# Patient Record
Sex: Male | Born: 1960
Health system: Southern US, Community
[De-identification: ages and names within clinical notes are randomized; demographics above are authoritative.]

## PROBLEM LIST (undated history)

## (undated) DIAGNOSIS — Z8719 Personal history of other diseases of the digestive system: Secondary | ICD-10-CM

## (undated) DIAGNOSIS — E782 Mixed hyperlipidemia: Secondary | ICD-10-CM

## (undated) DIAGNOSIS — E669 Obesity, unspecified: Secondary | ICD-10-CM

## (undated) DIAGNOSIS — Z9289 Personal history of other medical treatment: Secondary | ICD-10-CM

## (undated) DIAGNOSIS — E119 Type 2 diabetes mellitus without complications: Secondary | ICD-10-CM

## (undated) DIAGNOSIS — E8881 Metabolic syndrome: Secondary | ICD-10-CM

## (undated) DIAGNOSIS — G4733 Obstructive sleep apnea (adult) (pediatric): Secondary | ICD-10-CM

## (undated) DIAGNOSIS — I2510 Atherosclerotic heart disease of native coronary artery without angina pectoris: Secondary | ICD-10-CM

## (undated) DIAGNOSIS — M109 Gout, unspecified: Secondary | ICD-10-CM

## (undated) DIAGNOSIS — M545 Low back pain, unspecified: Secondary | ICD-10-CM

## (undated) DIAGNOSIS — F329 Major depressive disorder, single episode, unspecified: Secondary | ICD-10-CM

## (undated) DIAGNOSIS — G8929 Other chronic pain: Secondary | ICD-10-CM

## (undated) DIAGNOSIS — I10 Essential (primary) hypertension: Secondary | ICD-10-CM

## (undated) DIAGNOSIS — M1990 Unspecified osteoarthritis, unspecified site: Secondary | ICD-10-CM

## (undated) DIAGNOSIS — F32A Depression, unspecified: Secondary | ICD-10-CM

## (undated) HISTORY — PX: FUNCTIONAL ENDOSCOPIC SINUS SURGERY: SUR616

## (undated) HISTORY — DX: Personal history of other diseases of the digestive system: Z87.19

## (undated) HISTORY — DX: Obesity, unspecified: E66.9

## (undated) HISTORY — PX: SHOULDER ARTHROSCOPY: SHX128

## (undated) HISTORY — PX: CARPAL TUNNEL RELEASE: SHX101

## (undated) HISTORY — DX: Obstructive sleep apnea (adult) (pediatric): G47.33

## (undated) SURGERY — Surgical Case
Anesthesia: *Unknown

---

## 1998-02-28 ENCOUNTER — Ambulatory Visit (HOSPITAL_BASED_OUTPATIENT_CLINIC_OR_DEPARTMENT_OTHER): Admission: RE | Admit: 1998-02-28 | Discharge: 1998-02-28 | Payer: Self-pay | Admitting: Orthopaedic Surgery

## 1998-05-14 ENCOUNTER — Emergency Department (HOSPITAL_COMMUNITY): Admission: EM | Admit: 1998-05-14 | Discharge: 1998-05-14 | Payer: Self-pay | Admitting: Emergency Medicine

## 1999-05-18 HISTORY — PX: KNEE ARTHROSCOPY: SHX127

## 2001-09-22 ENCOUNTER — Emergency Department (HOSPITAL_COMMUNITY): Admission: EM | Admit: 2001-09-22 | Discharge: 2001-09-22 | Payer: Self-pay | Admitting: *Deleted

## 2001-09-30 ENCOUNTER — Ambulatory Visit (HOSPITAL_COMMUNITY): Admission: RE | Admit: 2001-09-30 | Discharge: 2001-09-30 | Payer: Self-pay | Admitting: Internal Medicine

## 2001-10-29 ENCOUNTER — Encounter: Admission: RE | Admit: 2001-10-29 | Discharge: 2001-10-29 | Payer: Self-pay | Admitting: Otolaryngology

## 2001-10-29 ENCOUNTER — Encounter: Payer: Self-pay | Admitting: Otolaryngology

## 2001-11-13 ENCOUNTER — Encounter (INDEPENDENT_AMBULATORY_CARE_PROVIDER_SITE_OTHER): Payer: Self-pay | Admitting: Specialist

## 2001-11-13 ENCOUNTER — Ambulatory Visit (HOSPITAL_BASED_OUTPATIENT_CLINIC_OR_DEPARTMENT_OTHER): Admission: RE | Admit: 2001-11-13 | Discharge: 2001-11-13 | Payer: Self-pay | Admitting: Otolaryngology

## 2002-05-17 ENCOUNTER — Encounter: Payer: Self-pay | Admitting: Emergency Medicine

## 2002-05-17 ENCOUNTER — Emergency Department (HOSPITAL_COMMUNITY): Admission: EM | Admit: 2002-05-17 | Discharge: 2002-05-18 | Payer: Self-pay | Admitting: Emergency Medicine

## 2003-08-29 ENCOUNTER — Ambulatory Visit (HOSPITAL_BASED_OUTPATIENT_CLINIC_OR_DEPARTMENT_OTHER): Admission: RE | Admit: 2003-08-29 | Discharge: 2003-08-29 | Payer: Self-pay | Admitting: Orthopedic Surgery

## 2003-08-29 ENCOUNTER — Ambulatory Visit (HOSPITAL_COMMUNITY): Admission: RE | Admit: 2003-08-29 | Discharge: 2003-08-29 | Payer: Self-pay | Admitting: Orthopedic Surgery

## 2004-05-02 ENCOUNTER — Inpatient Hospital Stay (HOSPITAL_COMMUNITY): Admission: EM | Admit: 2004-05-02 | Discharge: 2004-05-11 | Payer: Self-pay | Admitting: Emergency Medicine

## 2004-05-04 ENCOUNTER — Encounter (INDEPENDENT_AMBULATORY_CARE_PROVIDER_SITE_OTHER): Payer: Self-pay | Admitting: Specialist

## 2004-05-08 ENCOUNTER — Ambulatory Visit: Payer: Self-pay | Admitting: Infectious Diseases

## 2004-05-14 ENCOUNTER — Ambulatory Visit (HOSPITAL_COMMUNITY): Admission: RE | Admit: 2004-05-14 | Discharge: 2004-05-14 | Payer: Self-pay | Admitting: General Surgery

## 2004-12-18 ENCOUNTER — Ambulatory Visit (HOSPITAL_BASED_OUTPATIENT_CLINIC_OR_DEPARTMENT_OTHER): Admission: RE | Admit: 2004-12-18 | Discharge: 2004-12-18 | Payer: Self-pay | Admitting: Orthopaedic Surgery

## 2005-08-23 ENCOUNTER — Ambulatory Visit (HOSPITAL_BASED_OUTPATIENT_CLINIC_OR_DEPARTMENT_OTHER): Admission: RE | Admit: 2005-08-23 | Discharge: 2005-08-23 | Payer: Self-pay | Admitting: Otolaryngology

## 2005-08-23 ENCOUNTER — Ambulatory Visit (HOSPITAL_COMMUNITY): Admission: RE | Admit: 2005-08-23 | Discharge: 2005-08-23 | Payer: Self-pay | Admitting: Otolaryngology

## 2005-08-23 ENCOUNTER — Encounter (INDEPENDENT_AMBULATORY_CARE_PROVIDER_SITE_OTHER): Payer: Self-pay | Admitting: Specialist

## 2006-05-17 HISTORY — PX: LUMBAR LAMINECTOMY: SHX95

## 2006-05-20 ENCOUNTER — Ambulatory Visit (HOSPITAL_COMMUNITY): Admission: RE | Admit: 2006-05-20 | Discharge: 2006-05-21 | Payer: Self-pay | Admitting: Orthopedic Surgery

## 2006-05-20 ENCOUNTER — Encounter (INDEPENDENT_AMBULATORY_CARE_PROVIDER_SITE_OTHER): Payer: Self-pay | Admitting: Specialist

## 2006-06-26 ENCOUNTER — Inpatient Hospital Stay (HOSPITAL_COMMUNITY): Admission: AD | Admit: 2006-06-26 | Discharge: 2006-07-03 | Payer: Self-pay | Admitting: Internal Medicine

## 2006-10-28 ENCOUNTER — Ambulatory Visit (HOSPITAL_BASED_OUTPATIENT_CLINIC_OR_DEPARTMENT_OTHER): Admission: RE | Admit: 2006-10-28 | Discharge: 2006-10-28 | Payer: Self-pay | Admitting: Orthopaedic Surgery

## 2007-04-01 ENCOUNTER — Encounter: Admission: RE | Admit: 2007-04-01 | Discharge: 2007-04-02 | Payer: Self-pay | Admitting: Endocrinology

## 2007-08-19 ENCOUNTER — Encounter: Admission: RE | Admit: 2007-08-19 | Discharge: 2007-08-19 | Payer: Self-pay | Admitting: Orthopedic Surgery

## 2007-08-23 ENCOUNTER — Encounter: Admission: RE | Admit: 2007-08-23 | Discharge: 2007-08-23 | Payer: Self-pay | Admitting: Orthopedic Surgery

## 2007-09-04 ENCOUNTER — Encounter: Admission: RE | Admit: 2007-09-04 | Discharge: 2007-09-04 | Payer: Self-pay | Admitting: Orthopedic Surgery

## 2007-09-07 ENCOUNTER — Inpatient Hospital Stay (HOSPITAL_COMMUNITY): Admission: EM | Admit: 2007-09-07 | Discharge: 2007-09-11 | Payer: Self-pay | Admitting: Emergency Medicine

## 2007-10-22 ENCOUNTER — Inpatient Hospital Stay (HOSPITAL_COMMUNITY): Admission: AD | Admit: 2007-10-22 | Discharge: 2007-10-28 | Payer: Self-pay | Admitting: Endocrinology

## 2009-06-14 ENCOUNTER — Encounter: Admission: RE | Admit: 2009-06-14 | Discharge: 2009-06-14 | Payer: Self-pay | Admitting: *Deleted

## 2009-07-17 HISTORY — PX: POSTERIOR LUMBAR FUSION: SHX6036

## 2009-07-26 ENCOUNTER — Inpatient Hospital Stay (HOSPITAL_COMMUNITY): Admission: RE | Admit: 2009-07-26 | Discharge: 2009-07-29 | Payer: Self-pay | Admitting: *Deleted

## 2009-07-26 ENCOUNTER — Ambulatory Visit: Payer: Self-pay | Admitting: Cardiovascular Disease

## 2010-08-27 ENCOUNTER — Encounter
Admission: RE | Admit: 2010-08-27 | Discharge: 2010-08-27 | Payer: Self-pay | Source: Home / Self Care | Attending: Orthopedic Surgery | Admitting: Orthopedic Surgery

## 2010-12-19 LAB — CBC
HCT: 34.6 % — ABNORMAL LOW (ref 39.0–52.0)
HCT: 34.7 % — ABNORMAL LOW (ref 39.0–52.0)
HCT: 42 % (ref 39.0–52.0)
Hemoglobin: 12.3 g/dL — ABNORMAL LOW (ref 13.0–17.0)
Hemoglobin: 12.3 g/dL — ABNORMAL LOW (ref 13.0–17.0)
Hemoglobin: 15.3 g/dL (ref 13.0–17.0)
MCHC: 35.4 g/dL (ref 30.0–36.0)
MCHC: 35.5 g/dL (ref 30.0–36.0)
MCHC: 36.5 g/dL — ABNORMAL HIGH (ref 30.0–36.0)
MCV: 89.5 fL (ref 78.0–100.0)
MCV: 90 fL (ref 78.0–100.0)
MCV: 90.8 fL (ref 78.0–100.0)
Platelets: 156 10*3/uL (ref 150–400)
Platelets: 163 10*3/uL (ref 150–400)
Platelets: 203 10*3/uL (ref 150–400)
RBC: 3.83 MIL/uL — ABNORMAL LOW (ref 4.22–5.81)
RBC: 3.84 MIL/uL — ABNORMAL LOW (ref 4.22–5.81)
RBC: 4.69 MIL/uL (ref 4.22–5.81)
RDW: 13.5 % (ref 11.5–15.5)
RDW: 13.5 % (ref 11.5–15.5)
RDW: 13.7 % (ref 11.5–15.5)
WBC: 11.7 10*3/uL — ABNORMAL HIGH (ref 4.0–10.5)
WBC: 8.8 10*3/uL (ref 4.0–10.5)
WBC: 9.8 10*3/uL (ref 4.0–10.5)

## 2010-12-19 LAB — URINE MICROSCOPIC-ADD ON

## 2010-12-19 LAB — CARDIAC PANEL(CRET KIN+CKTOT+MB+TROPI)
CK, MB: 39.9 ng/mL — ABNORMAL HIGH (ref 0.3–4.0)
CK, MB: 52.7 ng/mL — ABNORMAL HIGH (ref 0.3–4.0)
CK, MB: 67.1 ng/mL — ABNORMAL HIGH (ref 0.3–4.0)
Relative Index: 0.5 (ref 0.0–2.5)
Relative Index: 0.5 (ref 0.0–2.5)
Relative Index: 0.7 (ref 0.0–2.5)
Total CK: 10477 U/L — ABNORMAL HIGH (ref 7–232)
Total CK: 8235 U/L — ABNORMAL HIGH (ref 7–232)
Total CK: 9882 U/L — ABNORMAL HIGH (ref 7–232)
Troponin I: 0.02 ng/mL (ref 0.00–0.06)
Troponin I: 0.03 ng/mL (ref 0.00–0.06)
Troponin I: 0.03 ng/mL (ref 0.00–0.06)

## 2010-12-19 LAB — POCT I-STAT 4, (NA,K, GLUC, HGB,HCT)
Glucose, Bld: 219 mg/dL — ABNORMAL HIGH (ref 70–99)
Glucose, Bld: 231 mg/dL — ABNORMAL HIGH (ref 70–99)
HCT: 32 % — ABNORMAL LOW (ref 39.0–52.0)
HCT: 33 % — ABNORMAL LOW (ref 39.0–52.0)
Hemoglobin: 10.9 g/dL — ABNORMAL LOW (ref 13.0–17.0)
Hemoglobin: 11.2 g/dL — ABNORMAL LOW (ref 13.0–17.0)
Potassium: 4.7 mEq/L (ref 3.5–5.1)
Potassium: 4.9 mEq/L (ref 3.5–5.1)
Sodium: 133 mEq/L — ABNORMAL LOW (ref 135–145)
Sodium: 134 mEq/L — ABNORMAL LOW (ref 135–145)

## 2010-12-19 LAB — COMPREHENSIVE METABOLIC PANEL
ALT: 43 U/L (ref 0–53)
AST: 35 U/L (ref 0–37)
Albumin: 3.5 g/dL (ref 3.5–5.2)
Alkaline Phosphatase: 45 U/L (ref 39–117)
BUN: 16 mg/dL (ref 6–23)
CO2: 24 mEq/L (ref 19–32)
Calcium: 9.4 mg/dL (ref 8.4–10.5)
Chloride: 105 mEq/L (ref 96–112)
Creatinine, Ser: 0.89 mg/dL (ref 0.4–1.5)
GFR calc Af Amer: >60 mL/min (ref >60)
GFR calc non Af Amer: >60 mL/min (ref >60)
Glucose, Bld: 172 mg/dL — ABNORMAL HIGH (ref 70–99)
Potassium: 4.2 mEq/L (ref 3.5–5.1)
Sodium: 139 mEq/L (ref 135–145)
Total Bilirubin: 0.7 mg/dL (ref 0.3–1.2)
Total Protein: 6.7 g/dL (ref 6.0–8.3)

## 2010-12-19 LAB — GLUCOSE, CAPILLARY
Glucose-Capillary: 104 mg/dL — ABNORMAL HIGH (ref 70–99)
Glucose-Capillary: 135 mg/dL — ABNORMAL HIGH (ref 70–99)
Glucose-Capillary: 136 mg/dL — ABNORMAL HIGH (ref 70–99)
Glucose-Capillary: 151 mg/dL — ABNORMAL HIGH (ref 70–99)
Glucose-Capillary: 155 mg/dL — ABNORMAL HIGH (ref 70–99)
Glucose-Capillary: 164 mg/dL — ABNORMAL HIGH (ref 70–99)
Glucose-Capillary: 172 mg/dL — ABNORMAL HIGH (ref 70–99)
Glucose-Capillary: 175 mg/dL — ABNORMAL HIGH (ref 70–99)
Glucose-Capillary: 179 mg/dL — ABNORMAL HIGH (ref 70–99)
Glucose-Capillary: 188 mg/dL — ABNORMAL HIGH (ref 70–99)
Glucose-Capillary: 194 mg/dL — ABNORMAL HIGH (ref 70–99)
Glucose-Capillary: 195 mg/dL — ABNORMAL HIGH (ref 70–99)
Glucose-Capillary: 198 mg/dL — ABNORMAL HIGH (ref 70–99)
Glucose-Capillary: 207 mg/dL — ABNORMAL HIGH (ref 70–99)
Glucose-Capillary: 210 mg/dL — ABNORMAL HIGH (ref 70–99)
Glucose-Capillary: 216 mg/dL — ABNORMAL HIGH (ref 70–99)
Glucose-Capillary: 226 mg/dL — ABNORMAL HIGH (ref 70–99)
Glucose-Capillary: 260 mg/dL — ABNORMAL HIGH (ref 70–99)

## 2010-12-19 LAB — URINALYSIS, ROUTINE W REFLEX MICROSCOPIC
Bilirubin Urine: NEGATIVE
Glucose, UA: NEGATIVE mg/dL
Hgb urine dipstick: NEGATIVE
Ketones, ur: NEGATIVE mg/dL
Leukocytes, UA: NEGATIVE
Nitrite: NEGATIVE
Protein, ur: 100 mg/dL — AB
Specific Gravity, Urine: 1.019 (ref 1.005–1.030)
Urobilinogen, UA: 0.2 mg/dL (ref 0.0–1.0)
pH: 5 (ref 5.0–8.0)

## 2010-12-19 LAB — DIFFERENTIAL
Basophils Absolute: 0.1 10*3/uL (ref 0.0–0.1)
Basophils Relative: 1 % (ref 0–1)
Eosinophils Absolute: 0.1 10*3/uL (ref 0.0–0.7)
Eosinophils Relative: 1 % (ref 0–5)
Lymphocytes Relative: 31 % (ref 12–46)
Lymphs Abs: 2.7 10*3/uL (ref 0.7–4.0)
Monocytes Absolute: 0.7 10*3/uL (ref 0.1–1.0)
Monocytes Relative: 7 % (ref 3–12)
Neutro Abs: 5.2 10*3/uL (ref 1.7–7.7)
Neutrophils Relative %: 60 % (ref 43–77)

## 2010-12-19 LAB — PROTIME-INR
INR: 0.99 (ref 0.00–1.49)
Prothrombin Time: 13 seconds (ref 11.6–15.2)

## 2010-12-19 LAB — BASIC METABOLIC PANEL
BUN: 14 mg/dL (ref 6–23)
CO2: 27 mEq/L (ref 19–32)
Calcium: 7.4 mg/dL — ABNORMAL LOW (ref 8.4–10.5)
Chloride: 104 mEq/L (ref 96–112)
Creatinine, Ser: 0.82 mg/dL (ref 0.4–1.5)
GFR calc Af Amer: >60 mL/min (ref >60)
GFR calc non Af Amer: >60 mL/min (ref >60)
Glucose, Bld: 164 mg/dL — ABNORMAL HIGH (ref 70–99)
Potassium: 3.9 mEq/L (ref 3.5–5.1)
Sodium: 139 mEq/L (ref 135–145)

## 2010-12-19 LAB — ABO/RH: ABO/RH(D): A NEG

## 2010-12-19 LAB — APTT: aPTT: 28 seconds (ref 24–37)

## 2010-12-19 LAB — TYPE AND SCREEN
ABO/RH(D): A NEG
Antibody Screen: NEGATIVE

## 2011-01-29 NOTE — Consult Note (Signed)
Alec [name], OKANE NO.:  [id number]   MEDICAL RECORD NO.:  [id number]          PATIENT TYPE:  INP   LOCATION:  6703                         FACILITY:  [facility]   PHYSICIAN:  Graylin Shiver, M.D.   DATE OF BIRTH:  May 18, 1961   DATE OF CONSULTATION:  09/08/2007  DATE OF DISCHARGE:                                 CONSULTATION   REASON FOR CONSULTATION:  The patient is a 50 year old male admitted to  the hospital because of abdominal pain, some nausea and vomiting.  He  went to the emergency room where he was evaluated and a CT scan showed  that he had acute pancreatitis with probable secondary inflammatory  changes in the duodenum. The patient denied drinking alcohol on a  regular basis.  His last consumption of alcohol was 2 months ago. He  gives no history of gallstones.  He has had similar presentations in the  past with pancreatitis and of note also is that his triglyceride level  on this presentation to the emergency room was 5000.   He had a similar episode in 2007 and also in 2005.   The patient has history of type 2 diabetes mellitus, hyperlipidemia,  gout, hypertension, obesity. He has been on Tricor for his  hyperlipidemia.   ALLERGIES:  None known.   MEDICATIONS PRIOR TO ADMISSION:  Altace, baby aspirin, allopurinol, fish  oil, Lexapro, Lantus, NovoLog, Tricor, Mobic, Skelaxin.   REVIEW OF SYSTEMS:  No chest pain or shortness of breath.   PHYSICAL EXAMINATION:  He does not appear in any acute distress.  VITAL SIGNS:  Stable.  Nonicteric.  HEART:  Regular rhythm.  No murmurs.  LUNGS:  Clear.  ABDOMEN:  Bowel sounds are normal.  It is soft.  There is tenderness in  the epigastrium and also right upper quadrant.  No rebound or guarding.   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   PLAN:  Supportive care, IV fluids, n.p.o. for now. Will obtain an  abdominal ultrasound just to double check and look for any  evidence of  gallstones.          ______________________________  Graylin Shiver, M.D.    SFG/MEDQ  D:  09/08/2007  T:  09/09/2007  Job:  [id number]   cc:   Geoffry Paradise, M.D.

## 2011-01-29 NOTE — H&P (Signed)
Alec, [name]                ACCOUNT NO.:  [id number]   MEDICAL RECORD NO.:  [id number]          PATIENT TYPE:  INP   LOCATION:  6703                         FACILITY:  [facility]   PHYSICIAN:  Geoffry Paradise, M.D.  DATE OF BIRTH:  06/28/1961   DATE OF ADMISSION:  09/07/2007  DATE OF DISCHARGE:                              HISTORY & PHYSICAL   CHIEF COMPLAINT:  Nausea, vomiting and abdominal pain.   HISTORY OF PRESENT ILLNESS:  [name] is a 50 year old gentleman with  longstanding diabetes mellitus type 2, obesity, hypertension,  hyperlipidemia, gout, presenting at this time with intractable nausea,  vomiting, abdominal pain.  He was last hospitalized in October, 2007  with a similar presentation felt compatible with pancreatitis secondary  to hyperlipidemia.  He evidently has had an extensive GI evaluation in  the past to include endoscopy which was negative.  He, as well, has had  a CT scan in the past which revealed some sort of retroperitoneal  process dating back to 2005.  His last episode quieted down with  analgesics, IV fluids and bowel rest.  He was seen in consultation by  [facility].  Most recently in the office he was seen August 12, 2007 at  which time his hemoglobin A1C was 7.  He has remained with significant  obesity, weight about 248 pounds.  His last lipid status revealed  triglycerides that were still in the 3,000 range and a cholesterol that  was 528;  this, despite aggressive dietary counseling and medications to  include Tricor.  In the emergency room he has had a CT scan and  laboratory testing all compatible with pancreatitis.  He is admitted for  further management.   PAST MEDICAL HISTORY:   ALLERGIES:  No known drug allergies.   MEDICATIONS:  1. Altace 10 mg daily.  2. Baby aspirin 81 mg daily.  3. Allopurinol 300 mg daily.  4. Fish oil daily.  5. Lexapro 10 mg daily.  6. Lantus 25 units subcutaneously q.h.s.  7. NovoLog 8 units subcutaneously  q.a.c.  8. Tricor 145 mg daily.  9. Mobic 15 mg daily.  10.Skelaxin 800 q.i.d.   MEDICAL ILLNESSES:  1. T12 traumatic compression fracture.  2. Diabetes mellitus type 2.  3. Hypertension.  4. Hyperlipidemia.  5. Gout.  6. Diabetic proteinuria.   PAST SURGICAL HISTORY:  1. Prior sinus surgery.  2. Multiple CT scans.  3. Upper GI endoscopy.  4. Multiple back surgeries as well.   FAMILY HISTORY:  Parents with diabetes.  Father with prostate cancer.   SOCIAL HISTORY:  Patient evidently does not smoke or drink.  He is  married.   PHYSICAL EXAMINATION:  VITAL SIGNS:  Temperature is 99.  Blood pressure  122/72. Pulse 90 and regular.  Respiratory rate is 18.  GENERAL APPEARANCE:  The patient is an obese-appearing male in mild  distress.  SKIN:  Warm, non-diaphoretic.  HEENT:  Anicteric.  Extraocular movements intact.  NECK:  No jugular venous distention or bruits.  LUNGS:  Clear to auscultation and percussion.  CARDIOVASCULAR:  Mild tachycardia.  Regular rate  and rhythm.  No  murmurs, rubs or gallops or heaves.  ABDOMEN:  Soft with diffuse upper abdominal tenderness, good bowel  sounds, no rebound.  EXTREMITIES:  No cyanosis, clubbing or edema.  Intact distal pulses.  NEUROLOGICAL:  He is intact.   ASSESSMENT:  1. Abdominal pain with nausea and vomiting, compatible with      triglycerides-related pancreatitis.  Pattern well established.  2. Diabetes mellitus type 2, stable.  3. Hypertension, stable.  4. Hyperlipidemia.  Multiple interventions in the past and failing      dietary interventions.   PLAN:  We will once again admit for bowel rest, IV fluids and  analgesics.  Further pending his course.  As stated above, he has  already had significant GI work-ups in the past with dietary  interventions with little success.  We will continue him on his medical  management and further pending his course.           ______________________________  Geoffry Paradise, M.D.      RA/MEDQ  D:  09/07/2007  T:  09/08/2007  Job:  [id number]

## 2011-01-29 NOTE — Consult Note (Signed)
Alec [name], Alec [name]                ACCOUNT NO.:  [id number]   MEDICAL RECORD NO.:  [id number]          PATIENT TYPE:  INP   LOCATION:  5155                         FACILITY:  [facility]   PHYSICIAN:  John C. Madilyn Fireman, M.D.    DATE OF BIRTH:  Sep 27, 1960   DATE OF CONSULTATION:  10/24/2007  DATE OF DISCHARGE:                                 CONSULTATION   REFERRING PHYSICIAN:  Tera Mater. Evlyn Kanner, M.D.   HISTORY OF ILLNESS:  The patient is a 50 year old white male admitted  for the second time  in 2 months and fourth time in 3 years with stereotypical abdominal  pain, nausea and vomiting with retroperitoneal and peripancreatic  inflammation of various degrees on CT scan. He has had normal amylases  throughout and occasionally mildly elevated lipase no greater than 82  during these presentations but has repeatedly had elevated triglycerides  greater than 3000 or more with presumed diagnosis being  hypertriglyceride related pancreatitis.  He was currently admitted on  February 5 and on this occasion CT scan was described as showing  peripancreatic inflammation around the neck and head with some secondary  inflammatory change around the first, second and third portions of the  duodenum.  Small reactive lymph nodes were seen around the gastrohepatic  ligament.  The liver was diffusely enlarged which has been seen on  previous CT.  The patient has not had any vomiting or fever and his  white blood cell count has been normal.  Liver function tests have  likewise been normal.  He has had gallbladder ultrasounds in the past  which have been negative.  He has been a sporadic alcohol drinker but  denies drinking any at all since his last admission in December.  On  another admission in 2007 he had some secondary inflammation around the  duodenum and an EGD was done to rule out penetrating ulcer that showed  only showed mild gastritis.   PAST MEDICAL HISTORY:  1. Type 2 diabetes.  2. Obesity.  3.  Hypertension.  4. Gout.  5. Hyperlipidemia.   MEDICATIONS:  1. Altace.  2. Baby aspirin.  3. Allopurinol,  4. Lexapro.  5. Lantus insulin.  6. Tricor.  7. Mobic.  8. Skelaxin.   PAST SURGICAL HISTORY:  Previous sinus surgery.   SOCIAL HISTORY:  The patient denies alcohol or tobacco use.   FAMILY HISTORY:  Father has prostate cancer.  Both parents have  diabetes.   PHYSICAL EXAM:  GENERAL:  Moderately obese white male in no acute  distress.  HEART:  Regular rate and rhythm without murmurs.  LUNGS:  Clear.  ABDOMEN:  Soft, slightly distended with normoactive bowel sounds.  No  hepatosplenomegaly, mass or guarding.   IMPRESSION:  Abdominal pain with peripancreatic inflammatory  abnormalities with overall presentation not classic for pancreatitis but  with the presumed etiology for elevated triglycerides.   PLAN:  Continue supportive care for now.  We will screen for autoimmune  hepatitis with serum IgG subtype.  Due to the recurrent nature of these  presentations at this point, I would favor an elective  referral to the  GI department of tertiary care center to see if there is any need for  evaluating pancreatic ductal abnormality invasively with ERCP.  Will  follow with you.           ______________________________  Everardo All. Madilyn Fireman, M.D.     JCH/MEDQ  D:  10/24/2007  T:  10/26/2007  Job:  [id number]   cc:   Jeannett Senior A. Evlyn Kanner, M.D.

## 2011-01-29 NOTE — H&P (Signed)
Alec [name], [name]                ACCOUNT NO.:  [id number]   MEDICAL RECORD NO.:  [id number]          PATIENT TYPE:  INP   LOCATION:  5125                         FACILITY:  [facility]   PHYSICIAN:  Tera Mater. [place], M.D. DATE OF BIRTH:  10-17-1960   DATE OF ADMISSION:  10/22/2007  DATE OF DISCHARGE:                              HISTORY & PHYSICAL   HISTORY:  Alec [name] is a 50 year old white male with a history of  prior pancreatitis, type 2 diabetes, goiter, proteinuria, and  hyperlipidemia.  He started feeling poorly, yesterday, and slept  somewhat intermittently last night.  He had a vague abdominal pain this  morning.  This progressively worsened during the day today.  He did last  eat lunch about 5 hours ago with some left over [place] that did not  specifically it make it much worse, but did not make it any better.  His  bowels were somewhat loose this morning.  He has intermittent trouble  with a shortness of breath due to pain.  The pain does radiate from his  epigastrium back through to his back and between his shoulder blades.  No lower abdominal pain is noted.  No urinary symptoms were noted.  No  cardiac chest pain is noted.  He has no headaches or dizziness.  He has  had no fevers, chills, or sweats.  He has had no feeling of being hot.  He has had no unusual travel or exposures.  He was hospitalized a few  months ago with similar kind of issues.  This is very similar in  presentation to his prior problems.  His blood sugar was 190 at the last  check.   MEDICATIONS INCLUDE:  1. Altace 10.  2. Baby aspirin daily.  3. Allopurinol 300.  4. Fish oil.  5. Lovaza 4 grams a day.  6. Lexapro 10.  7. A sliding-scale of NovoLog 8 units 3x a day.  8. Lantus 28 units at bed.  9. Tricor 145.   ALLERGIES:  None.   PAST MEDICAL HISTORY INCLUDES:  1. A T12 compression fracture that was traumatic.  2. He has had pancreatitis in 2007 and December 2008.  3. He has had type 2  diabetes.  4. Hypertension.  5. Hyperlipidemia.  6. Gout.  7. Proteinuria with workup by renal.  8. He has had left ventricular hypertrophy.  9. A small goiter.  10.He has an unusual retroperitoneal process with multiple CTs in      2005.  11.He had a normal Cardiolite in 2003.  12.An echo in 2003.  13.A CT of the head in 2006.  14.He has previously had knee surgery twice.  15.Sinus surgery and deviated septum.  16.Nasal polyp surgery.  17.He has also had carpal tunnel release on the right.   SOCIAL HISTORY:  The patient is married with two children.  He is a  nonsmoker and not currently drinking at all.   PHYSICAL EXAMINATION:  VITAL SIGNS:  On exam here in the office, blood  pressure is 120/90; pulse is 84 and regular; weight was 243 which is up  1 pound.  Temperature is 99 degrees.  His blood sugar will be reported.  GENERAL:  We have a white male sitting up in at least mild-to-moderate  distress.  He is intermittently grunting with pain spasms.  HEENT:  His sclerae are anicteric.  Extraocular movements are intact.  Face is symmetrical.  Mucous membranes are moist.  No pharyngeal lesions  are present.  NECK:  Supple without JVD or bruits.  I cannot appreciate any  thyromegaly.  LUNGS:  Clear without wheezes, rales or rhonchi.  He is moving most  moving air well.  HEART:  Regular and slightly tachycardic with no murmur.  ABDOMEN:  Quite a bit tender especially in the epigastrium.  Just with  laying the stethoscope he winces.  Lower quadrants are less tender.  There is no rebound in the lower quadrants.  EXTREMITIES:  Show pulses to be strong with no peripheral edema.  NEUROLOGIC EXAM:  He is awake, alert.  His mentation is perfectly well.  Speech is clear.  No resting tremors present.  He is uncomfortable.   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  The clinical history and exam are  quite suggestive of this.   He is going to be a direct admit with an  inpatient status, with a diagnosis of pancreatitis to a medical floor.  We will make him n.p.o.  We will give him saline starting at 125 an hour  until we get out labs back.  He is going to have STAT lipase, amylase,  CMET, CBC, and lipid profile.  We will also check an abdominal CT with  and without contrast, and see if we can delineate this process further.  He will be given morphine IV q.4 h. for pain with Phenergan added.  His  Lexapro will be on hold.  We are probably going to put him on an insulin  drip overnight just trying to bring his lipids down which, I think, are  the proximal cause of this, as they have been previously.  The patient's  clinical course will determine other worse.  At the present, I am not  going to get gastroenterology or surgery involved; since, I think, we  can handle this at the present.           ______________________________  Tera Mater. Evlyn Kanner, M.D.     SAS/MEDQ  D:  10/22/2007  T:  10/23/2007  Job:  [id number]

## 2011-02-01 NOTE — Op Note (Signed)
NAME:  TYCEN, DOCKTER                          ACCOUNT NO.:  [id number]   MEDICAL RECORD NO.:  [id number]                   PATIENT TYPE:  AMB   LOCATION:  DSC                                  FACILITY:  [facility]   PHYSICIAN:  Feliberto Gottron. Turner Daniels, M.D.                DATE OF BIRTH:  02-07-1961   DATE OF PROCEDURE:  08/29/2003  DATE OF DISCHARGE:                                 OPERATIVE REPORT   PREOPERATIVE DIAGNOSIS:  Right knee lateral meniscal tear with catching,  locking, and popping for two months.   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   PROCEDURES:  Right knee arthroscopic debridement of meniscal tears,  chondromalacia, and removal of loose bodies.   SURGEON:  Feliberto Gottron. Turner Daniels, M.D.   FIRST ASSISTANT:  Skip Mayer, P.A.-C.   ANESTHETIC:  General LMA.   ESTIMATED BLOOD LOSS:  Minimal.   FLUID REPLACEMENT:  800 cc of crystalloid.   DRAINS PLACED:  None.   TOURNIQUET TIME:  None.   INDICATIONS FOR PROCEDURE:  The patient is a 50 year old man who had  arthroscopic debridement of his left knee a few years ago and has had  similar problems now with the right knee for a couple of months that  culminated in what sounds like a locked knee a few days ago when he was  getting some Christmas tree lights down.  He has had catching, locking, and  popping for two months prior to that.  When I saw him in the office he was  exquisitely tender along the lateral joint line, had limited motion lacking  full extension of about 10 degrees and flexion was only to about 90 with  little if any effusion.  He was presumed to have a lateral meniscal tear  where he was most tender, although he is tender along the medial side as  well, and we decided to give him a couple of days to see if he got better.  When I saw him in the  office this morning, despite using Vicodin-ES, he  still had significant pain that prevented weightbearing, and he is taken for  arthroscopic evaluation and treatment of his right knee.  X-rays in the  office a few days ago were negative.   DESCRIPTION OF PROCEDURE:  The patient identified by arm band, taken to the  operating room at [facility].  Appropriate anesthetic monitors  were attached, and general LMA anesthesia induced with the patient in the  supine position.  Lateral post applied to the table and the right lower  extremity prepped and draped in the usual sterile fashion from the ankle to  the mid-thigh.  Using a #11 blade, standard inferomedial and inferolateral  parapatellar portals were then made allowing introduction  of the arthroscope  through the inferolateral portal and the outflow through the inferomedial  portal.  Diagnostic arthroscopy revealed grade 3 chondromalacia of the  patella, grade 3-4 of the trochlear, and these were debrided back to stable  margins using a 3.5 Gator sucker shaver and swapping portals during the  process a few times.  Moving into the medial compartment, complex tearing of  the posterior horn of the medial meniscus was noted.  This was removed with  straight biters, large and small, as well as the 3.5 Gator sucker shaver.  Minimal chondromalacia was noted to the tibial plateau on the medial femoral  condyle.  The ACL and the PCL were intact.  There were some cartilaginous  loose bodies taken out during the procedure as well from the trochlear donor  sites.  On the lateral side, degenerative tearing of the anterior and  posterior horns of the lateral meniscus was noted. and this was debrided  back to stable margins near the root.  We probed thoroughly, tugging on the  meniscus to make sure there were no peripheral tears, and the meniscus  overall was intact.  There was some grade 2-3 chondromalacia of the lateral  tibial condyle that  was also debrided.  At this point, the knee was washed  out with normal saline solution.  The arthroscopic instruments were removed,  and a dressing of Xerofoam, 4 x 4 dressing sponges, Webril, and an Ace wrap  applied.  The patient was then awakened and taken to the recovery room  without difficulty.                                               Feliberto Gottron. Turner Daniels, M.D.    Ovid Curd  D:  08/29/2003  T:  08/29/2003  Job:  [id number]

## 2011-02-01 NOTE — Consult Note (Signed)
NAME:  EZELL, POKE NO.:  [id number]   MEDICAL RECORD NO.:  [id number]                   PATIENT TYPE:  INP   LOCATION:  5731                                 FACILITY:  [facility]   PHYSICIAN:  Graylin Shiver, M.D.                DATE OF BIRTH:  [birth year]   DATE OF CONSULTATION:  05/04/2004  DATE OF DISCHARGE:                                   CONSULTATION   REFERRING PHYSICIAN:  Dr. Ollen Gross. Carolynne Edouard.   REASON FOR CONSULTATION:  This patient is a 50 year old male who was  admitted to the hospital on May 02, 2004 by Dr. Carolynne Edouard with complaint of  back pain and abdominal pain.  The patient had been experiencing pain in his  back for a couple of weeks.  The pain then went to the right side of his  abdomen and persisted.  He did not experience nausea or vomiting.  A CT scan  of the abdomen was done, which showed inflammation and stranding around the  duodenum which tracked into the retroperitoneum and down into the pelvic  area; the etiology of this was unclear.  The pancreas appeared normal on the  CT scan.  It was felt that the symptoms could be secondary to peptic ulcer  disease.  He was admitted to the hospital and he continues to have the pain  on the right side of the abdomen.   PAST HISTORY:   ALLERGIES:  PERCOCET.   MEDICATIONS:  He was on a medicine for diabetes and hypercholesterolemia.   PAST MEDICAL HISTORY:  1. Diabetes mellitus.  2. Hypercholesterolemia.  3. Proteinuria.   PREVIOUS SURGERIES:  He has had sinus surgery and knee surgery in the past.   SOCIAL HISTORY:  He does not smoke.  He rarely drinks alcohol.   SYSTEMS REVIEW:  He is not complaining of any anginal chest pains, shortness  of breath, cough or sputum production.   PHYSICAL:  GENERAL:  He does not appear in any acute distress at this time.  SKIN:  He is nonicteric.  NECK:  Neck is supple.  HEART:  Regular rhythm.  No murmurs.  LUNGS:  Lungs are clear.  ABDOMEN:  Bowel  sounds are normal.  It is soft.  There is tenderness in the  epigastrium and also in the right side of the abdomen, starting in the right  upper quadrant and extending down the right side of the abdomen and lateral  right wall of the abdomen.  No obvious hepatosplenomegaly is noted.   LABORATORIES:  White blood cell count 9500, hemoglobin 12.4, hematocrit  35.3, platelets 157,000.  Lipase initially on admission was mildly elevated  at 78.   IMPRESSION:  This patient is a 50 year old male with pain in the epigastric  and right side of his abdomen with findings on CT scan of inflammation and  stranding in the area of the right  anterior pararenal space,  retroperitoneum, and this seems to be extending from the duodenum.  It is  unclear whether this is being caused from peptic ulcer disease.  I suppose  other considerations could be ischemic.   PLAN:  I will proceed with EGD to evaluate the upper GI tract; I think the  patient can tolerate it.  He is tolerating clear liquids.  He is having  bowel movements.  Hopefully, an EGD will help to clarify what might be going  on in the region of the duodenum.  The procedure was explained, along with  the potential risks of bleeding, infection and perforation; he understands  and consents to the procedure.                                               Graylin Shiver, M.D.    SFG/MEDQ  D:  05/04/2004  T:  05/05/2004  Job:  [id number]   cc:   Ollen Gross. Vernell Morgans, M.D.  [address]  [place]  [place] [address]  Fax: [phone]

## 2011-02-01 NOTE — Discharge Summary (Signed)
Alec [name], Alec [name]                ACCOUNT NO.:  [id number]   MEDICAL RECORD NO.:  [id number]          PATIENT TYPE:  INP   LOCATION:  5742                         FACILITY:  [facility]   PHYSICIAN:  Ollen Gross. Vernell Morgans, M.D. DATE OF BIRTH:  10/15/1960   DATE OF ADMISSION:  05/02/2004  DATE OF DISCHARGE:  05/11/2004                                 DISCHARGE SUMMARY   HISTORY OF PRESENT ILLNESS:  [name] is a 50 year old gentleman who  presented initially with right abdominal and back pain.  He underwent a CT  scan that showed a small area of inflammation around the duodenum.  This  inflammation tracked into his retroperitoneum and down into his pelvis.  There was no evidence of free air, and his white count was elevated.  He was  admitted to the hospital with a tentative diagnosis of peptic ulcer disease  with possibly some contained perforation.  He was started on antibiotics and  bowel rest.  A GI consult was obtained.  He was a diabetic and his sugars  were watched closely.  Around May 06, 2004, he was started on a clear  liquid diet which he tolerated.  He was gradually advanced, and a repeat CT  scan was unchanged, if not a little bit improved.  He was gradually advanced  until he was switched to oral antibiotics, did well with this for a day, and  on May 11, 2004 was ready for discharge home.   DISCHARGE MEDICATIONS:  He was sent home on -  1.  Augmentin.  2.  Vicodin for pain.  3.  He was to resume his home medications.   DIET:  As tolerated.  Diabetic diet.   ACTIVITY:  As tolerated.   FOLLOW UP:  With Dr. Carolynne Edouard in 2 weeks.   CONDITION:  Stable.   FINAL DIAGNOSIS:  Retroperitoneal inflammation.   DISPOSITION:  He is discharged home.      Renae Fickle   PST/MEDQ  D:  08/14/2004  T:  08/14/2004  Job:  [id number]

## 2011-02-01 NOTE — H&P (Signed)
[place]. [facility]  Patient:    SIGISMUND, CROSS Visit Number: [id number] MRN: [id number]          Service Type: EMS Location: [facility] Attending Physician:  Corlis Leak. Admit Date:  09/22/2001 Discharge Date: 09/22/2001                           History and Physical  CHIEF COMPLAINT:  Right elbow pyoarthrosis.  HISTORY OF PRESENT ILLNESS:  This 50 year old man with a 4-day history of swelling and pain along the lateral aspect of his right elbow.  It got markedly worse 24 hours prior to admission with shaking night sweats before the day before his admission.  When he presented to the orthopedic clinic, he had a temperature of 100.7. We tapped his elbow and got out some yellowish fluid with an appearance consistent with pus.  This was sent off to the lab for culture, Gram stain, cell count, and crystals.  Unfortunately, the specimen was filed under a slightly different spelling of the name and because of a delay in getting the results of the fluid back he was admitted to the hospital for urgent irrigation and debridement of presumed pyoarthrosis of the elbow.  While he was in the OR preop holding area, we finally got the fluid back, 44,000 white cells with intracellular and extracellular calcium oxalate crystals which, of course, is gout.  At that point he was discharged from the hospital and he never actually went through the operating room.  PAST MEDICAL HISTORY:  He does not smoke.  Has an occasional alcohol.  He does eat red meat.  PREVIOUS SURGICAL HISTORY:  He had a knee injury taken care of by Dr. Jerl Santos some years ago.  MEDICATIONS:  No medications at the time of admission outside of over-the-counter medicines.  ALLERGIES:  No known allergies.  In the past, he has taken Lodine and two weeks ago he was taking some Zithromax for a bronchitis and came off of this four days prior to this admission.  FAMILY HISTORY:  He is employed  and lives in [place] with his wife.  PHYSICAL EXAMINATION:  GENERAL:  A well-nourished, well-developed, 50 year old man.  VITAL SIGNS:  Temperature 100.7, respiratory rate 16, blood pressure 138/75, heart rate 100.  HEENT:  Unremarkable.  NEUROLOGIC:  The right elbow is swollen, erythematous, and hot to touch.  He is neurovascularly intact.  LUNGS:  Clear.  HEART:  Regular rate and rhythm.  ABDOMEN:  Belly soft and nontender.  BACK:  Clinically straight.  EXTREMITIES:  Full range of motion of shoulders, other elbow, wrists, hips, knees, and ankles  LABORATORY:  His hemoglobin is 15.2, hematocrit 43.4, white count 13.5.  Basic metabolic profile was normal except for a chloride which is slightly low at 95.  Again, the fluid analysis came back as the patient was being taken from the preop holding area to the operating room, but he did not make it into the operating room.  ASSESSMENT:  Acute gout attack in a 50 year old man.  PLAN:  He is to be discharged home on Indocin SR 75 mg p.o. b.i.d. to follow up in my office in 2-3 days.  His surgery, of course, is cancelled. Attending Physician:  Corlis Leak DD:  10/10/97 TD:  10/10/97 Job: [id number]

## 2011-02-01 NOTE — Discharge Summary (Signed)
Alec [name], Alec [name]                ACCOUNT NO.:  [id number]   MEDICAL RECORD NO.:  [id number]          PATIENT TYPE:  INP   LOCATION:  5127                         FACILITY:  [facility]   PHYSICIAN:  Tera Mater. Evlyn Kanner, M.D. DATE OF BIRTH:  1960-11-11   DATE OF ADMISSION:  06/26/2006  DATE OF DISCHARGE:  07/03/2006                                 DISCHARGE SUMMARY   DISCHARGE DIAGNOSES:  1. Pancreatitis, presumed secondary to hypertriglyceridemia.  2. Significant hypertriglyceridemia now clinically much improved.  3. Type 2 diabetes with generally fair control at the present time.  4. Depression with medication resumed.  5. Normocytic anemia.  6. Mild hyponatremia, resolved.  7. Mild hypokalemia, improved.   CONSULTATIONS:  Dr. Vida Rigger of gastroenterology.   PROCEDURES:  Included a CT of the abdomen and pelvis.   [name] is a 50 year old white male followed by me for several years  with a combination of problems with type 2 diabetes, significant  dyslipidemia and depressive problem.  He also had an unusual past history of  retroperitoneal inflammation with a hospitalization a couple of years ago.  He presented with abdominal pain to my partner on June 27, 2006 with  nausea, vomiting, abdominal pain and a picture most consistent with  pancreatitis, presumed secondary to his severe hypertriglyceridemia.  His  recent triglycerides had been over 9,000.  The patient was put at bowel rest  and treated with insulin.  Fortunately, this dramatically reduced his  triglycerides in a very short period of time.  His diabetes was controlled  with insulin which also helped clear the lipids.  His blood pressure has  done relatively well while here.  He has been slow to improve.  His  abdominal pain has been fluctuating on-and-off.  His diet has been slowly  advanced.  At the present time, we have now had 36 to 48 hours of afebrile  time without significant pain issues.  It is a bit unusual  that his  pancreatic enzymes were never substantially elevated, but both me and the  consultants clearly agree that this is related to his triglycerides.  At the  moment, he had a reasonable night.  He slept well.  He has had a mild  nonproductive cough which is reduced.  It seems to be allergic related.  He  has been relatively stable in other ways.   LAB DATA:  Today, his white count is 4,100, hemoglobin 10.4, platelets  295,000, MCV 90.5.  On the 17th, sodium 136, potassium 3.2, chloride 105,  CO2 26, BUN 4, creatinine 0.8, glucose 188, total bili .8, alk phos  __________ 32, SGOT 21, SGPT 21, total protein 5.5, albumin 1.9, calcium  8.2, amylase 19, lipase 27.   Previous laboratories at presentation:  White count was 11,300 and it rose  as high as 17,800 on the second day, his initial hemoglobin was 12.8,  platelets were 239,000, initial sodium was 133, potassium 3.9, chloride 100,  CO2 24, BUN 7, creatinine 0.7, glucose 224, calcium 8.8, total protein 6.9,  albumin 3.3, on repeat his albumin has dropped as low  as 1.9, it was last  measured at that on the 17th.  His initial lipase was elevated at 67 with an  amylase of 47, repeats were 45 and 30 and then downward as noted above.  His  AST was mildly elevated on the 14th at 46, alk phos studies have been normal  throughout, total bili was abnormal on one occasion at 2.6.  CK was normal  at 106, lipid profile while at bowel rest and with the insulin, his total  cholesterol was 43, triglycerides 31, HDL 24, LDL of 13.  Urinalysis was  positive for 300 mg of protein, 1500 mg of ketones, 1.036 was specific  gravity.  Blood cultures on the 11th x2 were negative.   RADIOLOGY TESTING:  CT of the abdomen and pelvis on the 12th showed  subsegmental atelectasis in the lung bases, liver is diffuse __________  infiltrated with 30 cm in size, no focal abnormalities of spleen or stomach,  duodenal unremarkable, there is edema around the tail of  the pancreas which  appeared mildly edematous, the head and body of the pancreas were normal,  there was no ductal dilations, splenic vein and portal vein are patent, the  gallbladder __________  high attenuation suggesting gallbladder sludge,  adrenal glands and right kidney normal, there is a cyst in the left kidney.   A chest x-ray at presentation showed bibasilar atelectasis.   In summary, we have a 50 year old white male with known metabolic  dysfunction of lipids and sugar who presents with pancreatitis due to  extreme hypertriglyceridemia.  With bowel rest and insulin, his  triglycerides have remarkably improved.  At the present time, he is  continuing to make progress.  He will be transitioned to insulin to help  improve his lipid management as well.  He has been covered with antibiotics  while here, but has had no fever in a few days and, I believe, received a  full seven days and we will not continue these.  At discharge his  medications will include:  1. Lantus 25 units at bedtime.  2. A sliding-scale of NovoLog which will be written out which we will      start at a blood sugar of 100.  3. He will be on allopurinol 100 mg once daily.  4. He will be on Pancrease two tablets three times a day for an additional      three weeks.  5. He will be on Protonix 40 mg once daily.  6. He will be back on Lexapro 10 mg once daily.  7. He will be on potassium 40 mEq twice daily for two weeks.  8. He will get Vicodin one to two tablets q.4.   His other medications:  He will resume his Altace 10 daily, his baby aspirin  daily.  He will not be on his Avandamet.  He will not be on his Micardis.  He will be treated primarily with Tricor 145 mg once daily.  He will not be  back on his Vytorin at the present time.  His fish oil can be resumed in  about a week.   His diet is to be low fat, no concentrated sweets.  He is to follow up with Dr. Ewing Schlein in two to three weeks and imaging will be   per his discretion, he will see me in a week to two and he will let me know  if he has any fevers.  ______________________________  Tera Mater Evlyn Kanner, M.D.     SAS/MEDQ  D:  07/03/2006  T:  07/03/2006  Job:  [id number]

## 2011-02-01 NOTE — H&P (Signed)
NAME:  Alec [name], Alec [name]                          ACCOUNT NO.:  [id number]   MEDICAL RECORD NO.:  [id number]                   PATIENT TYPE:  EMS   LOCATION:  MAJO                                 FACILITY:  [facility]   PHYSICIAN:  Ollen Gross. Vernell Morgans, M.D.              DATE OF BIRTH:  1961-04-13   DATE OF ADMISSION:  05/02/2004  DATE OF DISCHARGE:                                HISTORY & PHYSICAL   Alec [name] is a 50 year old white male who presents tonight with a two-week  history of back pain he thought might be kidney pain which he has had in the  past.  Over the last two weeks, the pain has moved from his back to mostly  his right abdomen, and the pain has not gone away.  He has not really run  any fevers.  He denies any nausea or vomiting.  No chest pain, shortness of  breath, diarrhea, or dysuria.  He has had just a little bit of looser bowel  movements but no gross diarrhea.  The rest of his Review of Systems is  unremarkable.   PAST MEDICAL HISTORY:  1. Diabetes.  2. Hypercholesterolemia.  3. Proteinuria.   PAST SURGICAL HISTORY:  1. Two sinus surgeries.  2. Two knee surgeries.   MEDICATIONS:  Include a diabetic pill, cholesterol medicine.   ALLERGIES:  PERCOCET.   SOCIAL HISTORY:  He denies any history of tobacco and only occasionally  drinks alcohol.   FAMILY HISTORY:  Noncontributory.   PHYSICAL EXAMINATION:  VITAL SIGNS:  Blood pressure 140/80, pulse 94,  temperature 99.5.  GENERAL:  He is a well-developed, well-nourished white male in no acute  distress.  SKIN:  Warm and dry, no jaundice.  HEENT:  Eyes: Extraocular muscles intact.  Pupils equal, round, and reactive  to light.  Sclerae nonicteric.  LUNGS:  Clear bilaterally.  No use of accessory respiratory muscles.  HEART:  Regular rate and rhythm with an impulse in the chest.  ABDOMEN:  Soft with some moderate right-sided abdominal pain but no guarding  or peritoneal signs.  No palpable mass or  hepatosplenomegaly.  EXTREMITIES:  No cyanosis, clubbing, or edema.  PSYCHOLOGICAL:  Alert and oriented x 3 with no evidence of anxiety or  depression.   LABORATORY AND X-RAY DATA:  On review of his labs, it was significant for a  white count of 11,700.   CT scan showed a small area of inflammation around his duodenum, and the  inflammation from that area tracks in his retroperitoneum down to the level  of his pelvis.  There is no free air and no extravasation of the oral  contrast that he took.   IMPRESSION:  This is a 50 year old white male with what may be some peptic  ulcer disease and inflammation around the duodenum but no evidence of  perforation.   PLAN:  1. We will admit him for IV  antibiotics and bowel rest.  2. Monitor his white count, fever curves, and serial exams.  3. We will obtain a GI consult to see if he would benefit from endoscopy to     look at this area further.                                                Ollen Gross. Vernell Morgans, M.D.    PST/MEDQ  D:  05/02/2004  T:  05/02/2004  Job:  [id number]

## 2011-02-01 NOTE — Op Note (Signed)
[place]. [facility]  Patient:    Alec [name], Alec [name] Visit Number: [id number] MRN: [id number]          Service Type: DSU Location: [place] Attending Physician:  Carlean Purl Dictated by:   Kristine Garbe Ezzard Standing, M.D. Proc. Date: 11/13/01 Admit Date:  11/13/2001                             Operative Report  PREOPERATIVE DIAGNOSIS:  Chronic left maxillary sinus disease, left ethmoid sinus disease.  POSTOPERATIVE DIAGNOSIS:  Chronic left maxillary sinus disease, left ethmoid sinus disease.  OPERATION PERFORMED:  Functional endoscopic sinus surgery with left maxillary ostial enlargement and left anterior ethmoidectomy.  SURGEON:  Kristine Garbe. Ezzard Standing, M.D.  ANESTHESIA:  General endotracheal.  COMPLICATIONS:  None.  INDICATIONS FOR PROCEDURE:  The patient is a 50 year old gentleman who has had chronic problems with recurrent left maxillary, left cheek sinus disease.  He has had previous Caldwell-Luc surgery and septoplasty in 1996.  He has had recurrent infections every couple of months.  Most recently a CT scan of the sinus shows almost complete opacification of the left maxillary sinus with minimal left ethmoid disease.  He is taken to the operating room at this time for functional endoscopic sinus surgery with left ethmoidectomy and left maxillary ostial enlargement with drainage of disease.  DESCRIPTION OF PROCEDURE:  After adequate endotracheal anesthesia, the patient received 1 gm Ancef preoperatively. The nose was prepped with Betadine solution and draped out with sterile towels.  The left nasal cavity was prepped with cotton pledgets soaked in decongestant solution and the left middle meatus was injected with 6 cc of Xylocaine with epinephrine.  First using a sickle knife, the uncinate process was incised and was removed.  The maxillary ostia was enlarged with back biting and straight through-cut forceps. After widely opening the left  maxillary ostia there was really only minimal mucus.  No significant debris within the maxillary sinus and no polyps.  The sinus actually looked well to be clear.  The straight through-cuts and up through-cuts and microdebrider were used to open up the anterior ethmoid area on the left side and again minimal disease was found although there was a little bit of mucosal thickening.  This basically completed the procedure.  Nothing was done on the posterior left ethmoid region.  A Kennedy sinus pack was placed within the left middle meatus and hydrated with Xylocaine with epinephrine.  [name] was awakened from anesthesia and transferred to recovery room postoperatively doing well.  DISPOSITION:  [name] was discharged home on Keflex 500 mg b.i.d. for one week, Tylenol and Tylenol #3 p.r.n. pain.  Will have him follow up in my office in four days to have the Kennedy sinus pack removed. Dictated by:   Kristine Garbe Ezzard Standing, M.D. Attending Physician:  Carlean Purl DD:  11/13/01 TD:  11/13/01 Job: [id number]

## 2011-02-01 NOTE — Discharge Summary (Signed)
[name], Alec                ACCOUNT NO.:  [id number]   MEDICAL RECORD NO.:  [id number]          PATIENT TYPE:  OIB   LOCATION:  5029                         FACILITY:  [facility]   PHYSICIAN:  Nelda Severe, MD      DATE OF BIRTH:  1960-11-21   DATE OF ADMISSION:  05/20/2006  DATE OF DISCHARGE:                                 DISCHARGE SUMMARY   This 50 year old gentleman was admitted for management of his severe  disabling left sciatic pain secondary to an L4-5 disk herniation on the left  side.  He also has spondylolisthesis at L5 which has never been a problem to  him in the past.   The day of admission he was taken to the operating room where a left L4-5  laminotomy and disk excision was carried out without complication or  problem.  Postoperatively he was nauseated.  On the day following surgery,  this morning, this has resolved.  Pain control has been satisfactory with  oral medication and intravenous Toradol.  His wound this morning looks fine  and the dressing has been changed and the drain which was placed has been  removed.  He ambulated yesterday afternoon.   He has been instructed to avoid bending, lifting, to avoid prolonged  sitting, to call me for any wound drainage or persistent fever.  He is being  given a prescription for Norco 10 one to two q.6 h p.r.n. 60 tablets.  If  his pain is mild he is to use Aleve 2 tablets, up to twice a day to  alleviate the pain.  If there is any problem at all, he is to call me.  He  is to call the office and make an appointment for approximately 2 weeks from  now for follow-up.   FINAL DIAGNOSIS:  L4-5 disk herniation.   Discharge medications, follow-up instructions and arrangements etc. - see  above.      Nelda Severe, MD  Electronically Signed     MT/MEDQ  D:  05/21/2006  T:  05/21/2006  Job:  [id number]

## 2011-02-01 NOTE — Discharge Summary (Signed)
Alec [name], Alec [name]                ACCOUNT NO.:  [id number]   MEDICAL RECORD NO.:  [id number]          PATIENT TYPE:  INP   LOCATION:  5155                         FACILITY:  [facility]   PHYSICIAN:  Tera Mater. Evlyn Kanner, M.D. DATE OF BIRTH:  Nov 02, 1960   DATE OF ADMISSION:  10/22/2007  DATE OF DISCHARGE:  10/28/2007                               DISCHARGE SUMMARY   DISCHARGE DIAGNOSES:  Are as follows:  1. Recurrent pancreatitis, due to significant hyperlipidemia.  2. Significant hyperlipidemia.  3. Type 2 diabetes.  4. Hypertension.  5. Goiter.  6. Gout.  7. Bronchitis.   CONSULTATIONS:  GI, Dr. Dorena Cookey.   PROCEDURES:  Including a CT of the abdomen.   Alec [name] is a 50 year old white male with a history of prior  pancreatitis, due to extreme hyperlipidemia.  He presented with typical  recurrent-type situation to me on October 21, 2006.  He was brought in,  treated with sliding scale insulin and given n.p.o. status with fluids  and pain relief.  His initial triglycerides were 7,316.  His amylase and  lipase never were up much.  His CT was significant for inflammation but  no phlegmon of significant abscess.  With n.p.o. status and treatment,  as the triglycerides came down to 1484 and then down further, as will be  noted below.  Overall, his pain was a bit slow to resolve but then did  get better.  His situation was reviewed in detail by Dr. Dorena Cookey, who  felt there was atypical natures to it, with the amylase being normal.  He has suggested a tertiary care consultation, and this is going to be  undertaken.  In addition, the patient has had a prior unusual episode of  the retroperitoneal process that was hard to define as well.   MEDICATIONS:  His medications at discharge include:  1. Altace 10.  2. Baby aspirin 81.  3. Allopurinol 300.  4. Lovasa 4 pills a day.  5. Lexapro 10.  6. Hydrocodone as needed.  7. Lantus 25 at bedtime.  8. Sliding scale of NovoLog.  9.  Vicodin as needed.  10.Tricor 145.  11.He is also on Avelox 400 a day, for one more dose.   LAB DATA:  On February 5, his white count was 9,500, hemoglobin 12.2,  platelets 316,000.  His final white count was 3,700, hemoglobin 12.2,  platelets 221,000.  Initial chemistry:  Sodium 136, potassium 3.9,  chloride 103, CO2 23, BUN 10, creatinine 0.71, glucose of 172.  His  potassium dipped briefly to 3.2, sodium dipped briefly to 134.  Albumin  was 3.2 and dropped to 2.5 and up to 2.7.  Total protein was 6.4, AST  was normal at 31, ALT was normal at 19, alk phos was normal at 83, total  bili was normal at 1.  Amylase started at 61, went down to 55 and then  19.  Lipase was mildly elevated at 84 with normal up to 51 and went down  from 34 and down to 18.  Initial lipid profile:  Total cholesterol was  661, triglycerides  7,316.  Repeat was a total cholesterol 382 with  triglycerides 1484 and HDL of 20, and final was total cholesterol 381,  triglyceride 893 and HDL of 21.  A CT of the abdomen on February 5:  Acute pancreatitis with secondary inflammation changes around the  duodenum, no pseudocyst or abscess, fatty liver with hepatomegaly,  spleen within upper limit of normal for size, bilateral L5 pars defect.   In summary, we have 50 year old white male with history of extreme  hyperlipidemia and recurrent pancreatitis.  The patient presented with  another episode that was relatively uncomplicated.  Fortunately, he did  well.  Due to some atypical nature of the presentation; however, a  outpatient evaluation was planned at a tertiary care center.   Pain management is as noted above.  Diet is to be no concentrated  sweets, very low fat.  He is to attempt to lose 5 to 7 pounds.  He is to  let us know if he has recurrent problems.  His blood sugars checked and  will be as before.  He is to see me in 1 to 2 weeks.           ______________________________  Tera Mater Evlyn Kanner, M.D.      SAS/MEDQ  D:  11/17/2007  T:  11/17/2007  Job:  [id number]

## 2011-02-01 NOTE — Discharge Summary (Signed)
NAMESHERON, Alec [name]                ACCOUNT NO.:  [id number]   MEDICAL RECORD NO.:  [id number]          PATIENT TYPE:  INP   LOCATION:  6703                         FACILITY:  [facility]   PHYSICIAN:  Barry Dienes. Eloise Harman, M.D.DATE OF BIRTH:  [birth year]   DATE OF ADMISSION:  09/07/2007  DATE OF DISCHARGE:  09/11/2007                               DISCHARGE SUMMARY   PERTINENT FINDINGS:  The patient is a 50 year old Caucasian man with a  history of diabetes mellitus, type 2, obesity, hypertension,  hyperlipidemia, and gout who presented to the emergency room with  intractable nausea, vomiting, and epigastric pain.  He was last  hospitalized in October 2007 with a similar presentation.  It was  diagnosed as due to pancreatitis due to hyperlipidemia.  He was seen in  our office on August 12, 2007 at which time his hemoglobin A1c level  was 7.0.  His most recent lipid panel showed triglycerides still in the  3000 range with a total cholesterol level of 528, despite the aggressive  dietary counseling and medications to include TriCor.  He presented to  the emergency room with their workup and laboratory tests, all  compatible with pancreatitis.  He was admitted for further management.   MEDICATIONS PRIOR TO ADMISSION:  1. Altace 10 mg daily.  2. Aspirin 81 mg daily.  3. Allopurinol 300 mg daily.  4. Fish oil 1 capsule daily.  5. Lexapro 10 mg daily.  6. Lantus insulin 25 international units subcutaneous q.h.s.  7. NovoLog insulin 8 units subcutaneous a.c. t.i.d.  8. Tricor 145 mg daily, Mobic 15 mg daily.  Skelaxin 800 mg p.o. q.i.d.   PAST MEDICAL ILLNESSES:  1. T12, traumatic compression fracture.  2. Diabetes mellitus, type 2.  3. Hypertension.  4. Hyperlipidemia.  5. Gout.  6. Diabetic nephropathy.   INITIAL PHYSICAL EXAM:  VITAL SIGNS:  Temperature 99, blood pressure  122/72, pulse 90 and regular, respiratory rate 18.  GENERAL: He was a mildly obese appearing white man who is in  mild  distress from epigastric pain, HEENT: Exam was within normal limits and  without scleral icterus.  NECK:  Neck was supple without jugular venous distention or carotid  bruit.  CHEST:  Chest was clear to auscultation.  CARDIOVASCULAR:  Heart had a regular rate and rhythm without murmur,  gallop, or rub.  ABDOMEN:  Had normal bowel sounds with diffuse upper abdominal  tenderness without rebound tenderness.  EXTREMITIES: Were without cyanosis, clubbing, or edema.  NEUROLOGICAL:  Exam was nonfocal   LABORATORY STUDIES:  A CT scan of the abdomen and pelvis showed the  following:  Diffusely decreased density at the liver, edema of the  pancreatic head and uncinate process with extensive infiltration of the  peripancreatic fat and enhancement of the entire gland with no organized  fluid collections and small peripancreatic lymph nodes measuring 1 cm in  short axis.  Inflammation extended inferiorly to the right lower  quadrant with a secondary inflammatory changes in the adjacent duodenum.  The pelvic CT scan was unremarkable.  The scan was felt consistent with  acute  pancreatitis with no evidence of pseudocyst or abscess and a fatty  enlarged liver with mild splenomegaly.  White blood cell count was 8.3,  hemoglobin 10.9, hematocrit 31 platelets 217, serum sodium 127,  potassium 4.2, chloride 105 carbon dioxide 22, glucose 220, BUN 13,  creatinine 0.96, total protein 6.8, albumin 3, ALT 21, alkaline  phosphatase 51, total bilirubin 0.9, lipase 61 triglycerides were  greater than 5000 and urinalysis had proteinuria but was nitrite  negative.   HOSPITAL COURSE:  The patient was admitted to a medical bed without  telemetry.  He was started on IV fluids with bowel rest.  He was seen by  GI consultant who recommended an abdominal ultrasound to rule out the  possibility of gallstone pancreatitis.  The abdomen ultrasound showed no  evidence of biliary disease with fatty infiltration of the  liver and  poor visualization of the pancreas.  Gradually the patient's abdominal  pain and nausea resolved with bowel rest.   PROCEDURES:  CT scan of the abdomen and pelvis and abdomen ultrasound.   COMPLICATIONS:  None.   CONDITION ON DISCHARGE:  He had very mild epigastric pain and was eating  all foods without difficulty.  Most recent physical exam shows blood  pressure 138/83, pulse 79, respirations 20, temperature 98.2, pulse ox  saturation 97% on room air.  His chest was clear to auscultation.  His  heart had a regular rate and rhythm.  His abdomen showed normal bowel  sounds and very mild epigastric tenderness without rebound.  Most recent  laboratory tests included serum sodium 138, potassium 3.3, chloride 106,  carbon dioxide 27, BUN 2, creatinine 0.75, glucose 180, white blood cell  count 5.3, hemoglobin 10, hematocrit 31, platelets 220, lipase 31,  amylase 19, total cholesterol 339, triglycerides 814, HDL 22.   DISCHARGE DIAGNOSES:  1. Acute pancreatitis.  2. Severe hypertriglyceridemia.  3. Gastroesophageal reflux disease.  4. Hypertension.  5. Down.  6. Depression.  7. Diabetes mellitus, type 2, controlled.  8. Osteoarthritis.  9. Insomnia.  10.Constipation.   DISCHARGE MEDICATIONS:  1. Over-the-counter Prilosec 20 mg p.o. daily.  2. Altace 10 mg p.o. daily.  3. Aspirin 81 mg p.o. daily.  4. Allopurinol 300 mg p.o. daily.  5. Fish oil 4 grams p.o. daily.  6. Lexapro 10 mg p.o. daily.  7. Lantus insulin 25 units subcutaneous nightly.  8. NovoLog insulin 8 units subcutaneous a.c. t.i.d.  9. Tricor 145 mg p.o. daily.  10.Mobic 15 mg p.o. daily p.r.n. pain.  11.Skelaxin 800 mg p.o. t.i.d. p.r.n. muscle spasms.  12.Vicodin 5/500 1 tablet p.o. t.i.d. p.r.n. pain.  13.Ambien 10 mg p.o. nightly p.r.n. sleep.  14.MiraLax 17 grams p.o. daily.  15.Senna take one or 2 tablets daily p.r.n. constipation.   DISPOSITION/FOLLOW UP:  The patient was discharged home.  He is  advised  to have no alcoholic drinks at all and limit his intake of high fat  foods such as pizza, steak, and fried foods.  It was recommended that he  have a follow-up appointment with Dr. Adrian Prince in approximately 1-2  weeks following discharge and additional follow-up appointment with Dr.  Bing Matter GI in approximately 1 month following discharge.           ______________________________  Barry Dienes. Eloise Harman, M.D.     DGP/MEDQ  D:  11/25/2007  T:  11/27/2007  Job:  [id number]   cc:   Jeannett Senior A. Evlyn Kanner, M.D.

## 2011-02-01 NOTE — Op Note (Signed)
NAMEYANKY, VANDERBURG                ACCOUNT NO.:  [id number]   MEDICAL RECORD NO.:  [id number]          PATIENT TYPE:  AMB   LOCATION:  SDS                          FACILITY:  [facility]   PHYSICIAN:  Nelda Severe, MD      DATE OF BIRTH:  06-09-1961   DATE OF PROCEDURE:  05/20/2006  DATE OF DISCHARGE:                                 OPERATIVE REPORT   SURGEON:  Nelda Severe, MD   ASSISTANT:  Lianne Cure, P.A.   PREOPERATIVE DIAGNOSIS:  Left L4-5 herniated nucleus pulposus; L5  spondylolysis bilaterally.   POSTOPERATIVE DIAGNOSIS:  Left L4-5 herniated nucleus pulposus; L5  spondylolysis bilaterally.   OPERATIVE PROCEDURE:  Left L4-5 laminotomy and disk excision (08657).   DESCRIPTION OF PROCEDURE:  The patient was placed under general endotracheal  anesthesia.  Sequential compression devices were placed on both lower  extremities.  A Foley catheter was not placed in the bladder.  The patient  was positioned prone on the [place] frame.  Care was taken to position the  upper extremities so as to avoid hyperabduction and flexion of the shoulders  and any hyperflexion of the elbows.  There was no pressure on the cubital  tunnels.  The upper extremities were padded from axilla to hands with foam.  The hips were relatively extended with the knees flexed slightly and thighs,  knees and shins supported on pillows.  The lumbar area was clipped of hair.  A Elihue was made over the midline at the L4-5, S1 level with a skin marker.  The area was prepped with DuraPrep.  Surgical draping was applied and  secured with Ioban.   The skin was scored at the proposed incision site and then a mixture of  0.25% Marcaine with epinephrine and 1% lidocaine with epinephrine injected  subcutaneously and intramuscularly on the left side.  The incision was then  deepened to the spinous processes cutting current and Cobb elevator was used  to mobilize the paraspinal muscles at what was perceived to be L4-5.   A  cross-table lateral radiograph was taken with a Kocher on what was the L4  spinous process.   The lamina of L4 and L5 were denuded of soft tissue.  The lamina of L5  appeared somewhat loose as anticipated.  A Taylor retractor was then placed  lateral to the L4-5 apophyseal joint.   We then removed interlaminar tissue.  I then created a plane between the  trailing edge of the L4 lamina and ligamentum flavum as well as between the  apophyseal joint at its medial extent and the ligamentum flavum and joint  capsule.  I also curetted away the attachment of the ligamentum flavum into  the upper border of L5.  I then used a small osteotome to do approximately  30% medial facetectomy of the inferior articular process of L4 and to remove  lamina proximally a distance of about 6 or 7 mm.  We then, having further  detached the ligamentum flavum with an angled curette, used a 3-mm Kerrison  rongeur to decompress the lateral recess and extend the  laminectomy somewhat  distally, 3 mm at maximum into the upper border of L5.  The ligamentum  flavum was then released superiorly, laterally and inferiorly and could be  retracted medially.  Once we were in the spinal canal a fragment of disk  could be identified extruding out from deep to the L5 nerve root.  The L5-4  nerve root was easily mobilized and held with a Love retractor.  We then  used a nerve hook to tease the first large fragment out and removed it with  a pituitary rongeur.  Many other smaller fragments were delivered and  removed.  Disk space was curetted with Scoville and Epstein curettes and  more loose pieces removed.  Epidural bleeders were controlled with bipolar  coagulation ultimately by placing Gelfoam in the epidural space for  approximately 6 minutes and then removing it.  A dull nerve hook was used to  check the L4-5 neural foramen, which at the completion of the procedure  appeared well decompressed and the same nerve hook was  used to palpate the  L5 nerve root distally to the distal extent of the L5 pedicle.  We did not  address the L5 spondylolysis as it has never been symptomatic and appears  not to have been associated with the onset of this man's current symptoms.  The L5 nerve root did appear well decompressed and under no tension at the  end of procedure.   We then irrigated the disk space out with saline, injected through a 14-  gauge Angiocath plastic catheter.  As noted above, Gelfoam was removed and  there was virtually no epidural bleeding.  However, we did place a one-  eighth inch Hemovac drain in the subfascial area to drain off any ooze.  Blood loss estimated at about 100-150 mL, mostly from epidural bleeding at  the time of mobilizing the nerve root and cauda equina proximally.   The wound was closed in layers using continuous #1 Vicryl in this fascia,  interrupted 2-0 Vicryl in the subcutaneous tissue and subcuticular undyed 3-  0 Vicryl in continuous fashion in the skin.  The skin edges were secured  with Steri-Strips.  Nonadherent antibiotic ointment dressing was applied and  secured with OpSite.  Prior to applying the dressing, the eighth inch  Hemovac drain was secured with a 2-0 nylon suture in basket weave fashion.   There were no intraoperative complications.  The sponge, needle counts were  correct.   At the time of dictation, the patient has not awaken so that a neurologic  examination is not reported here.  It is not anticipated there will be any  problems as there were no intraoperative complications.      Nelda Severe, MD  Electronically Signed     MT/MEDQ  D:  05/20/2006  T:  05/20/2006  Job:  [id number]

## 2011-02-01 NOTE — Op Note (Signed)
NAMEJUSIAH, AGUAYO                ACCOUNT NO.:  [id number]   MEDICAL RECORD NO.:  [id number]          PATIENT TYPE:  AMB   LOCATION:  DSC                          FACILITY:  [facility]   PHYSICIAN:  Christopher E. 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   OPERATION:  Left Caldwell-Luc procedure with removal of cyst from the left  maxillary sinus.  Functional endoscopic sinus surgery with left  ethmoidectomy, left sphenoidotomy with removal of cyst from sphenoid sinus.   SURGEON:  Kristine Garbe. Ezzard Standing, M.D.   ANESTHESIA:  General endotracheal anesthesia.   COMPLICATIONS:  None.   BRIEF CLINICAL NOTE:  Alec [name] is a 50 year old gentleman who has had  chronic sinus problems.  Most of his problems have been on the left side  where he has had chronic infection in the maxillary and sphenoid sinus.  He  has had a couple of CT scans which shows opacification of the left maxillary  and left sphenoid sinus with mucopurulent discharge.  His nasal frontal area  and frontal sinus is clear.  Of note, he had a previous left Caldwell-Luc  procedure and left sided sinus surgery initially in 1996 in [place], had  subsequent limited sinus procedure here in 2003.  He has redeveloped  problems mostly on the left side with pain and pressure behind and  underneath the left  eye and the left cheek.  CT scan shows opacification of  the left maxillary sinus and left sphenoid sinus.  He is taken to the  operating room at this time for repeat left Caldwell-Luc and left  sphenoidotomy.   DESCRIPTION OF PROCEDURE:  After adequate endotracheal anesthesia, the  nose  was prepped with cotton pledgets soaked in Afrin and the nose was  injected  with Xylocaine with epinephrine.  The sublabial area on the left side was  injected with Xylocaine with epinephrine.  First, the Caldwell-Luc procedure  was performed.  Using a scalpel, incision was made in the gingival labial  groove well above the tooth roots.  The mucoperiosteum was elevated off the  face of the maxillary sinus on the left side and the previous scar tissue  and opening of the maxillary sinus was identified.  It was enlarged with  Kerrison forceps to approximately 1.5 to 2 cm in size.  The maxillary sinus  had diffuse swelling of the maxillary mucosa with some purulence within the  sinus.  There were some polypoid changes.  The maxillary ostia was widely  patent and nothing needed to be done to enlarge the maxillary ostia.  Using  the micro-debrider, the polypoid changes of the maxillary sinus were  removed.  After removing the polypoid changes of the maxillary mucosa, the  sinus was packed with Afrin soaked pledgets.  Next, using the endoscope, the  ethmoid area was examined.  The  anterior and nasal frontal area was clear.  There was some thickening and partial blockage of the posterior ethmoid  area.  Using the microdebrider, the posterior ethmoid cells inferiorly were  opened up.  The space of the sphenoid sinus was identified and was opened  initially with suction and then enlarged with the microdebrider to  approximately 1 cm size.  Within the sphenoid sinus, there was again  thickened mucosa and polyps or cyst within the sinus which were removed.  After removing the polyps and/or cyst, the sinus was, otherwise, clear.  The  sphenoidotomy was approximately 1 cm size allowing easy access with the 10  suction and 0 endoscope.  This completed the procedure.  The nose was  cleaned, a few bleeding spots were cauterized with suction cautery.  A  single Kennedy sinus pack was placed in the posterior ethmoid in the  sphenoidotomy region.  The sublabial incision for  the Caldwell-Luc was  closed with 4-0 chromic sutures subcutaneously and to reapproximate the  mucosal edges.  This completed the procedure.  Jacobey received 1 gram Ancef  preoperatively.  He was awakened from anesthesia and transferred to the  recovery room postoperatively doing well.   DISPOSITION:  Aarin will be discharged home later this morning on cold  compresses to his left cheek, elevation of the head of the bed, Keflex 500  mg b.i.d. for one week, Tylenol and Vicodin p.r.n. pain.  We will have him  follow up in my office on Monday to have the [place] sinus pack removed.           ______________________________  Kristine Garbe Ezzard Standing, M.D.     CEN/MEDQ  D:  08/23/2005  T:  08/23/2005  Job:  [id number]

## 2011-02-01 NOTE — Op Note (Signed)
NAMECOLESON, KANT                ACCOUNT NO.:  [id number]   MEDICAL RECORD NO.:  [id number]          PATIENT TYPE:  AMB   LOCATION:  DSC                          FACILITY:  [facility]   PHYSICIAN:  Lubertha Basque. Dalldorf, M.D.DATE OF BIRTH:  [birth year]   DATE OF PROCEDURE:  12/18/2004  DATE OF DISCHARGE:                                 OPERATIVE REPORT   PREOPERATIVE DIAGNOSIS:  Left carpal tunnel syndrome.   POSTOPERATIVE DIAGNOSIS:  Left carpal tunnel syndrome.   PROCEDURE:  Left carpal tunnel release.   ANESTHESIA:  Bier block, MAC.   ATTENDING SURGEON:  Lubertha Basque. Jerl Santos, M.D.   ASSISTANT:  Lindwood Qua, P.A.   INDICATIONS:  The patient is a 50 year old man with a long history of left  hand numbness and pain. He underwent some nerve conduction studies which  shows severe carpal tunnel syndrome. He has basically counts and symptoms  and failed two point discrimination and is offered a carpal tunnel release.  Informed operative consent was obtained, after discussion of possible  complications of reaction to anesthesia, infection, neurovascular injury.   DESCRIPTION OF PROCEDURE:  The patient was taken to the operating suite  where Bier block and MAC were applied. Bier block was to the level of his  forearm. We did supplement this with a little bit of local lidocaine. He was  prepped and draped in normal sterile fashion. After administration of preop  IV Kefzol, a small palmar incision was made ulnar to the thenar flexion  crease. This did not cross the wrist flexion crease. Dissection was carried  down through the palmar fascia to expose the transverse carpal ligament.  This was extremely thickened and was released completely. The dissection was  taken distally to near the transverse arch of vessels and proximally through  the distal fascia of the forearm. There was some deformation of the nerve.  The wound was irrigated followed by reapproximation of the skin with nylon  vertical mattress sutures. The tourniquet was deflated in the fingers became  pink and warm immediately. I injected some Marcaine about the incision site.  A small amount pressure was held on the incision until a small amount  bleeding had ceased.  Adaptic was applied followed by dry gauze and a volar  splint of plaster with the wrist in slight extension. Estimated blood loss  and intraoperative fluids can be obtained from anesthesia records as can  accurate tourniquet time.   DISPOSITION:  The patient was taken to recovery room in stable addition.  Plans were for him to go home the same day and follow up in the office in  less than a week. I will contact him by phone tonight.    PGD/MEDQ  D:  12/18/2004  T:  12/18/2004  Job:  [id number]

## 2011-02-01 NOTE — H&P (Signed)
NAMEBRAND, SIEVER                ACCOUNT NO.:  [id number]   MEDICAL RECORD NO.:  [id number]          PATIENT TYPE:  INP   LOCATION:  5120                         FACILITY:  [facility]   PHYSICIAN:  Larina Earthly, M.D.        DATE OF BIRTH:  09/15/1961   DATE OF ADMISSION:  06/26/2006  DATE OF DISCHARGE:                                HISTORY & PHYSICAL   CHIEF COMPLAINT:  Nausea, vomiting and abdominal pain.   HISTORY OF PRESENT ILLNESS:  This is a 50 year old Caucasian male with  longstanding type 2 diabetes mellitus, obesity, hypertension, hyperlipidemia  with a triglyceride level that has been as high as 3000-6000, gout, diabetic  proteinuria, left ventricular hypertrophy, some question of a  retroperitoneal process occurring in 2005 of unclear delineation, who  presents with a 1-day history of nausea without vomiting but significant  abdominal discomfort that started the previous night.  This started in the  epigastric region and began spreading diffusely in the left greater than  right abdominal areas associated with dizziness and headache.  The patient  denies any upper respiratory tract infections or symptoms.  His last bowel  movement was this morning, which was considered normal in consistency.  He  denied any chest pain, but he did complain of increasing pain and discomfort  with chills associated.  The patient was seen in our office on the day of  admission and was noted to have a white blood cell count of 14.3, was noted  to be in moderate distress and malaise secondary to dry heaving as well as  chills.  His blood was drawn and was noted extremely lipemic.  His CBG at  the time was 196.  Labs were sent out stat for CMET, amylase and lipase.  As  of right now we do not have those results with the exception of a sodium of  119 and a triglyceride level of 9120, which, of course, is for the most part  nonfasting; however, the patient has had very little to eat for the last 24  hours.   REVIEW OF SYSTEMS:  Negative for dysuria, hematuria, blood per rectum,  abdominal trauma, chest pain, shortness of breath, new neurologic or  musculoskeletal deficits.  Please note that the patient states that he has  not taken any of his medications the last 1 week secondary to awaiting their  arrival in the mail.   PROBLEM LIST:  1. T12 compression fracture that was traumatic.  2. Type 2 diabetes mellitus.  3. Hypertension.  4. Hyperlipidemia.  5. Gout.  6. Diabetic proteinuria with workup by nephrology  7. Left ventricular hypertrophy.  8. Small goiter in 2004.  9. Retroperitoneal process with multiple CTs obtained in 2005.  10.History of sinus surgery.  11.History of knee surgery in 2004.  12.History of deviated septum and nasal polyps.   FAMILY HISTORY:  The patient has two parents with diabetes and a father with  prostate cancer.   The patient is married for numerous years and works with [employer].   Current medications, which he ha not been taking  for the last 1 week,  include:  1. Altace 10 mg each day.  2. Baby aspirin each day.  3. Niaspan extended release two to four each day.  4. Allopurinol 300 mg each day.  5. Avandamet 12/998 one p.o. b.i.d.  6. Multivitamin each day.  7. Micardis 80 mg each day.  8. Vytorin 10/80 mg one daily.  9. Fish oil 4000 each day.  10.Lexapro 10 mg each day.  11.Hydrocodone p.r.n.  12.Hydromorphone p.r.n.   Labs as above.   PHYSICAL EXAMINATION:  GENERAL:  We have a Caucasian male sitting in a chair  in general malaise.  VITAL SIGNS:  Blood pressure 134/90, pulse 100, respirations 22, temperature  99.1 degrees Fahrenheit.  HEENT:  Sclerae anicteric.  Extraocular movements are intact.  There are no  oropharyngeal lesions.  NECK:  Supple.  There is no cervical lymphadenopathy.  LUNGS:  Clear to auscultation bilaterally with some coarse breath sounds.  CARDIOVASCULAR:  Tachycardia with a regular rhythm.  ABDOMEN:  A soft but  diffusely tender exam, left greater than right, with a  bowel sounds somewhat decreased.  There is no rebound tenderness.  EXTREMITIES:  There is no lower extremity edema.  Pedal pulses are intact.   ASSESSMENT/PLAN:  1. Abdominal pain associated with leukocytosis and hypertriglyceridemia,      suspicious for pancreatitis.  Will provide IV fluids and IV Unasyn      empirically.  Will provide adequate pain control with morphine and      antiemetics with Phenergan.  Will obtain a CT scan with oral contrast      and IV contrast if creatinine is satisfactory.  2. Type 2 diabetes mellitus.  Will use sliding scale insulin and defer      oral agents at this time and keep the patient n.p.o. except for ice      chips.  3. Hypertension.  Again, the patient has been off of his medications for      approximately 1 week, but will continue to monitor and add IV and/or      oral agents as indicated.  4. Hypertriglyceridemia.  Again, will need to restart Vytorin, possible      fenofibrate and/or Niaspan in the near future and review records for      what has failed and worked in the past      Larina Earthly, M.D.  Electronically Signed     RA/MEDQ  D:  06/26/2006  T:  06/28/2006  Job:  [id number]   cc:   Jeannett Senior A. Evlyn Kanner, M.D.

## 2011-02-01 NOTE — Consult Note (Signed)
Alec [name], VANTERPOOL                ACCOUNT NO.:  [id number]   MEDICAL RECORD NO.:  [id number]          PATIENT TYPE:  INP   LOCATION:  5120                         FACILITY:  [facility]   PHYSICIAN:  John C. Madilyn Fireman, M.D.    DATE OF BIRTH:  Oct 09, 1960   DATE OF CONSULTATION:  06/27/2006  DATE OF DISCHARGE:                                   CONSULTATION   CHIEF COMPLAINT:  Abdominal pain, possible pancreatitis.   HISTORY OF PRESENT ILLNESS:  This is a 50 year old male who has a history of  type 2 diabetes, high triglycerides, complaining of abdominal pain that  started on June 25, 2006, Wednesday evening as he was walking to his  neighbor's house and increased overnight to the point Thursday morning where  he felt  like he almost could not get out of bed.  Nausea and dry heaves  started Thursday.  He reported to his endocrinologist's office, Dr. Evlyn Kanner,  but was seen by Dr. Felipa Eth.  Blood work showed triglycerides of 9200, as  reported by the patient, on June 26, 2006.  CT scan from June 26, 2006, shows edema in the pancreatic tail, sludge in the gallbladder, and  fatty liver.   PAST MEDICAL HISTORY:  1. Diabetic type 2.  2. Hypercholesterolemia, the patient reports in the 3000s for the last 5-6      years.   PAST SURGICAL HISTORY:  Surgeries include an L4-L5 disk  herniation/laminectomy in September 2007.  He has had multiple surgeries to  his left sinus.  He has had surgery for left carpal tunnel syndrome and his  right knee has been scope.  He had similar symptoms to what he is having now  back in 2005 and a CT showed retroperitoneal inflammation and stranding.  An  EGD at that time in August of 2005 by Dr. Evette Cristal showed gastritis but no  explanation for the CT result.   CURRENT MEDICATIONS:  Current medications at home include Vytorin which he  stopped two weeks ago, Altace, Niaspan, allopurinol, Jenuvia, Avandia, and  Lexapro.   ALLERGIES:  HE HAS AN ALLERGY TO  OXYCODONE/APAP.   REVIEW OF SYSTEMS:  Review of systems negative for recent illness before  June 25, 2006.  Positive for fevers since he has been in the hospital of  99 to his current temperature which is 103.  He reports no change in bowel  habits, no diarrhea, no indigestion or dysphasia.  He has recovered well  from his recent back surgery and has no back pain currently.   SOCIAL HISTORY:  He occasionally drinks alcohol.  He reports approximately  two beers per month.  No tobacco, no drugs.  Lives with his wife.  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   FAMILY HISTORY:  His family history is negative for pancreatic, liver, and  colon cancer.  He also knows no one in his family that has had their  gallbladder removed.   PHYSICAL EXAMINATION:  GENERAL:  He is alert and oriented but appears in  pain.  He is laying in bed.  CARDIOVASCULAR:  His cardiovascular system shows a tachy rate with a regular  rhythm.  LUNGS:  Lungs were clear to auscultation.  NECK:  Neck is supple with no adenopathy, no thyromegaly.  ABDOMEN:  His abdomen is convex, distended, with positive bowel sounds,  slightly tympanitic to percussion, tender to even light palpation in the  epigastric region extending down to the lower left quadrant.  He has no  bruits.  Positive peritoneal sign.  Positive rebound tenderness.  VITAL SIGNS:  Currently his temperature is 103.1, pulse is 107, respirations  are 22, blood pressure is 150/82, O2 saturation was 91% on room air and 93%  on 2 liters.   LABS:  He has a CBC that shows a white count 17.8, hemoglobin 12.7,  hematocrit 36.1, platelets are 288,00.  Chem-7 is essentially normal.  Liver  enzymes are normal with an AST of 12, ALT of 16, alkaline phosphatase 35,  total bilirubin 0.5, serum albumin 2.6.  His amylase and lipase on June 25, 2006, were 47 and 69, respectively.  Today amylase is 30, lipase is 45,  so normal.  Calcium today is 7.8.  His  UA shows proteinuria.  He does report  that he has dark-colored urine intermittently.   ASSESSMENT/PLAN:  He is currently on morphine, Tylenol, Phenergan, and 3  grams of Unasyn intravenously every 6.  Dr. Madilyn Fireman has seen and examined the  patient.  He collected a history.  He has examined the CT scans and the  records from 2005 as well as the current records.  His assessment is that  this is a very puzzling picture consistent with pancreatitis except for  essentially normal amylase and lipase.  He notes that the patient reports  triglycerides in the 3000-8000 range, and he has from this with Dr. Felipa Eth.  He had similar presentation two years ago with retroperitoneal inflammation  and stranding near the pancreas but normal-appearing pancreas and normal  amylase and lipase then as well.  EGD was negative for ulcer.  Differential  diagnosis includes pancreatitis or pancreatic pain with classic  diverticulitis, infectious enteritis, irritable bowel disease, autoimmune  causes, ischemic disease.  His plan of care is to treat with empiric  antibiotics and supportive care for now, await cultures, consult surgery and  infectious disease if the patient worsens.  Dr. Madilyn Fireman goes on to note that  he reviewed the current and previous CT with radiology this afternoon.  There was no diverticulosis noted and it was unclear whether there is a  primary pancreatic inflammation or some adjacent process involving the  pancreas.      Stephani Police, PA    ______________________________  Everardo All Madilyn Fireman, M.D.    MLY/MEDQ  D:  06/27/2006  T:  06/29/2006  Job:  [id number]   cc:   Larina Earthly, M.D.  John C. Madilyn Fireman, M.D.

## 2011-02-01 NOTE — Op Note (Signed)
NAMEJAYMES, HANG                ACCOUNT NO.:  [id number]   MEDICAL RECORD NO.:  [id number]          PATIENT TYPE:  AMB   LOCATION:  DSC                          FACILITY:  [facility]   PHYSICIAN:  Lubertha Basque. Dalldorf, M.D.DATE OF BIRTH:  08-12-1961   DATE OF PROCEDURE:  10/28/2006  DATE OF DISCHARGE:                               OPERATIVE REPORT   PREOPERATIVE DIAGNOSES:  1. Right carpal tunnel syndrome.  2. Right shoulder acromioclavicular pain.   POSTOPERATIVE DIAGNOSES:  1. Right carpal tunnel syndrome.  2. Right shoulder acromioclavicular pain.   PROCEDURE:  1. Right carpal tunnel release.  2. Right shoulder acromioclavicular injection.   ANESTHESIA:  General.   ATTENDING SURGEON:  Lubertha Basque. Jerl Santos, M.D.   ASSISTANT:  Lindwood Qua, P.A.   INDICATIONS FOR PROCEDURE:  The patient is a 50 year old man with a long  history of bilateral hand pain and numbness.  He is status post a  successful carpal tunnel release on the opposite side about two years  ago and has similar symptoms on this right side despite bracing and oral  anti-inflammatories.  He is offered operative decompression of the  carpal tunnel.  Informed operative consent was obtained after discussion  of possible complications of reaction to anesthesia, infection,  neurovascular injury.  The patient has also had difficulty with pain on  the top of the shoulder at the acromioclavicular joint.  X-ray shows  some degenerative changes.  He has failed oral anti-inflammatories there  and was offered an injection while he is under anesthesia for his carpal  tunnel release.   SUMMARY OF FINDINGS AND PROCEDURE:  Under general anesthesia, first a  right shoulder AC joint injection was performed with 80 mg of Depo-  Medrol and 1 mL of Marcaine.  A Band-Aid was applied.  We then addressed  the right carpal tunnel with an operative release through palmar  incision.  He did have moderate compression of the median nerve  with a  thick transverse carpal ligament.   DESCRIPTION OF PROCEDURE:  The patient was taken to operating suite  where general anesthetic was applied without difficulty.  He was  positioned supine.  I then prepped the [facility] joint with alcohol and  injected with the aforementioned Depo-Medrol and Marcaine.  A Band-Aid  was applied.  He was then prepped and draped in normal sterile fashion  at the arm.  After administration of IV Kefzol the right arm was  elevated, exsanguinated, and a tourniquet inflated about the forearm.  A  small palmar incision was made.  This incision did not cross the wrist  flexion crease and was slightly ulnar to the thenar flexion crease.  Dissection was carried down through palmar fascia to the transverse  carpal ligament.  This was released under direct visualization and was  significantly thickened applying compression to the underlying median  nerve.  Dissection was taken distally to the transverse arch of vessels  and proximally through the distal fascia of the forearm.  The wound was  irrigated followed by release of tourniquet.  A small amount of bleeding  was easily  controlled with Bovie cautery and some pressure.  His fingers  all became pink and warm immediately after tourniquet release.  The skin  was reapproximated with vertical mattresses of nylon.  Some Marcaine was  injected about the incision site followed by Adaptic with a dry gauze  dressing and volar splint of plaster with the wrist in slight extension.  Estimated blood loss and intraoperative fluids as well as accurate  tourniquet time can obtained from anesthesia records.   DISPOSITION:  The patient was extubated in the operating room and taken  to recovery in stable addition.  He was to go home the same-day and  follow up in the office in less than a week.  I will contact him by  phone tonight.      Lubertha Basque Jerl Santos, M.D.  Electronically Signed     PGD/MEDQ  D:  10/28/2006  T:   10/28/2006  Job:  [id number]

## 2011-06-07 LAB — BASIC METABOLIC PANEL
BUN: 6
BUN: 7
CO2: 22
CO2: 23
Calcium: 8 — ABNORMAL LOW
Calcium: 8.3 — ABNORMAL LOW
Chloride: 105
Chloride: 107
Creatinine, Ser: 0.64
Creatinine, Ser: 0.87
GFR calc Af Amer: >60
GFR calc Af Amer: >60
GFR calc non Af Amer: >60
GFR calc non Af Amer: >60
Glucose, Bld: 143 — ABNORMAL HIGH
Glucose, Bld: 92
Potassium: 3.2 — ABNORMAL LOW
Potassium: 3.6
Sodium: 137
Sodium: 138

## 2011-06-07 LAB — LIPASE, BLOOD
Lipase: 18
Lipase: 34
Lipase: 84 — ABNORMAL HIGH

## 2011-06-07 LAB — COMPREHENSIVE METABOLIC PANEL
ALT: 19
ALT: 20
ALT: 23
AST: 22
AST: 24
AST: 31
Albumin: 2.5 — ABNORMAL LOW
Albumin: 2.7 — ABNORMAL LOW
Albumin: 3.2 — ABNORMAL LOW
Alkaline Phosphatase: 55
Alkaline Phosphatase: 57
Alkaline Phosphatase: 83
BUN: 10
BUN: 4 — ABNORMAL LOW
BUN: 6
CO2: 23
CO2: 23
CO2: 24
Calcium: 8.2 — ABNORMAL LOW
Calcium: 8.5
Calcium: 8.6
Chloride: 103
Chloride: 107
Chloride: 108
Creatinine, Ser: 0.69
Creatinine, Ser: 0.7
Creatinine, Ser: 0.71
GFR calc Af Amer: >60
GFR calc Af Amer: >60
GFR calc Af Amer: >60
GFR calc non Af Amer: >60
GFR calc non Af Amer: >60
GFR calc non Af Amer: >60
Glucose, Bld: 108 — ABNORMAL HIGH
Glucose, Bld: 119 — ABNORMAL HIGH
Glucose, Bld: 172 — ABNORMAL HIGH
Potassium: 3.5
Potassium: 3.8
Potassium: 3.9
Sodium: 134 — ABNORMAL LOW
Sodium: 136
Sodium: 138
Total Bilirubin: 0.7
Total Bilirubin: 0.8
Total Bilirubin: 1
Total Protein: 6
Total Protein: 6.3
Total Protein: 6.4

## 2011-06-07 LAB — CBC
HCT: 32.4 — ABNORMAL LOW
HCT: 34.5 — ABNORMAL LOW
HCT: 35.3 — ABNORMAL LOW
HCT: 36.6 — ABNORMAL LOW
Hemoglobin: 11.1 — ABNORMAL LOW
Hemoglobin: 11.9 — ABNORMAL LOW
Hemoglobin: 12.2 — ABNORMAL LOW
Hemoglobin: 12.3 — ABNORMAL LOW
MCHC: 33.6
MCHC: 34.3
MCHC: 34.5
MCHC: 34.6
MCV: 87.9
MCV: 88
MCV: 88.5
MCV: 93.2
Platelets: 193
Platelets: 209
Platelets: 221
Platelets: 316
RBC: 3.68 — ABNORMAL LOW
RBC: 3.9 — ABNORMAL LOW
RBC: 3.93 — ABNORMAL LOW
RBC: 4.02 — ABNORMAL LOW
RDW: 14
RDW: 14.1
RDW: 14.2
RDW: 15.2
WBC: 3.7 — ABNORMAL LOW
WBC: 5.4
WBC: 7.2
WBC: 9.5

## 2011-06-07 LAB — AMYLASE
Amylase: 19 — ABNORMAL LOW
Amylase: 55
Amylase: 61

## 2011-06-07 LAB — IGG 1, 2, 3, AND 4
IgG Subclass 1: 397 mg/dL (ref 382–929)
IgG Subclass 2: 292 mg/dL (ref 242–700)
IgG Subclass 3: 53 mg/dL (ref 22–176)
IgG Subclass 4: 30 mg/dL (ref 4–86)
IgG Total IGGSUB: 799 mg/dL (ref 600–1600)

## 2011-06-07 LAB — LIPID PANEL
Cholesterol: 381 — ABNORMAL HIGH
Cholesterol: 382 — ABNORMAL HIGH
Cholesterol: 661 — ABNORMAL HIGH
HDL: 20 — ABNORMAL LOW
HDL: 21 — ABNORMAL LOW
LDL Cholesterol: UNDETERMINED
LDL Cholesterol: UNDETERMINED
LDL Cholesterol: UNDETERMINED
Total CHOL/HDL Ratio: 18.1
Total CHOL/HDL Ratio: 19.1
Triglycerides: 1484 — ABNORMAL HIGH
Triglycerides: 7316 — ABNORMAL HIGH
Triglycerides: 893 — ABNORMAL HIGH
VLDL: UNDETERMINED
VLDL: UNDETERMINED
VLDL: UNDETERMINED

## 2011-06-21 LAB — COMPREHENSIVE METABOLIC PANEL
ALT: 12
ALT: 25
AST: 15
AST: 60 — ABNORMAL HIGH
Albumin: 2.3 — ABNORMAL LOW
Albumin: 2.6 — ABNORMAL LOW
Alkaline Phosphatase: 38 — ABNORMAL LOW
Alkaline Phosphatase: 39
BUN: 10
BUN: 8
CO2: 22
CO2: 23
Calcium: 7.7 — ABNORMAL LOW
Calcium: 8.1 — ABNORMAL LOW
Chloride: 103
Chloride: 98
Creatinine, Ser: 0.67
Creatinine, Ser: 0.96
GFR calc Af Amer: >60
GFR calc Af Amer: >60
GFR calc non Af Amer: >60
GFR calc non Af Amer: >60
Glucose, Bld: 195 — ABNORMAL HIGH
Glucose, Bld: 232 — ABNORMAL HIGH
Potassium: 3.8
Potassium: 5
Sodium: 131 — ABNORMAL LOW
Sodium: 132 — ABNORMAL LOW
Total Bilirubin: 1.2
Total Bilirubin: 2.5 — ABNORMAL HIGH
Total Protein: 5.7 — ABNORMAL LOW
Total Protein: UNDETERMINED

## 2011-06-21 LAB — CBC
HCT: 31.3 — ABNORMAL LOW
HCT: 31.5 — ABNORMAL LOW
HCT: 35.2 — ABNORMAL LOW
Hemoglobin: 10.6 — ABNORMAL LOW
Hemoglobin: 10.9 — ABNORMAL LOW
Hemoglobin: 12.6 — ABNORMAL LOW
MCHC: 33.8
MCHC: 34.8
MCHC: 35.9
MCV: 88.6
MCV: 89.6
MCV: 90.5
Platelets: 217
Platelets: 220
Platelets: 241
RBC: 3.46 — ABNORMAL LOW
RBC: 3.51 — ABNORMAL LOW
RBC: 3.98 — ABNORMAL LOW
RDW: 13.7
RDW: 13.7
RDW: 14.1
WBC: 15.1 — ABNORMAL HIGH
WBC: 5.3
WBC: 8.3

## 2011-06-21 LAB — HEPATIC FUNCTION PANEL
ALT: 16
ALT: 21
AST: 14
AST: 21
Albumin: 2.3 — ABNORMAL LOW
Albumin: 3 — ABNORMAL LOW
Alkaline Phosphatase: 35 — ABNORMAL LOW
Alkaline Phosphatase: 51
Bilirubin, Direct: 0.1
Bilirubin, Direct: 0.2
Indirect Bilirubin: 0.3
Indirect Bilirubin: 0.7
Total Bilirubin: 0.4
Total Bilirubin: 0.9
Total Protein: 5.6 — ABNORMAL LOW
Total Protein: 6.8

## 2011-06-21 LAB — I-STAT 8, (EC8 V) (CONVERTED LAB)
BUN: 13
Bicarbonate: 23.5
Chloride: 105
Glucose, Bld: 220 — ABNORMAL HIGH
HCT: 46
Hemoglobin: 15.6
Operator id: 294511
Potassium: 4.2
Sodium: 127 — ABNORMAL LOW
TCO2: 25
pCO2, Ven: 35.7 — ABNORMAL LOW
pH, Ven: 7.427 — ABNORMAL HIGH

## 2011-06-21 LAB — LIPID PANEL
Cholesterol: 339 — ABNORMAL HIGH
HDL: 22 — ABNORMAL LOW
LDL Cholesterol: UNDETERMINED
Total CHOL/HDL Ratio: 15.4
Triglycerides: 814 — ABNORMAL HIGH
VLDL: UNDETERMINED

## 2011-06-21 LAB — URINALYSIS, ROUTINE W REFLEX MICROSCOPIC
Bilirubin Urine: NEGATIVE
Glucose, UA: 100 — AB
Ketones, ur: NEGATIVE
Leukocytes, UA: NEGATIVE
Nitrite: NEGATIVE
Protein, ur: >300 — AB
Specific Gravity, Urine: 1.024
Urobilinogen, UA: 0.2
pH: 5

## 2011-06-21 LAB — BASIC METABOLIC PANEL
BUN: 2 — ABNORMAL LOW
CO2: 27
Calcium: 8.1 — ABNORMAL LOW
Chloride: 106
Creatinine, Ser: 0.75
GFR calc Af Amer: >60
GFR calc non Af Amer: >60
Glucose, Bld: 180 — ABNORMAL HIGH
Potassium: 3.3 — ABNORMAL LOW
Sodium: 138

## 2011-06-21 LAB — DIFFERENTIAL
Basophils Absolute: 0
Basophils Relative: 0
Eosinophils Absolute: 0.1 — ABNORMAL LOW
Eosinophils Relative: 2
Lymphocytes Relative: 20
Lymphs Abs: 1.7
Monocytes Absolute: 0.5
Monocytes Relative: 6
Neutro Abs: 6
Neutrophils Relative %: 72

## 2011-06-21 LAB — LIPASE, BLOOD
Lipase: 26
Lipase: 31
Lipase: 41
Lipase: 61 — ABNORMAL HIGH

## 2011-06-21 LAB — TRIGLYCERIDES: Triglycerides: >5000 — ABNORMAL HIGH

## 2011-06-21 LAB — URINE MICROSCOPIC-ADD ON

## 2011-06-21 LAB — AMYLASE
Amylase: 19 — ABNORMAL LOW
Amylase: 35
Amylase: 94

## 2011-06-21 LAB — POCT I-STAT CREATININE
Creatinine, Ser: 0.8
Operator id: 294511

## 2011-06-21 LAB — CALCIUM: Calcium: 8.4

## 2011-10-21 ENCOUNTER — Emergency Department (HOSPITAL_BASED_OUTPATIENT_CLINIC_OR_DEPARTMENT_OTHER)
Admission: EM | Admit: 2011-10-21 | Discharge: 2011-10-21 | Disposition: A | Discharge status: Home / Self Care | Payer: Self-pay | Attending: Emergency Medicine | Admitting: Emergency Medicine

## 2011-10-21 ENCOUNTER — Emergency Department (INDEPENDENT_AMBULATORY_CARE_PROVIDER_SITE_OTHER): Payer: Self-pay

## 2011-10-21 ENCOUNTER — Encounter (HOSPITAL_BASED_OUTPATIENT_CLINIC_OR_DEPARTMENT_OTHER): Payer: Self-pay | Admitting: Student

## 2011-10-21 DIAGNOSIS — R51 Headache: Secondary | ICD-10-CM | POA: Insufficient documentation

## 2011-10-21 DIAGNOSIS — E7800 Pure hypercholesterolemia, unspecified: Secondary | ICD-10-CM | POA: Insufficient documentation

## 2011-10-21 DIAGNOSIS — I10 Essential (primary) hypertension: Secondary | ICD-10-CM | POA: Insufficient documentation

## 2011-10-21 DIAGNOSIS — E1169 Type 2 diabetes mellitus with other specified complication: Secondary | ICD-10-CM | POA: Insufficient documentation

## 2011-10-21 DIAGNOSIS — R739 Hyperglycemia, unspecified: Secondary | ICD-10-CM

## 2011-10-21 HISTORY — DX: Metabolic syndrome: E88.810

## 2011-10-21 HISTORY — DX: Essential (primary) hypertension: I10

## 2011-10-21 HISTORY — DX: Metabolic syndrome: E88.81

## 2011-10-21 LAB — GLUCOSE, CAPILLARY: Glucose-Capillary: 256 mg/dL — ABNORMAL HIGH (ref 70–99)

## 2011-10-21 MED ORDER — DIPHENHYDRAMINE HCL 50 MG/ML IJ SOLN
25.0000 mg | Freq: Once | INTRAMUSCULAR | Status: AC
  Administered 2011-10-21: 25 mg via INTRAVENOUS
  Filled 2011-10-21: qty 1

## 2011-10-21 MED ORDER — ALLOPURINOL 300 MG PO TABS: 300.0000 mg | ORAL_TABLET | Freq: Every day | ORAL | Status: DC

## 2011-10-21 MED ORDER — FENOFIBRATE 160 MG PO TABS: 160.0000 mg | ORAL_TABLET | Freq: Every day | ORAL | Status: DC

## 2011-10-21 MED ORDER — KETOROLAC TROMETHAMINE 30 MG/ML IJ SOLN
30.0000 mg | Freq: Once | INTRAMUSCULAR | Status: AC
  Administered 2011-10-21: 30 mg via INTRAVENOUS
  Filled 2011-10-21: qty 1

## 2011-10-21 MED ORDER — ESCITALOPRAM OXALATE 20 MG PO TABS: 20.0000 mg | ORAL_TABLET | Freq: Every day | ORAL | Status: DC

## 2011-10-21 MED ORDER — CETIRIZINE HCL 10 MG PO CHEW: 10.0000 mg | CHEWABLE_TABLET | Freq: Every day | ORAL | Status: DC

## 2011-10-21 MED ORDER — RAMIPRIL 10 MG PO CAPS: 10.0000 mg | ORAL_CAPSULE | Freq: Every day | ORAL | Status: DC

## 2011-10-21 MED ORDER — DEXAMETHASONE SODIUM PHOSPHATE 10 MG/ML IJ SOLN
10.0000 mg | Freq: Once | INTRAMUSCULAR | Status: AC
  Administered 2011-10-21: 10 mg via INTRAVENOUS
  Filled 2011-10-21: qty 1

## 2011-10-21 MED ORDER — SODIUM CHLORIDE 0.9 % IV BOLUS (SEPSIS)
1000.0000 mL | Freq: Once | INTRAVENOUS | Status: AC
  Administered 2011-10-21: 1000 mL via INTRAVENOUS

## 2011-10-21 MED ORDER — METOCLOPRAMIDE HCL 5 MG/ML IJ SOLN
10.0000 mg | Freq: Once | INTRAMUSCULAR | Status: AC
  Administered 2011-10-21: 10 mg via INTRAVENOUS
  Filled 2011-10-21: qty 2

## 2011-10-21 NOTE — ED Notes (Signed)
Pt in with c/o intermittent migraine headache x several weeks with associated left side cranial pain radiating to left neck. +photophobia and aural sensitivity. +N but denies V, blurred or double vision, but +dizziness. Pt reports acute sinusitis with associated right ear popping/pressure. Reports using Neti pot with well water and saline packet x several weeks.

## 2011-10-21 NOTE — ED Provider Notes (Addendum)
History     CSN: [id number]  Arrival date & time 10/21/11  4098   First MD Initiated Contact with Patient 10/21/11 [phone]      Chief Complaint  Patient presents with  . Headache    (Consider location/radiation/quality/duration/timing/severity/associated sxs/prior treatment) HPI  C/o headache x 2 weeks. Describes gradual onset headache 2 weeks ago. Constant Lt sided headache from nose to ear. Lt neck pain and stiffness. Has been taking ibuprofen and using neti pot with min relief of pain. Currently 9/10. For the last three days has been worse headache of life. No head trauma. No anticoagulations. No h/o migraines. Denies h/o cancer. No recent weight loss +night sweats recently.  No photo or phono phobia. +nausea, no vomiting. Denies numbness/tingling/weakness currently. Occ has numbness R leg due to chronic back pain/issues.   ED Notes, ED Provider Notes from 10/21/11 0000 to 10/21/11 09:58:16       Liliane Shi, RN 10/21/2011 09:52      Pt in with c/o intermittent migraine headache x several weeks with associated left side cranial pain radiating to left neck. +photophobia and aural sensitivity. +N but denies V, blurred or double vision, but +dizziness. Pt reports acute sinusitis with associated right ear popping/pressure. Reports using Neti pot with well water and saline packet x several weeks.     Past Medical History  Diagnosis Date  . Diabetes mellitus   . Hypercholesteremia   . Metabolic syndrome   . Hypertension   . Gout   . Pancreatitis     Past Surgical History  Procedure Date  . Carpal tunnel release     both wrists  . Shoulder surgery     Right  . Back surgery   . Knee surgery     right and left    History reviewed. No pertinent family history.  History  Substance Use Topics  . Smoking status: Never Smoker   . Smokeless tobacco: Not on file  . Alcohol Use: Yes    Review of Systems  All other systems reviewed and are negative.   except as noted  HPI   Allergies  Percocet and Vicodin  Home Medications   Current Outpatient Rx  Name Route Sig Dispense Refill  . ALLOPURINOL 300 MG PO TABS Oral Take 300 mg by mouth daily.    Marland Kitchen CETIRIZINE HCL 10 MG PO TABS Oral Take 10 mg by mouth daily.    Marland Kitchen ESCITALOPRAM OXALATE 20 MG PO TABS Oral Take 20 mg by mouth daily.    . FENOFIBRATE 160 MG PO TABS Oral Take 160 mg by mouth daily.    Marland Kitchen ZICAM COLD REMEDY NA Nasal Place 1 spray into the nose daily. For sinus    . IBUPROFEN 200 MG PO TABS Oral Take 600 mg by mouth every 6 (six) hours as needed. For headache    . INSULIN REGULAR HUMAN (CONC) 500 UNIT/ML Winfred SOLN Subcutaneous Inject 25 Units into the skin daily. Patient is on pump    . NAPROXEN SODIUM 220 MG PO TABS Oral Take 440 mg by mouth 2 (two) times daily with a meal. For back pain and headache    . RAMIPRIL 10 MG PO CAPS Oral Take 10 mg by mouth daily.      BP 152/98  Pulse 81  Temp(Src) 98.8 F (37.1 C) (Oral)  Resp 18  Ht 5\' 11"  (1.803 m)  Wt 245 lb (111.131 kg)  BMI 34.17 kg/m2  SpO2 93%  Physical Exam  Nursing note  and vitals reviewed. Constitutional: He is oriented to person, place, and time. He appears well-developed and well-nourished. No distress.  HENT:  Head: Atraumatic.  Mouth/Throat: Oropharynx is clear and moist.       No ttp with taping facial and maxillary sinuses  Eyes: Conjunctivae are normal. Pupils are equal, round, and reactive to light.  Neck: Neck supple.  Cardiovascular: Normal rate, regular rhythm, normal heart sounds and intact distal pulses.  Exam reveals no gallop and no friction rub.   No murmur heard. Pulmonary/Chest: Effort normal. No respiratory distress. He has no wheezes. He has no rales.  Abdominal: Soft. Bowel sounds are normal. There is no tenderness. There is no rebound and no guarding.  Musculoskeletal: Normal range of motion. He exhibits no edema and no tenderness.  Neurological: He is alert and oriented to person, place, and time. No  cranial nerve deficit. He exhibits normal muscle tone. Coordination normal.       Strength 5/5 all extremities No pronator drift No facial droop   Skin: Skin is warm and dry.  Psychiatric: He has a normal mood and affect.    ED Course  Procedures (including critical care time)  Labs Reviewed  GLUCOSE, CAPILLARY - Abnormal; Notable for the following:    Glucose-Capillary 256 (*)    All other components within normal limits   Ct Head Wo Contrast  10/21/2011  *RADIOLOGY REPORT*  Clinical Data: Left-sided headaches.  CT HEAD WITHOUT CONTRAST  Technique:  Contiguous axial images were obtained from the base of the skull through the vertex without contrast.  Comparison: Brain MRI 2008.  Findings: The ventricles are normal.  No extra-axial fluid collections are seen.  The brainstem and cerebellum are unremarkable.  No acute intracranial findings such as infarction or hemorrhage.  No mass lesions.  The bony calvarium is intact. Chronic left-sided maxillary sinus disease and prior surgical changes.  The mastoid air cells are clear.  IMPRESSION: No acute intracranial findings or mass lesions.  Original Report Authenticated By: P. Loralie Champagne, M.D.     1. Headache   2. Hyperglycemia      MDM  Presents with a constant headache for the past 2 weeks. It was gradual onset and nature do not suspect subarachnoid hemorrhage, meningitis, encephalitis. He appears well here in the emergency department. His glu is slightly elevated at 256.  We'll obtain a CT scan of his head to rule out tumor with mass effect given his nausea, and length of symptoms without prior history of headaches, and night sweats. IV fluids, Reglan, Benadryl ordered. Reassess.   0130PM CT head unremarkable. States "pain coming back". Pain control, home.  4:19 PM  Appears well, headache resolved. Comfortable with discharge home.    Forbes Cellar, MD 10/21/11 1619  Patient requesting refills all meds except insulin. One month  supply-- needs to f/u with his pMD at [facility].  Forbes Cellar, MD 10/21/11 [phone]

## 2011-10-24 ENCOUNTER — Encounter (HOSPITAL_COMMUNITY): Payer: Self-pay

## 2011-10-24 ENCOUNTER — Emergency Department (HOSPITAL_COMMUNITY)
Admission: EM | Admit: 2011-10-24 | Discharge: 2011-10-24 | Disposition: A | Discharge status: Home / Self Care | Payer: Self-pay | Attending: Emergency Medicine | Admitting: Emergency Medicine

## 2011-10-24 DIAGNOSIS — Z8639 Personal history of other endocrine, nutritional and metabolic disease: Secondary | ICD-10-CM | POA: Insufficient documentation

## 2011-10-24 DIAGNOSIS — Z79899 Other long term (current) drug therapy: Secondary | ICD-10-CM | POA: Insufficient documentation

## 2011-10-24 DIAGNOSIS — R339 Retention of urine, unspecified: Secondary | ICD-10-CM | POA: Insufficient documentation

## 2011-10-24 DIAGNOSIS — Z794 Long term (current) use of insulin: Secondary | ICD-10-CM | POA: Insufficient documentation

## 2011-10-24 DIAGNOSIS — M549 Dorsalgia, unspecified: Secondary | ICD-10-CM | POA: Insufficient documentation

## 2011-10-24 DIAGNOSIS — Z862 Personal history of diseases of the blood and blood-forming organs and certain disorders involving the immune mechanism: Secondary | ICD-10-CM | POA: Insufficient documentation

## 2011-10-24 DIAGNOSIS — E7800 Pure hypercholesterolemia, unspecified: Secondary | ICD-10-CM | POA: Insufficient documentation

## 2011-10-24 DIAGNOSIS — E119 Type 2 diabetes mellitus without complications: Secondary | ICD-10-CM | POA: Insufficient documentation

## 2011-10-24 DIAGNOSIS — R51 Headache: Secondary | ICD-10-CM | POA: Insufficient documentation

## 2011-10-24 LAB — URINALYSIS, ROUTINE W REFLEX MICROSCOPIC
Bilirubin Urine: NEGATIVE
Glucose, UA: 250 mg/dL — AB
Hgb urine dipstick: NEGATIVE
Ketones, ur: NEGATIVE mg/dL
Leukocytes, UA: NEGATIVE
Nitrite: NEGATIVE
Protein, ur: >300 mg/dL — AB
Specific Gravity, Urine: 1.021 (ref 1.005–1.030)
Urobilinogen, UA: 0.2 mg/dL (ref 0.0–1.0)
pH: 5.5 (ref 5.0–8.0)

## 2011-10-24 LAB — POCT I-STAT, CHEM 8
BUN: 16 mg/dL (ref 6–23)
Calcium, Ion: 1.17 mmol/L (ref 1.12–1.32)
Chloride: 104 mEq/L (ref 96–112)
Creatinine, Ser: 0.6 mg/dL (ref 0.50–1.35)
Glucose, Bld: 98 mg/dL (ref 70–99)
HCT: 43 % (ref 39.0–52.0)
Hemoglobin: 14.6 g/dL (ref 13.0–17.0)
Potassium: 3.9 mEq/L (ref 3.5–5.1)
Sodium: 134 mEq/L — ABNORMAL LOW (ref 135–145)
TCO2: 28 mmol/L (ref 0–100)

## 2011-10-24 LAB — URINE MICROSCOPIC-ADD ON

## 2011-10-24 LAB — GLUCOSE, CAPILLARY: Glucose-Capillary: 99 mg/dL (ref 70–99)

## 2011-10-24 NOTE — ED Notes (Signed)
Pt report received from [facility]

## 2011-10-24 NOTE — ED Notes (Signed)
Pt foley was changed from a foley bag to a leg bag

## 2011-10-24 NOTE — ED Notes (Signed)
Leg bag intact and pants dry.

## 2011-10-24 NOTE — ED Notes (Addendum)
UA collected from foley after being clamped. Urine hazy and malodorous.

## 2011-10-24 NOTE — ED Notes (Signed)
Pt seen at Dr. Rinaldo Cloud office yesterday for headaches, had labs drawn, given ultram for pain.  Pt reports inability to void since yesterday.

## 2011-10-24 NOTE — ED Notes (Addendum)
Pt's family member approached the desk to report that the pt's leg bag is leaking and that his pants were soaked. Assessed pt's foley which had been slightly open.  Pt given paper scrubs and cleaned up of urine. Foley leg bag now functioning properly.

## 2011-10-24 NOTE — ED Notes (Signed)
Pt leg bag intact and pants drew.

## 2011-10-24 NOTE — ED Notes (Signed)
Foley bag removed and converted over to leg bag for usage at home.  Cleaning and treatment of the device was explained and demonstrated by [employer].

## 2011-10-24 NOTE — ED Provider Notes (Signed)
History     CSN: [id number]  Arrival date & time 10/24/11  1235   First MD Initiated Contact with Patient 10/24/11 1501      Chief Complaint  Patient presents with  . Urinary Retention    (Consider location/radiation/quality/duration/timing/severity/associated sxs/prior treatment) HPI Comments: Patient was seen in ED 3 days ago for gradual onset of 2 weeks of headache. He followup with Dr. Evlyn Kanner yesterday and was put on Ultram for his pain. He's been unable to urinate since yesterday. He reports bladder pressure and dribbling but no abdominal pain, nausea or vomiting. He states he took about 6 Ultram tablets yesterday. A Foley was placed prior to my evaluation and he had 300 mils of yellow urine. He states his symptoms are completely resolved. He denies any abdominal pain, testicular pain, dysuria, hematuria.  Patient has no previous history of urinary retention or prostate problems.  No change in chronic back pain or character of headaches.  The history is provided by the patient.    Past Medical History  Diagnosis Date  . Diabetes mellitus   . Hypercholesteremia   . Metabolic syndrome   . Hypertension   . Gout   . Pancreatitis     Past Surgical History  Procedure Date  . Carpal tunnel release     both wrists  . Shoulder surgery     Right  . Back surgery   . Knee surgery     right and left    No family history on file.  History  Substance Use Topics  . Smoking status: Never Smoker   . Smokeless tobacco: Not on file  . Alcohol Use: Yes      Review of Systems  Constitutional: Negative for fever, activity change and appetite change.  HENT: Negative for congestion and rhinorrhea.   Eyes: Negative for visual disturbance.  Respiratory: Negative for cough, choking, chest tightness and shortness of breath.   Cardiovascular: Negative for chest pain.  Gastrointestinal: Negative for nausea, vomiting and abdominal pain.  Genitourinary: Positive for difficulty urinating.  Negative for dysuria and hematuria.  Musculoskeletal: Positive for back pain.  Skin: Negative for rash.  Neurological: Negative for dizziness and headaches.    Allergies  Percocet; Tramadol; and Vicodin  Home Medications   Current Outpatient Rx  Name Route Sig Dispense Refill  . ALLOPURINOL 300 MG PO TABS Oral Take 300 mg by mouth daily.    . AMOXICILLIN-POT CLAVULANATE 875-125 MG PO TABS Oral Take 1 tablet by mouth 2 (two) times daily.    Marland Kitchen CETIRIZINE HCL 10 MG PO CHEW Oral Chew 10 mg by mouth daily.    Marland Kitchen ESCITALOPRAM OXALATE 20 MG PO TABS Oral Take 20 mg by mouth daily.    . FENOFIBRATE 160 MG PO TABS Oral Take 160 mg by mouth daily.    . IBUPROFEN 200 MG PO TABS Oral Take 600 mg by mouth every 6 (six) hours as needed. For headache    . INSULIN REGULAR HUMAN (CONC) 500 UNIT/ML [place] SOLN Subcutaneous Inject 0.125 Units into the skin daily. Per hr.Patient is on pump    . NAPROXEN SODIUM 220 MG PO TABS Oral Take 440 mg by mouth 2 (two) times daily with a meal. For back pain and headache    . RAMIPRIL 10 MG PO CAPS Oral Take 1 capsule (10 mg total) by mouth daily. 30 capsule 0  . TRAMADOL HCL 50 MG PO TABS Oral Take 50 mg by mouth every 6 (six) hours as needed. For  pain    . ZICAM COLD REMEDY NA Nasal Place 1 spray into the nose daily. For sinus      BP 118/70  Pulse 81  Temp(Src) 97.7 F (36.5 C) (Oral)  Resp 16  Ht 5\' 11"  (1.803 m)  Wt 240 lb (108.863 kg)  BMI 33.47 kg/m2  SpO2 96%  Physical Exam  Constitutional: He is oriented to person, place, and time. He appears well-developed and well-nourished. No distress.  HENT:  Head: Normocephalic and atraumatic.  Mouth/Throat: Oropharynx is clear and moist. No oropharyngeal exudate.  Eyes: Conjunctivae are normal. Pupils are equal, round, and reactive to light.  Neck: Normal range of motion. Neck supple.  Cardiovascular: Normal rate, regular rhythm and normal heart sounds.   Pulmonary/Chest: Effort normal and breath sounds normal.  No respiratory distress.  Abdominal: Soft. There is no tenderness. There is no rebound and no guarding.  Genitourinary: Penis normal.       No testicular pain or swelling  Musculoskeletal: Normal range of motion. He exhibits no edema and no tenderness.       No CVA tenderness  Neurological: He is alert and oriented to person, place, and time. No cranial nerve deficit.  Skin: Skin is warm.    ED Course  Procedures (including critical care time)  Labs Reviewed  URINALYSIS, ROUTINE W REFLEX MICROSCOPIC - Abnormal; Notable for the following:    APPearance HAZY (*)    Glucose, UA 250 (*)    Protein, ur >300 (*)    All other components within normal limits  URINE MICROSCOPIC-ADD ON - Abnormal; Notable for the following:    Casts HYALINE CASTS (*)    All other components within normal limits  POCT I-STAT, CHEM 8 - Abnormal; Notable for the following:    Sodium 134 (*)    All other components within normal limits  GLUCOSE, CAPILLARY   No results found.   1. Urinary retention       MDM  Urinary retention is most likely secondary to tramadol use. Relieved with Foley catheter. Abdomen soft and nontender.  Discontinue tramadol, check urinalysis, followup with urology.  Creatinine within normal limits. No urinalysis infection. Followup with urology for urinary retention     Glynn Octave, MD 10/24/11 2039

## 2011-10-25 ENCOUNTER — Telehealth: Payer: Self-pay | Admitting: Neurology

## 2011-10-25 NOTE — Telephone Encounter (Signed)
Pt experiencing severe headaches with multiple hospital visits. Dr. Rinaldo Cloud office would like to know if we can work him in?

## 2011-10-25 NOTE — Telephone Encounter (Signed)
yes

## 2011-10-28 NOTE — Telephone Encounter (Signed)
Spoke with Sherrian Divers at Dr. Rinaldo Cloud office. Pt no longer needs appointment due to financial situation. Dr. Evlyn Kanner is going to try to treat his headaches himself.

## 2014-04-26 ENCOUNTER — Emergency Department (HOSPITAL_BASED_OUTPATIENT_CLINIC_OR_DEPARTMENT_OTHER): Payer: BC Managed Care – PPO

## 2014-04-26 ENCOUNTER — Inpatient Hospital Stay (HOSPITAL_BASED_OUTPATIENT_CLINIC_OR_DEPARTMENT_OTHER)
Admission: EM | Admit: 2014-04-26 | Discharge: 2014-04-28 | DRG: 247 | Disposition: A | Discharge status: Home / Self Care | Payer: BC Managed Care – PPO | Attending: Internal Medicine | Admitting: Internal Medicine

## 2014-04-26 ENCOUNTER — Encounter (HOSPITAL_BASED_OUTPATIENT_CLINIC_OR_DEPARTMENT_OTHER): Payer: Self-pay | Admitting: Emergency Medicine

## 2014-04-26 DIAGNOSIS — E1165 Type 2 diabetes mellitus with hyperglycemia: Secondary | ICD-10-CM

## 2014-04-26 DIAGNOSIS — Z79899 Other long term (current) drug therapy: Secondary | ICD-10-CM | POA: Diagnosis not present

## 2014-04-26 DIAGNOSIS — Z8249 Family history of ischemic heart disease and other diseases of the circulatory system: Secondary | ICD-10-CM | POA: Diagnosis not present

## 2014-04-26 DIAGNOSIS — E8881 Metabolic syndrome: Secondary | ICD-10-CM | POA: Diagnosis present

## 2014-04-26 DIAGNOSIS — M109 Gout, unspecified: Secondary | ICD-10-CM | POA: Diagnosis present

## 2014-04-26 DIAGNOSIS — R0789 Other chest pain: Secondary | ICD-10-CM | POA: Diagnosis present

## 2014-04-26 DIAGNOSIS — I2510 Atherosclerotic heart disease of native coronary artery without angina pectoris: Secondary | ICD-10-CM | POA: Diagnosis present

## 2014-04-26 DIAGNOSIS — F329 Major depressive disorder, single episode, unspecified: Secondary | ICD-10-CM | POA: Diagnosis present

## 2014-04-26 DIAGNOSIS — E781 Pure hyperglyceridemia: Secondary | ICD-10-CM | POA: Diagnosis present

## 2014-04-26 DIAGNOSIS — E782 Mixed hyperlipidemia: Secondary | ICD-10-CM | POA: Diagnosis present

## 2014-04-26 DIAGNOSIS — Z794 Long term (current) use of insulin: Secondary | ICD-10-CM

## 2014-04-26 DIAGNOSIS — F32A Depression, unspecified: Secondary | ICD-10-CM | POA: Diagnosis present

## 2014-04-26 DIAGNOSIS — E7800 Pure hypercholesterolemia, unspecified: Secondary | ICD-10-CM | POA: Diagnosis present

## 2014-04-26 DIAGNOSIS — Z955 Presence of coronary angioplasty implant and graft: Secondary | ICD-10-CM

## 2014-04-26 DIAGNOSIS — I200 Unstable angina: Secondary | ICD-10-CM | POA: Diagnosis present

## 2014-04-26 DIAGNOSIS — E669 Obesity, unspecified: Secondary | ICD-10-CM | POA: Diagnosis present

## 2014-04-26 DIAGNOSIS — Z7982 Long term (current) use of aspirin: Secondary | ICD-10-CM | POA: Diagnosis not present

## 2014-04-26 DIAGNOSIS — F3289 Other specified depressive episodes: Secondary | ICD-10-CM | POA: Diagnosis present

## 2014-04-26 DIAGNOSIS — IMO0001 Reserved for inherently not codable concepts without codable children: Secondary | ICD-10-CM | POA: Diagnosis present

## 2014-04-26 DIAGNOSIS — E118 Type 2 diabetes mellitus with unspecified complications: Secondary | ICD-10-CM

## 2014-04-26 DIAGNOSIS — Z7902 Long term (current) use of antithrombotics/antiplatelets: Secondary | ICD-10-CM | POA: Diagnosis not present

## 2014-04-26 DIAGNOSIS — Z885 Allergy status to narcotic agent status: Secondary | ICD-10-CM

## 2014-04-26 DIAGNOSIS — Z6835 Body mass index (BMI) 35.0-35.9, adult: Secondary | ICD-10-CM | POA: Diagnosis not present

## 2014-04-26 DIAGNOSIS — Z833 Family history of diabetes mellitus: Secondary | ICD-10-CM | POA: Diagnosis not present

## 2014-04-26 DIAGNOSIS — E119 Type 2 diabetes mellitus without complications: Secondary | ICD-10-CM

## 2014-04-26 DIAGNOSIS — Z9289 Personal history of other medical treatment: Secondary | ICD-10-CM

## 2014-04-26 HISTORY — DX: Mixed hyperlipidemia: E78.2

## 2014-04-26 HISTORY — DX: Gout, unspecified: M10.9

## 2014-04-26 HISTORY — DX: Other chronic pain: G89.29

## 2014-04-26 HISTORY — DX: Unspecified osteoarthritis, unspecified site: M19.90

## 2014-04-26 HISTORY — DX: Low back pain, unspecified: M54.50

## 2014-04-26 HISTORY — DX: Type 2 diabetes mellitus without complications: E11.9

## 2014-04-26 HISTORY — DX: Depression, unspecified: F32.A

## 2014-04-26 HISTORY — DX: Atherosclerotic heart disease of native coronary artery without angina pectoris: I25.10

## 2014-04-26 HISTORY — DX: Major depressive disorder, single episode, unspecified: F32.9

## 2014-04-26 HISTORY — DX: Low back pain: M54.5

## 2014-04-26 HISTORY — DX: Personal history of other medical treatment: Z92.89

## 2014-04-26 LAB — CBC WITH DIFFERENTIAL/PLATELET
Basophils Absolute: 0.1 10*3/uL (ref 0.0–0.1)
Basophils Relative: 1 % (ref 0–1)
Eosinophils Absolute: 0.1 10*3/uL (ref 0.0–0.7)
Eosinophils Relative: 1 % (ref 0–5)
HCT: 44.9 % (ref 39.0–52.0)
Hemoglobin: 15.8 g/dL (ref 13.0–17.0)
Lymphocytes Relative: 33 % (ref 12–46)
Lymphs Abs: 2.9 10*3/uL (ref 0.7–4.0)
MCH: 30.6 pg (ref 26.0–34.0)
MCHC: 35.2 g/dL (ref 30.0–36.0)
MCV: 87 fL (ref 78.0–100.0)
Monocytes Absolute: 0.8 10*3/uL (ref 0.1–1.0)
Monocytes Relative: 9 % (ref 3–12)
Neutro Abs: 4.7 10*3/uL (ref 1.7–7.7)
Neutrophils Relative %: 55 % (ref 43–77)
Platelets: 267 10*3/uL (ref 150–400)
RBC: 5.16 MIL/uL (ref 4.22–5.81)
RDW: 13.8 % (ref 11.5–15.5)
WBC: 7.8 10*3/uL (ref 4.0–10.5)

## 2014-04-26 LAB — COMPREHENSIVE METABOLIC PANEL
ALT: 29 U/L (ref 0–53)
AST: 56 U/L — ABNORMAL HIGH (ref 0–37)
Albumin: 3.2 g/dL — ABNORMAL LOW (ref 3.5–5.2)
Alkaline Phosphatase: 50 U/L (ref 39–117)
Anion gap: 20 — ABNORMAL HIGH (ref 5–15)
BUN: 23 mg/dL (ref 6–23)
CO2: 16 mEq/L — ABNORMAL LOW (ref 19–32)
Calcium: 9.2 mg/dL (ref 8.4–10.5)
Chloride: 98 mEq/L (ref 96–112)
Creatinine, Ser: 0.76 mg/dL (ref 0.50–1.35)
GFR calc Af Amer: >90 mL/min (ref >90)
GFR calc non Af Amer: >90 mL/min (ref >90)
Glucose, Bld: 214 mg/dL — ABNORMAL HIGH (ref 70–99)
Potassium: 4.7 mEq/L (ref 3.7–5.3)
Sodium: 134 mEq/L — ABNORMAL LOW (ref 137–147)
Total Bilirubin: 0.2 mg/dL — ABNORMAL LOW (ref 0.3–1.2)
Total Protein: 7.7 g/dL (ref 6.0–8.3)

## 2014-04-26 LAB — TSH: TSH: 1.59 u[IU]/mL (ref 0.350–4.500)

## 2014-04-26 LAB — HEPARIN LEVEL (UNFRACTIONATED)
Heparin Unfractionated: <0.1 IU/mL — ABNORMAL LOW (ref 0.30–0.70)
Heparin Unfractionated: <0.1 IU/mL — ABNORMAL LOW (ref 0.30–0.70)

## 2014-04-26 LAB — PROTIME-INR
INR: 1.05 (ref 0.00–1.49)
Prothrombin Time: 13.7 seconds (ref 11.6–15.2)

## 2014-04-26 LAB — PRO B NATRIURETIC PEPTIDE: Pro B Natriuretic peptide (BNP): 16.1 pg/mL (ref 0–125)

## 2014-04-26 LAB — GLUCOSE, CAPILLARY: Glucose-Capillary: 103 mg/dL — ABNORMAL HIGH (ref 70–99)

## 2014-04-26 LAB — MAGNESIUM: Magnesium: 1.8 mg/dL (ref 1.5–2.5)

## 2014-04-26 LAB — D-DIMER, QUANTITATIVE: D-Dimer, Quant: 0.9 ug/mL-FEU — ABNORMAL HIGH (ref 0.00–0.48)

## 2014-04-26 LAB — MRSA PCR SCREENING: MRSA by PCR: NEGATIVE

## 2014-04-26 LAB — CBG MONITORING, ED: Glucose-Capillary: 52 mg/dL — ABNORMAL LOW (ref 70–99)

## 2014-04-26 LAB — TROPONIN I
Troponin I: <0.3 ng/mL (ref <0.30)
Troponin I: <0.3 ng/mL (ref <0.30)

## 2014-04-26 LAB — APTT: aPTT: 32 seconds (ref 24–37)

## 2014-04-26 MED ORDER — FENOFIBRATE 160 MG PO TABS
160.0000 mg | ORAL_TABLET | Freq: Every day | ORAL | Status: DC
  Administered 2014-04-27 – 2014-04-28 (×2): 160 mg via ORAL
  Filled 2014-04-26 (×2): qty 1

## 2014-04-26 MED ORDER — NITROGLYCERIN IN D5W 200-5 MCG/ML-% IV SOLN
10.0000 ug/min | INTRAVENOUS | Status: DC
  Administered 2014-04-26: 10 ug/min via INTRAVENOUS
  Filled 2014-04-26: qty 250

## 2014-04-26 MED ORDER — ONDANSETRON HCL 4 MG/2ML IJ SOLN: 4.0000 mg | Freq: Four times a day (QID) | INTRAMUSCULAR | Status: DC | PRN

## 2014-04-26 MED ORDER — SODIUM CHLORIDE 0.9 % IV SOLN
INTRAVENOUS | Status: DC
  Administered 2014-04-26: 21:00:00 via INTRAVENOUS

## 2014-04-26 MED ORDER — EMPAGLIFLOZIN 10 MG PO TABS
10.0000 mg | ORAL_TABLET | Freq: Every day | ORAL | Status: DC
  Administered 2014-04-28: 10:00:00 10 mg via ORAL
  Filled 2014-04-26: qty 1

## 2014-04-26 MED ORDER — ASPIRIN 81 MG PO CHEW
81.0000 mg | CHEWABLE_TABLET | ORAL | Status: AC
  Administered 2014-04-27: 81 mg via ORAL
  Filled 2014-04-26: qty 1

## 2014-04-26 MED ORDER — SODIUM CHLORIDE 0.9 % IV SOLN
1.0000 mL/kg/h | INTRAVENOUS | Status: DC
  Administered 2014-04-27 (×2): 1 mL/kg/h via INTRAVENOUS

## 2014-04-26 MED ORDER — MORPHINE SULFATE 4 MG/ML IJ SOLN
4.0000 mg | Freq: Once | INTRAMUSCULAR | Status: AC
  Administered 2014-04-26: 4 mg via INTRAVENOUS

## 2014-04-26 MED ORDER — HEPARIN BOLUS VIA INFUSION
4000.0000 [IU] | Freq: Once | INTRAVENOUS | Status: AC
  Administered 2014-04-26 (×2): 4000 [IU] via INTRAVENOUS

## 2014-04-26 MED ORDER — SODIUM CHLORIDE 0.9 % IJ SOLN: 3.0000 mL | INTRAMUSCULAR | Status: DC | PRN

## 2014-04-26 MED ORDER — HEPARIN (PORCINE) IN NACL 100-0.45 UNIT/ML-% IJ SOLN
2100.0000 [IU]/h | INTRAMUSCULAR | Status: DC
  Administered 2014-04-26 (×2): 1400 [IU]/h via INTRAVENOUS
  Administered 2014-04-27: 2100 [IU]/h via INTRAVENOUS
  Administered 2014-04-27: 1800 [IU]/h via INTRAVENOUS
  Filled 2014-04-26 (×3): qty 250

## 2014-04-26 MED ORDER — SODIUM CHLORIDE 0.9 % IJ SOLN
3.0000 mL | Freq: Two times a day (BID) | INTRAMUSCULAR | Status: DC
  Administered 2014-04-26: 3 mL via INTRAVENOUS

## 2014-04-26 MED ORDER — ASPIRIN 81 MG PO CHEW
324.0000 mg | CHEWABLE_TABLET | Freq: Once | ORAL | Status: AC
  Administered 2014-04-26: 324 mg via ORAL
  Filled 2014-04-26: qty 4

## 2014-04-26 MED ORDER — INSULIN ASPART 100 UNIT/ML ~~LOC~~ SOLN
0.0000 [IU] | SUBCUTANEOUS | Status: DC
  Administered 2014-04-27 (×2): 2 [IU] via SUBCUTANEOUS

## 2014-04-26 MED ORDER — NITROGLYCERIN 0.4 MG SL SUBL
SUBLINGUAL_TABLET | SUBLINGUAL | Status: AC
  Administered 2014-04-26: 0.4 mg
  Filled 2014-04-26: qty 3

## 2014-04-26 MED ORDER — ATORVASTATIN CALCIUM 80 MG PO TABS
80.0000 mg | ORAL_TABLET | Freq: Every day | ORAL | Status: DC
  Administered 2014-04-26: 80 mg via ORAL
  Filled 2014-04-26 (×3): qty 1

## 2014-04-26 MED ORDER — ASPIRIN EC 81 MG PO TBEC
81.0000 mg | DELAYED_RELEASE_TABLET | Freq: Every day | ORAL | Status: DC
  Administered 2014-04-28: 81 mg via ORAL
  Filled 2014-04-26: qty 1

## 2014-04-26 MED ORDER — ESCITALOPRAM OXALATE 20 MG PO TABS
20.0000 mg | ORAL_TABLET | Freq: Every day | ORAL | Status: DC
  Administered 2014-04-27 – 2014-04-28 (×2): 20 mg via ORAL
  Filled 2014-04-26 (×2): qty 1

## 2014-04-26 MED ORDER — METOPROLOL TARTRATE 25 MG PO TABS
25.0000 mg | ORAL_TABLET | Freq: Two times a day (BID) | ORAL | Status: DC
  Administered 2014-04-26 – 2014-04-28 (×4): 25 mg via ORAL
  Filled 2014-04-26 (×5): qty 1

## 2014-04-26 MED ORDER — SODIUM CHLORIDE 0.9 % IV SOLN: 250.0000 mL | INTRAVENOUS | Status: DC | PRN

## 2014-04-26 MED ORDER — NITROGLYCERIN IN D5W 200-5 MCG/ML-% IV SOLN: 10.0000 ug/min | INTRAVENOUS | Status: DC

## 2014-04-26 MED ORDER — INSULIN ASPART 100 UNIT/ML ~~LOC~~ SOLN: 0.0000 [IU] | Freq: Three times a day (TID) | SUBCUTANEOUS | Status: DC

## 2014-04-26 MED ORDER — LORATADINE 10 MG PO TABS
10.0000 mg | ORAL_TABLET | Freq: Every day | ORAL | Status: DC
  Administered 2014-04-27 – 2014-04-28 (×2): 10 mg via ORAL
  Filled 2014-04-26 (×2): qty 1

## 2014-04-26 MED ORDER — ZOLPIDEM TARTRATE 5 MG PO TABS: 5.0000 mg | ORAL_TABLET | Freq: Every evening | ORAL | Status: DC | PRN

## 2014-04-26 MED ORDER — NITROGLYCERIN 0.4 MG SL SUBL
SUBLINGUAL_TABLET | SUBLINGUAL | Status: AC
  Administered 2014-04-26: 17:00:00
  Filled 2014-04-26: qty 3

## 2014-04-26 MED ORDER — HEPARIN BOLUS VIA INFUSION
2000.0000 [IU] | Freq: Once | INTRAVENOUS | Status: AC
  Administered 2014-04-26: 2000 [IU] via INTRAVENOUS
  Filled 2014-04-26: qty 2000

## 2014-04-26 MED ORDER — ALPRAZOLAM 0.25 MG PO TABS: 0.2500 mg | ORAL_TABLET | Freq: Two times a day (BID) | ORAL | Status: DC | PRN

## 2014-04-26 MED ORDER — INSULIN ASPART 100 UNIT/ML ~~LOC~~ SOLN: 0.0000 [IU] | Freq: Every day | SUBCUTANEOUS | Status: DC

## 2014-04-26 MED ORDER — ASPIRIN 81 MG PO CHEW
324.0000 mg | CHEWABLE_TABLET | ORAL | Status: AC
  Administered 2014-04-26: 324 mg via ORAL
  Filled 2014-04-26: qty 4

## 2014-04-26 MED ORDER — RAMIPRIL 10 MG PO CAPS
10.0000 mg | ORAL_CAPSULE | Freq: Every day | ORAL | Status: DC
  Administered 2014-04-28: 10:00:00 10 mg via ORAL
  Filled 2014-04-26 (×2): qty 1

## 2014-04-26 MED ORDER — METOPROLOL TARTRATE 1 MG/ML IV SOLN
2.5000 mg | INTRAVENOUS | Status: AC
  Administered 2014-04-26: 2.5 mg via INTRAVENOUS
  Filled 2014-04-26: qty 5

## 2014-04-26 MED ORDER — ASPIRIN 300 MG RE SUPP
300.0000 mg | RECTAL | Status: AC
  Filled 2014-04-26: qty 1

## 2014-04-26 MED ORDER — IOHEXOL 350 MG/ML SOLN
100.0000 mL | Freq: Once | INTRAVENOUS | Status: AC | PRN
  Administered 2014-04-26: 100 mL via INTRAVENOUS

## 2014-04-26 MED ORDER — MORPHINE SULFATE 4 MG/ML IJ SOLN
INTRAMUSCULAR | Status: AC
  Administered 2014-04-26: 4 mg via INTRAVENOUS
  Filled 2014-04-26: qty 1

## 2014-04-26 MED ORDER — ONDANSETRON HCL 4 MG/2ML IJ SOLN
INTRAMUSCULAR | Status: AC
  Administered 2014-04-26: 4 mg via INTRAVENOUS
  Filled 2014-04-26: qty 2

## 2014-04-26 MED ORDER — HYDROCODONE-ACETAMINOPHEN 10-325 MG PO TABS
1.0000 | ORAL_TABLET | Freq: Four times a day (QID) | ORAL | Status: DC | PRN
  Administered 2014-04-27 (×2): 1 via ORAL
  Filled 2014-04-26 (×2): qty 1

## 2014-04-26 MED ORDER — ALLOPURINOL 300 MG PO TABS
300.0000 mg | ORAL_TABLET | Freq: Every day | ORAL | Status: DC
  Administered 2014-04-27 – 2014-04-28 (×2): 300 mg via ORAL
  Filled 2014-04-26 (×2): qty 1

## 2014-04-26 MED ORDER — ICOSAPENT ETHYL 1 G PO CAPS
4.0000 g | ORAL_CAPSULE | Freq: Every day | ORAL | Status: DC
  Administered 2014-04-28: 4 g via ORAL
  Filled 2014-04-26: qty 4

## 2014-04-26 MED ORDER — ONDANSETRON HCL 4 MG/2ML IJ SOLN
4.0000 mg | Freq: Once | INTRAMUSCULAR | Status: AC
  Administered 2014-04-26: 4 mg via INTRAVENOUS

## 2014-04-26 MED ORDER — NITROGLYCERIN 0.4 MG SL SUBL: 0.4000 mg | SUBLINGUAL_TABLET | SUBLINGUAL | Status: DC | PRN

## 2014-04-26 MED ORDER — INSULIN ASPART 100 UNIT/ML ~~LOC~~ SOLN
4.0000 [IU] | Freq: Three times a day (TID) | SUBCUTANEOUS | Status: DC
  Administered 2014-04-27 – 2014-04-28 (×3): 4 [IU] via SUBCUTANEOUS

## 2014-04-26 MED ORDER — ACETAMINOPHEN 325 MG PO TABS: 650.0000 mg | ORAL_TABLET | ORAL | Status: DC | PRN

## 2014-04-26 MED ORDER — SODIUM CHLORIDE 0.9 % IV BOLUS (SEPSIS)
250.0000 mL | Freq: Once | INTRAVENOUS | Status: AC
  Administered 2014-04-26: 250 mL via INTRAVENOUS

## 2014-04-26 NOTE — ED Notes (Signed)
Pt sts he was performing his usual routine at work and began feeling a left chest pressure, SOB, cold sweats, and dizziness. Pt sts left arm now feels numb.

## 2014-04-26 NOTE — ED Notes (Signed)
Pt denies recent travel. Pt has taken 1 asa 81mg  today.

## 2014-04-26 NOTE — ED Notes (Signed)
Report called to [place], RN [facility],.

## 2014-04-26 NOTE — ED Provider Notes (Signed)
CSN: [id number]     Arrival date & time 04/26/14  1049 History   First MD Initiated Contact with Patient 04/26/14 1057     Chief Complaint  Patient presents with  . Chest Pain     (Consider location/radiation/quality/duration/timing/severity/associated sxs/prior Treatment) HPI Comments: Patient presents to the ER for evaluation of chest pain. Patient had onset of left-sided chest pain that he describes as a pressure while at work. He was not particularly exerting himself when this occurred. Patient reported shortness of breath, diaphoresis, dizziness, nausea with the symptoms. He reports that lasted approximately 5 minutes and then eased off. Since then has been coming and going. He has some "numbness" in the left arm now.  Patient has multiple cardiac risk factors. He is a diabetic with severe hypercholesterolemia. Patient has a significant family history as well, mother had a heart attack in her early 46s.  Patient is a 53 y.o. male presenting with chest pain.  Chest Pain Associated symptoms: diaphoresis, nausea and shortness of breath     Past Medical History  Diagnosis Date  . Diabetes mellitus   . Hypercholesteremia   . Metabolic syndrome   . Gout   . Pancreatitis    Past Surgical History  Procedure Laterality Date  . Carpal tunnel release      both wrists  . Shoulder surgery      Right  . Back surgery    . Knee surgery      right and left   No family history on file. History  Substance Use Topics  . Smoking status: Never Smoker   . Smokeless tobacco: Not on file  . Alcohol Use: Yes    Review of Systems  Constitutional: Positive for diaphoresis.  Respiratory: Positive for shortness of breath.   Cardiovascular: Positive for chest pain.  Gastrointestinal: Positive for nausea.  All other systems reviewed and are negative.     Allergies  Percocet  Home Medications   Prior to Admission medications   Medication Sig Start Date End Date Taking? Authorizing  Provider  HYDROcodone-acetaminophen (NORCO) 10-325 MG per tablet Take 1 tablet by mouth every 6 (six) hours as needed.   Yes Historical Provider, MD  allopurinol (ZYLOPRIM) 300 MG tablet Take 300 mg by mouth daily. 10/21/11 10/20/12  Blair Heys, MD  amoxicillin-clavulanate (AUGMENTIN) 875-125 MG per tablet Take 1 tablet by mouth 2 (two) times daily.    Historical Provider, MD  cetirizine (ZYRTEC) 10 MG chewable tablet Chew 10 mg by mouth daily. 10/21/11 10/20/12  Blair Heys, MD  escitalopram (LEXAPRO) 20 MG tablet Take 20 mg by mouth daily. 10/21/11 01/19/12  Blair Heys, MD  fenofibrate 160 MG tablet Take 160 mg by mouth daily. 10/21/11 10/20/12  Blair Heys, MD  Homeopathic Products [facility] COLD REMEDY NA) Place 1 spray into the nose daily. For sinus    Historical Provider, MD  ibuprofen (ADVIL,MOTRIN) 200 MG tablet Take 600 mg by mouth every 6 (six) hours as needed. For headache    Historical Provider, MD  insulin regular human CONCENTRATED (HUMULIN R) 500 UNIT/ML SOLN injection Inject 0.125 Units into the skin daily. Per hr.Patient is on pump    Historical Provider, MD  naproxen sodium (ANAPROX) 220 MG tablet Take 440 mg by mouth 2 (two) times daily with a meal. For back pain and headache    Historical Provider, MD  ramipril (ALTACE) 10 MG capsule Take 1 capsule (10 mg total) by mouth daily. 10/21/11 10/20/12  Blair Heys, MD  traMADol Veatrice Bourbon)  50 MG tablet Take 50 mg by mouth every 6 (six) hours as needed. For pain    Historical Provider, MD   BP 109/70  Pulse 110  Temp(Src) 99.1 F (37.3 C) (Oral)  Resp 20  Ht 5\' 11"  (1.803 m)  Wt 257 lb (116.574 kg)  BMI 35.86 kg/m2  SpO2 98% Physical Exam  Constitutional: He is oriented to person, place, and time. He appears well-developed and well-nourished. No distress.  HENT:  Head: Normocephalic and atraumatic.  Right Ear: Hearing normal.  Left Ear: Hearing normal.  Nose: Nose normal.  Mouth/Throat: Oropharynx is clear and moist and mucous  membranes are normal.  Eyes: Conjunctivae and EOM are normal. Pupils are equal, round, and reactive to light.  Neck: Normal range of motion. Neck supple.  Cardiovascular: Regular rhythm, S1 normal and S2 normal.  Exam reveals no gallop and no friction rub.   No murmur heard. Pulmonary/Chest: Effort normal and breath sounds normal. No respiratory distress. He exhibits no tenderness.  Abdominal: Soft. Normal appearance and bowel sounds are normal. There is no hepatosplenomegaly. There is no tenderness. There is no rebound, no guarding, no tenderness at McBurney's point and negative Murphy's sign. No hernia.  Musculoskeletal: Normal range of motion.  Neurological: He is alert and oriented to person, place, and time. He has normal strength. No cranial nerve deficit or sensory deficit. Coordination normal. GCS eye subscore is 4. GCS verbal subscore is 5. GCS motor subscore is 6.  Skin: Skin is warm, dry and intact. No rash noted. No cyanosis.  Psychiatric: He has a normal mood and affect. His speech is normal and behavior is normal. Thought content normal.    ED Course  Procedures (including critical care time) Labs Review Labs Reviewed  CBC WITH DIFFERENTIAL  COMPREHENSIVE METABOLIC PANEL  TROPONIN I  PRO B NATRIURETIC PEPTIDE    Imaging Review Dg Chest 2 View  04/26/2014   CLINICAL DATA:  Chest pain  EXAM: CHEST  2 VIEW  COMPARISON:  07/21/2009  FINDINGS: Normal heart size and vascularity. No focal pneumonia, collapse or consolidation. Negative for edema, effusion or pneumothorax. Trachea midline. Degenerative changes of the spine.  IMPRESSION: Stable exam.  No acute process   Electronically Signed   By: Daryll Brod M.D.   On: 04/26/2014 11:31      EKG Interpretation  Date/Time:  Tuesday April 26 2014 10:54:59 EDT Ventricular Rate:  109 PR Interval:  154 QRS Duration: 72 QT Interval:  320 QTC Calculation: 430 R Axis:   52 Text Interpretation:  Sinus tachycardia Otherwise normal  ECG Confirmed by Auston Halfmann  MD, Hailly Fess ([id number]) on 04/26/2014 11:41:38 AM       EKG Interpretation  Date/Time:  Tuesday April 26 2014 12:20:08 EDT Ventricular Rate:  109 PR Interval:  142 QRS Duration: 80 QT Interval:  338 QTC Calculation: 455 R Axis:   68 Text Interpretation:  Sinus tachycardia Otherwise normal ECG No significant change since last tracing Confirmed by Jeris Roser  MD, Loney Peto [phone]) on 04/26/2014 12:30:17 PM       MDM   Final diagnoses:  None   unstable angina   Patient presented to the ER for evaluation of chest pain. Patient had onset of chest pain, shortness of breath, diaphoresis, pain radiating to left arm while at work. Patient does not have any known cardiac disease but does have multiple cardiac risk factors. EKG arrival showed sinus tachycardia, no evidence of ischemia or infarct. He has serial EKGs here in the ER for  recurrent chest pain, there were no changes with any EKG.  There was significant delay in getting the patient's labs because they needed to be sent to [facility] for Ultracentrifugation due to the content of his blood. This level of hyperlipidemia certainly is concerning for a cardiac risk factor. Because of the significant delay in getting the labs back, the nature of his pain, especially with the recurring multiple times here in the ER, I opted to initiate him on heparin prior to resolving of the labs. He did have an elevated d-dimer, was sent for CT angiography to rule out PE because of chest pain, shortness of breath and tachycardia. No evidence of PE was seen.  Patient was administered nitroglycerin for chest pain he had hearing ER he did have improvement. He is currently pain-free. Patient will require hospitalization for further management of chest pain, possible unstable angina.  CRITICAL CARE Performed by: Orpah Greek   Total critical care time: 60min  Critical care time was exclusive of separately billable  procedures and treating other patients.  Critical care was necessary to treat or prevent imminent or life-threatening deterioration.  Critical care was time spent personally by me on the following activities: development of treatment plan with patient and/or surrogate as well as nursing, discussions with consultants, evaluation of patient's response to treatment, examination of patient, obtaining history from patient or surrogate, ordering and performing treatments and interventions, ordering and review of laboratory studies, ordering and review of radiographic studies, pulse oximetry and re-evaluation of patient's condition.    Orpah Greek, MD 04/26/14 [phone]

## 2014-04-26 NOTE — H&P (Signed)
Primary care provider: Dr. Reynold Bowen  Clinical Summary Alec [name] is a 53 y.o.male with history outlined below, accepted in transfer from [facility] by Dr. Marlou Porch for further evaluation of chest pain. He states that he developed chest "pressure" this morning at work (nonstrenuous activity) associated with dyspnea, diaphoresis and lightheadedness. Symptoms have waxed and waned since that time, better with NTG and Heparin infusions. He states that the symptoms are new and that he does not have regular exertional limitations.  Chest CTA (d-dimer 0/90) demonstrated no pulmonary embolus or other acute disease. Minimal aortic atherosclerosis was noted, but no definite coronary artery calcifications were seen. Intial troponin I is negative and ECG shows nonspecific T wave changes.  He has not undergone prior ischemic cardiac workup.  Allergies  Allergen Reactions  . Percocet [Oxycodone-Acetaminophen] Nausea And Vomiting    Makes patient feel not with it    Home Medications Prescriptions prior to admission  Medication Sig Dispense Refill  . allopurinol (ZYLOPRIM) 300 MG tablet Take 300 mg by mouth daily.      Marland Kitchen aspirin EC 81 MG tablet Take 81 mg by mouth daily.      . cetirizine (ZYRTEC) 10 MG chewable tablet Chew 10 mg by mouth daily.      Marland Kitchen escitalopram (LEXAPRO) 20 MG tablet Take 20 mg by mouth daily.      . fenofibrate 160 MG tablet Take 160 mg by mouth daily.      Marland Kitchen HYDROcodone-acetaminophen (NORCO) 10-325 MG per tablet Take 1 tablet by mouth every 6 (six) hours as needed (pain).       Marland Kitchen ibuprofen (ADVIL,MOTRIN) 200 MG tablet Take 600 mg by mouth every 6 (six) hours as needed. For headache      . insulin regular human CONCENTRATED (HUMULIN R) 500 UNIT/ML SOLN injection Inject 30 Units into the skin 2 (two) times daily.       Marland Kitchen JARDIANCE 10 MG TABS Take 10 mg by mouth daily.      . naproxen sodium (ANAPROX) 220 MG tablet Take 440 mg by mouth 2 (two) times daily with a  meal. For back pain and headache      . ramipril (ALTACE) 10 MG capsule Take 1 capsule (10 mg total) by mouth daily.  30 capsule  0  . VASCEPA 1 G CAPS Take 4 g by mouth daily.        Past Medical History  Diagnosis Date  . Type 2 diabetes mellitus   . Mixed hyperlipidemia   . Metabolic syndrome   . Gout   . Pancreatitis     Past Surgical History  Procedure Laterality Date  . Carpal tunnel release Bilateral   . Shoulder surgery Right   . Back surgery    . Knee surgery Bilateral     Family History  Problem Relation Age of Onset  . Coronary artery disease Mother     MI in her 69's    Social History Mr. Corpening reports that he has never smoked. He does not have any smokeless tobacco history on file. Mr. Attar reports that he drinks alcohol.  Review of Systems No fevers or chills, no cough, no palpitations, no syncope, stable appetite, no bleeding problems. Other systems reviewed and negative.  Physical Examination Temp:  [98.1 F (36.7 C)-99.1 F (37.3 C)] 98.1 F (36.7 C) (08/11 1835) Pulse Rate:  [76-110] 80 (08/11 1835) Resp:  [14-22] 15 (08/11 1835) BP: (89-119)/(36-70) 109/65 mmHg (08/11 1835) SpO2:  [  95 %-100 %] 95 % (08/11 1835) Weight:  [253 lb 12 oz (115.1 kg)-257 lb (116.574 kg)] 253 lb 12 oz (115.1 kg) (08/11 1835)  Intake/Output Summary (Last 24 hours) at 04/26/14 2006 Last data filed at 04/26/14 1643  Gross per 24 hour  Intake   1550 ml  Output    500 ml  Net   1050 ml   Telemetry: sinus rhythm.  Overweight male, no distress. HEENT: Conjunctiva and lids normal, oropharynx clear. Neck: Supple, no elevated JVP or carotid bruits, no thyromegaly. Lungs: Clear to auscultation, nonlabored breathing at rest. Cardiac: Regular rate and rhythm, no S3 or significant systolic murmur, no pericardial rub. Abdomen: Soft, nontender, bowel sounds present, no guarding or rebound. Extremities: No pitting edema, distal pulses 2+. Skin: Warm and  dry. Musculoskeletal: No kyphosis. Neuropsychiatric: Alert and oriented x3, affect grossly appropriate.   Lab Results  Basic Metabolic Panel:  Recent Labs Lab 04/26/14 1211  NA 134*  K 4.7  CL 98  CO2 16*  GLUCOSE 214*  BUN 23  CREATININE 0.76  CALCIUM 9.2    Liver Function Tests:  Recent Labs Lab 04/26/14 1211  AST 56*  ALT 29  ALKPHOS 50  BILITOT 0.2*  PROT 7.7  ALBUMIN 3.2*    CBC:  Recent Labs Lab 04/26/14 1130  WBC 7.8  NEUTROABS 4.7  HGB 15.8  HCT 44.9  MCV 87.0  PLT 267    Cardiac Enzymes:  Recent Labs Lab 04/26/14 1211  TROPONINI <0.30    Imaging CHEST 2 VIEW  COMPARISON: 07/21/2009  FINDINGS: Normal heart size and vascularity. No focal pneumonia, collapse or consolidation. Negative for edema, effusion or pneumothorax. Trachea midline. Degenerative changes of the spine.  IMPRESSION: Stable exam. No acute process   Impression  1. Symptoms concerning for unstable angina. Initial cardiac markers normal and ECG with nonspecific T wave changes. Chest CTA negative for acute process as discussed above. Cardiac risk factors include gender, dyslipidemia, type 2 diabetes mellitus, and history of premature CAD in his mother.  2. Type  2 diabetes mellitus.  3. Hyperlipidemia.   Recommendations  Discussed with patient and family at bedside. Patient admitted to [facility] unit on IV NTG and IV Heparin. Cycle full set of cardiac markers. Continue ASA, statin, beta blocker, ACE-I., and diabetic regimen. Anticipate diagnostic heart catheterization in AM to define coronary anatomy and assess for revascularization options. Patient voices agreement to proceed.  Satira Sark, M.D., F.A.C.C.

## 2014-04-26 NOTE — Progress Notes (Signed)
[place] for Heparin Indication: chest pain/ACS  Allergies  Allergen Reactions  . Percocet [Oxycodone-Acetaminophen] Nausea And Vomiting    Makes patient feel not with it    Patient Measurements: Height: 5\' 11"  (180.3 cm) Weight: 256 lb 12.8 oz (116.484 kg) IBW/kg (Calculated) : 75.3 Heparin Dosing Weight: 100.78 kg  Vital Signs: Temp: 98.3 F (36.8 C) (08/11 2031) Temp src: Oral (08/11 2031) BP: 118/69 mmHg (08/11 2031) Pulse Rate: 79 (08/11 2031)  Labs:  Recent Labs  04/26/14 1130 04/26/14 1211 04/26/14 1450 04/26/14 2030  HGB 15.8  --   --   --   HCT 44.9  --   --   --   PLT 267  --   --   --   APTT  --   --   --  32  LABPROT  --   --   --  13.7  INR  --   --   --  1.05  HEPARINUNFRC  --   --  <0.10* <0.10*  CREATININE  --  0.76  --   --   TROPONINI  --  <0.30  --  <0.30    Estimated Creatinine Clearance: 141.8 ml/min (by C-G formula based on Cr of 0.76).  Assessment:  52 y.o. male with chest pain for heparin   Goal of Therapy:  Heparin level 0.3-0.7 units/ml Monitor platelets by anticoagulation protocol: Yes   Plan:  Heparin 2000 units IV bolus, then increase heparin 1800 units/hr Follow-up am labs.  Caryl Pina 04/26/2014,11:16 PM

## 2014-04-26 NOTE — Progress Notes (Signed)
ANTICOAGULATION CONSULT NOTE - Initial Consult  Pharmacy Consult for Heparin Indication: chest pain/ACS  Allergies  Allergen Reactions  . Percocet [Oxycodone-Acetaminophen] Nausea And Vomiting    Makes patient feel not with it    Patient Measurements: Height: 5\' 11"  (180.3 cm) Weight: 257 lb (116.574 kg) IBW/kg (Calculated) : 75.3 Heparin Dosing Weight: 100.78 kg  Vital Signs: Temp: 99.1 F (37.3 C) (08/11 1102) Temp src: Oral (08/11 1102) BP: 90/60 mmHg (08/11 1246) Pulse Rate: 94 (08/11 1246)  Labs:  Recent Labs  04/26/14 1130  HGB 15.8  HCT 44.9  PLT 267    Estimated Creatinine Clearance: 141.8 ml/min (by C-G formula based on Cr of 0.6).   Medical History: Past Medical History  Diagnosis Date  . Diabetes mellitus   . Hypercholesteremia   . Metabolic syndrome   . Gout   . Pancreatitis      Assessment: left chest pressure, SOB, cold sweats, and dizziness. 53 y/o M presented to [facility] from work with c/o ACS. Patient is high risk with cardiac risk factors that include premature FH of CAD, DM, HLD, and metabolic syndrome. CBC currently WNL. Other labs pending.   Goal of Therapy:  Heparin level 0.3-0.7 units/ml Monitor platelets by anticoagulation protocol: Yes   Plan:  Heparin 4000 unit IV bolus Heparin infusion at 1400 units/hr. Check heparin level in 6-8 hrs and daily. F/u other baseline labs.  Marilene Vath S. Alford Highland, PharmD, [facility] Clinical Staff Pharmacist Pager [phone]  Eilene Ghazi Stillinger 04/26/2014,1:43 PM

## 2014-04-26 NOTE — ED Notes (Signed)
Pt returned from CT °

## 2014-04-26 NOTE — ED Notes (Signed)
Pt pressure dropped after nitro tablet. Fluid bolus given at this time. EDP notified.

## 2014-04-27 ENCOUNTER — Encounter (HOSPITAL_COMMUNITY)
Admission: EM | Disposition: A | Discharge status: Home / Self Care | Payer: BC Managed Care – PPO | Source: Home / Self Care | Attending: Internal Medicine

## 2014-04-27 DIAGNOSIS — I369 Nonrheumatic tricuspid valve disorder, unspecified: Secondary | ICD-10-CM

## 2014-04-27 DIAGNOSIS — I2510 Atherosclerotic heart disease of native coronary artery without angina pectoris: Principal | ICD-10-CM

## 2014-04-27 HISTORY — PX: LEFT HEART CATHETERIZATION WITH CORONARY ANGIOGRAM: SHX5451

## 2014-04-27 HISTORY — PX: PERCUTANEOUS STENT INTERVENTION: SHX5500

## 2014-04-27 LAB — BASIC METABOLIC PANEL
Anion gap: 12 (ref 5–15)
BUN: 13 mg/dL (ref 6–23)
CO2: 24 mEq/L (ref 19–32)
Calcium: 8.2 mg/dL — ABNORMAL LOW (ref 8.4–10.5)
Chloride: 98 mEq/L (ref 96–112)
Creatinine, Ser: 0.66 mg/dL (ref 0.50–1.35)
GFR calc Af Amer: >90 mL/min (ref >90)
GFR calc non Af Amer: >90 mL/min (ref >90)
Glucose, Bld: 185 mg/dL — ABNORMAL HIGH (ref 70–99)
Potassium: 4.6 mEq/L (ref 3.7–5.3)
Sodium: 134 mEq/L — ABNORMAL LOW (ref 137–147)

## 2014-04-27 LAB — CBC
HCT: 40.3 % (ref 39.0–52.0)
Hemoglobin: 14 g/dL (ref 13.0–17.0)
MCH: 31.3 pg (ref 26.0–34.0)
MCHC: 34.7 g/dL (ref 30.0–36.0)
MCV: 90 fL (ref 78.0–100.0)
Platelets: 187 10*3/uL (ref 150–400)
RBC: 4.48 MIL/uL (ref 4.22–5.81)
RDW: 13.7 % (ref 11.5–15.5)
WBC: 5.7 10*3/uL (ref 4.0–10.5)

## 2014-04-27 LAB — HEPARIN LEVEL (UNFRACTIONATED): Heparin Unfractionated: <0.1 IU/mL — ABNORMAL LOW (ref 0.30–0.70)

## 2014-04-27 LAB — HEMOGLOBIN A1C
Hgb A1c MFr Bld: 9.6 % — ABNORMAL HIGH (ref <5.7)
Mean Plasma Glucose: 229 mg/dL — ABNORMAL HIGH (ref <117)

## 2014-04-27 LAB — GLUCOSE, CAPILLARY
Glucose-Capillary: 132 mg/dL — ABNORMAL HIGH (ref 70–99)
Glucose-Capillary: 138 mg/dL — ABNORMAL HIGH (ref 70–99)
Glucose-Capillary: 173 mg/dL — ABNORMAL HIGH (ref 70–99)
Glucose-Capillary: 180 mg/dL — ABNORMAL HIGH (ref 70–99)
Glucose-Capillary: 180 mg/dL — ABNORMAL HIGH (ref 70–99)
Glucose-Capillary: 198 mg/dL — ABNORMAL HIGH (ref 70–99)
Glucose-Capillary: 198 mg/dL — ABNORMAL HIGH (ref 70–99)

## 2014-04-27 LAB — LIPID PANEL
Cholesterol: 419 mg/dL — ABNORMAL HIGH (ref 0–200)
LDL Cholesterol: UNDETERMINED mg/dL (ref 0–99)
Triglycerides: 2489 mg/dL — ABNORMAL HIGH (ref <150)
VLDL: UNDETERMINED mg/dL (ref 0–40)

## 2014-04-27 LAB — POCT ACTIVATED CLOTTING TIME
Activated Clotting Time: 174 seconds
Activated Clotting Time: 366 seconds

## 2014-04-27 LAB — TROPONIN I
Troponin I: <0.3 ng/mL (ref <0.30)
Troponin I: <0.3 ng/mL (ref <0.30)

## 2014-04-27 LAB — T4, FREE: Free T4: 1.47 ng/dL (ref 0.80–1.80)

## 2014-04-27 SURGERY — LEFT HEART CATHETERIZATION WITH CORONARY ANGIOGRAM
Anesthesia: LOCAL

## 2014-04-27 MED ORDER — MORPHINE SULFATE 2 MG/ML IJ SOLN
2.0000 mg | INTRAMUSCULAR | Status: DC | PRN
  Administered 2014-04-27: 19:00:00 2 mg via INTRAVENOUS
  Filled 2014-04-27: qty 1

## 2014-04-27 MED ORDER — HEPARIN BOLUS VIA INFUSION
2000.0000 [IU] | Freq: Once | INTRAVENOUS | Status: AC
  Administered 2014-04-27: 2000 [IU] via INTRAVENOUS
  Filled 2014-04-27: qty 2000

## 2014-04-27 MED ORDER — HEPARIN (PORCINE) IN NACL 2-0.9 UNIT/ML-% IJ SOLN
INTRAMUSCULAR | Status: AC
  Filled 2014-04-27: qty 1000

## 2014-04-27 MED ORDER — INSULIN ASPART 100 UNIT/ML ~~LOC~~ SOLN: 0.0000 [IU] | Freq: Every day | SUBCUTANEOUS | Status: DC

## 2014-04-27 MED ORDER — VERAPAMIL HCL 2.5 MG/ML IV SOLN
INTRAVENOUS | Status: AC
  Filled 2014-04-27: qty 2

## 2014-04-27 MED ORDER — INSULIN DETEMIR 100 UNIT/ML ~~LOC~~ SOLN
15.0000 [IU] | Freq: Once | SUBCUTANEOUS | Status: AC
  Administered 2014-04-27: 15 [IU] via SUBCUTANEOUS
  Filled 2014-04-27: qty 0.15

## 2014-04-27 MED ORDER — BIVALIRUDIN 250 MG IV SOLR
INTRAVENOUS | Status: AC
  Filled 2014-04-27: qty 250

## 2014-04-27 MED ORDER — HEPARIN SODIUM (PORCINE) 1000 UNIT/ML IJ SOLN
INTRAMUSCULAR | Status: AC
  Filled 2014-04-27: qty 1

## 2014-04-27 MED ORDER — INSULIN REGULAR HUMAN (CONC) 500 UNIT/ML ~~LOC~~ SOLN
150.0000 [IU] | Freq: Two times a day (BID) | SUBCUTANEOUS | Status: DC
  Filled 2014-04-27: qty 20

## 2014-04-27 MED ORDER — TICAGRELOR 90 MG PO TABS
90.0000 mg | ORAL_TABLET | Freq: Two times a day (BID) | ORAL | Status: DC
  Administered 2014-04-28: 06:00:00 90 mg via ORAL
  Filled 2014-04-27 (×2): qty 1

## 2014-04-27 MED ORDER — INSULIN REGULAR HUMAN (CONC) 500 UNIT/ML ~~LOC~~ SOLN: 30.0000 [IU] | Freq: Two times a day (BID) | SUBCUTANEOUS | Status: DC

## 2014-04-27 MED ORDER — INSULIN ASPART 100 UNIT/ML ~~LOC~~ SOLN
0.0000 [IU] | Freq: Three times a day (TID) | SUBCUTANEOUS | Status: DC
  Administered 2014-04-28 (×2): 4 [IU] via SUBCUTANEOUS

## 2014-04-27 MED ORDER — SODIUM CHLORIDE 0.9 % IV SOLN: INTRAVENOUS | Status: AC

## 2014-04-27 MED ORDER — SODIUM CHLORIDE 0.9 % IV SOLN
0.2500 mg/kg/h | INTRAVENOUS | Status: DC
  Administered 2014-04-27: 0.25 mg/kg/h via INTRAVENOUS
  Filled 2014-04-27: qty 250

## 2014-04-27 MED ORDER — MIDAZOLAM HCL 2 MG/2ML IJ SOLN
INTRAMUSCULAR | Status: AC
  Filled 2014-04-27: qty 2

## 2014-04-27 MED ORDER — FENTANYL CITRATE 0.05 MG/ML IJ SOLN
INTRAMUSCULAR | Status: AC
  Filled 2014-04-27: qty 2

## 2014-04-27 MED ORDER — INSULIN ASPART 100 UNIT/ML ~~LOC~~ SOLN
0.0000 [IU] | SUBCUTANEOUS | Status: AC
  Administered 2014-04-27 (×2): 2 [IU] via SUBCUTANEOUS

## 2014-04-27 MED ORDER — LIVING WELL WITH DIABETES BOOK
Freq: Once | Status: AC
  Administered 2014-04-27: 22:00:00
  Filled 2014-04-27: qty 1

## 2014-04-27 MED ORDER — LIDOCAINE HCL (PF) 1 % IJ SOLN
INTRAMUSCULAR | Status: AC
  Filled 2014-04-27: qty 30

## 2014-04-27 MED ORDER — PERFLUTREN LIPID MICROSPHERE
1.0000 mL | INTRAVENOUS | Status: AC | PRN
  Administered 2014-04-27: 2 mL via INTRAVENOUS
  Filled 2014-04-27: qty 10

## 2014-04-27 MED ORDER — ADENOSINE 12 MG/4ML IV SOLN
16.0000 mL | Freq: Once | INTRAVENOUS | Status: DC
  Filled 2014-04-27 (×2): qty 16

## 2014-04-27 MED FILL — Nitroglycerin IV Soln 200 MCG/ML in D5W: INTRAVENOUS | Qty: 250 | Status: AC

## 2014-04-27 NOTE — Progress Notes (Signed)
Inpatient Diabetes Program Recommendations  AACE/ADA: New Consensus Statement on Inpatient Glycemic Control (2013)  Target Ranges:  Prepandial:   less than 140 mg/dL      Peak postprandial:   less than 180 mg/dL (1-2 hours)      Critically ill patients:  140 - 180 mg/dL   Results for Alec [name], Alec [name] (MRN [id number]) as of 04/27/2014 10:35  Ref. Range 04/26/2014 15:03 04/26/2014 20:30 04/27/2014 00:05 04/27/2014 04:56 04/27/2014 07:42  Glucose-Capillary Latest Range: 70-99 mg/dL 52 (L) 103 (H) 180 (H) 173 (H) 198 (H)   Reason for Visit: U500  Diabetes history: DM2 Outpatient Diabetes medications: U500 30 units BID and Jardiance 10 mg daily Current orders for Inpatient glycemic control: Novolog 0-20 units AC, Novolog 0-5 units HS, Novolog 0-9 units Q4H, Novolog 4 units TID with meals for meal coverage  Inpatient Diabetes Program Recommendations Insulin - Basal: Please consider ordering low dose basal insulin; recommend starting with Levemir 23 units daily starting now. Insulin-Correction: Please consider discontinuing Novolog correction scales currently ordered and order Novolog moderate correction Q4H.  Note: Consult received for U500 as an outpatient. Spoke with patient about diabetes and home regimen for diabetes control. Patient reports that he is followed by Dr. Forde Dandy for diabetes management and currently he takes Humulin R U500 30 units BID (QAM before breakfast and QHS) and Jardiance 10 mg daily as an outpatient for diabetes control. According to the patient he was just started on Jardiance in June and he use to use U500 in an insulin pump but his insurance does not cover the cost of insulin pump supplies so he has been doing insulin injections for the last 2 years. Patient reports that he uses a regular U100 insulin syringe and draws up to the 30 unit [place] on the regular syringe. Therefore, patient is actually taking 150 units of U500 BID (30 unit [name] x 5 = 150 units of U500).  Patient reports  that he has metabolic issues and very high lipids which make him insulin resistant.  Inquired about blood glucose control and patient states that his glucose ranges from 70's to 350's mg/dl and he reports that he has at least 2 hypoglycemic episodes per week. According to the patient he is to have heart cath today around 3:30pm so he will remain NPO until after the procedure. Explained current inpatient regimen for glycemic control and informed patient that since he is NPO we would recommend using Levemir for basal needs at this time. Patient is agreeable to using Levemir and Novolog while inpatient and states that he can get his family to bring his U500 insulin if MD wants to resume U500. Patient verbalized understanding of information discussed and he states that he has no further questions at this time related to diabetes.  Called Dr. Baldwin Crown office to inquire about prescribed medications for glycemic control. Dr. Forde Dandy and his CDE are not in the office today so another staff member (DJ) reviewed the patient's chart and provided information. Patient was last seen by Dr. Forde Dandy in June and was prescribed Humulin R U500 30 units TID and was started on Jardiance 10 mg daily at this visit.The staff member was not able to distinguish from the chart if the actual dose is 30 units or if it should be drawn up to the 30 unit [place] on a regular U100 insulin syringe.   According to the chart patient's A1C was 7.2% in June 2015 which is significantly lower than the current A1C of 9.6%.  Since the patient will be NPO for heart cath, recommend starting Levemir 23 units (based on 116 kg x 0.2 units) daily and Novolog moderate correction Q4H for inpatient glycemic control.     Thanks, Barnie Alderman, RN, MSN, CCRN Diabetes Coordinator Inpatient Diabetes Program [phone] (Team Pager) [phone] (AP office) [phone] [facility] office)

## 2014-04-27 NOTE — CV Procedure (Signed)
Left Heart Catheterization with Coronary Angiography and PCI Report  Alec [name]  53 y.o.  male 1961-06-19  Procedure Date: 04/27/2014 Referring Physician: Mali Hilty, M.D. Primary Cardiologist: Rozann Lesches, M.D. Primary Physician: Reynold Bowen, M.D.  INDICATIONS: Unstable angina pectoris  PROCEDURE: 1. Left heart catheterization; 2. Coronary angiography; 3. Left ventriculography; 4. FFR; 5. PCI RCA  CONSENT:  The risks, benefits, and details of the procedure were explained in detail to the patient. Risks including death, stroke, heart attack, kidney injury, allergy, limb ischemia, bleeding and radiation injury were discussed.  The patient verbalized understanding and wanted to proceed.  Informed written consent was obtained.  PROCEDURE TECHNIQUE:  After Xylocaine anesthesia a 5 French Slender sheath was placed in the right radial artery with an angiocath and the modified Seldinger technique.  Coronary angiography was done using a 5 F JR 4 and JL 3.5 catheter.  Left ventriculography was done using the JR 4 catheter and hand injection. 6000 units of heparin was administered.  A JR-4, 6 [place] guide catheter was used to obtain guiding shots. FFR was then performed. The wire was positioned distally. An FFR of 0.77 was obtained with hyperemia induced by contrast injection. Adenosine was not infused. The patient received a bolus followed by an infusion of bivalirudin prior to placing the wire in the coronary.  PCI was felt to be indicated and we proceeded using the Verrata wire as our rail for the procedure. We predilated with a 4.0 x 15 the Emerge balloon distally and in the more proximal lesion. We then positioned and deployed a 4.0 x 24 mm long Promus Premier in the distal stenosis at 16 atmospheres. We then direct stented the proximal stenosis with a 4.0 x 20 mm Promus Premier deployed at [employer]. Postdilatation was performed with a 5.0 x 15 [place] Emerge balloon. This was  performed distally as well as in the proximal stent to 13 atmospheres in both locations. The proximal stent using a 5.5 x 15 [place] Emerge to 13 atmospheres x2. The angiographic appearance was acceptable and the case was terminated.  Hemostasis was achieved with a wrist band.   CONTRAST:  Total of 255 cc.  COMPLICATIONS:  None   HEMODYNAMICS:  Aortic pressure 115/75 mmHg; LV pressure 116/20 mmHg; LVEDP 23 mmHg;  ANGIOGRAPHIC DATA:   The left main coronary artery is large caliber and widely patent.  The left anterior descending artery is widely patent and reaches the left ventricular apex. The LAD gives origin to a large branching first diagonal that supplies much of the lateral wall.  No significant obstructions are noted.  The left circumflex artery is widely patent giving origin to two small obtuse marginal.  The right coronary artery is dominant. The proximal vessel is greater than 5 mm in diameter and contains an eccentric 80% stenosis. The distal vessel contains 6 mental tandem stenoses of 80-95%. Large PDA and left ventricular branches arise distally.   PCI RESULTS: Proximal 80% stenosis is reduced to 0% with TIMI grade 3 flow and post dilatation to 5.5 mm in diameter using a Promus Premier drug-eluting stent. The distal stenosis is reduced from tandem 95% stenoses to 0% with TIMI grade 3 flow and post dilated to 5.0 mm in diameter.  LEFT VENTRICULOGRAM:  Left ventricular angiogram was done in the 30 RAO projection and revealed normal LV cavity size with EF of 60%.   IMPRESSIONS:  1. 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 Final postdilatation diameter is greater than 5 mm at each site. 2. Widely patent left coronary system 3. Normal left ventricular function   RECOMMENDATION:  Aspirin and Brilinta. Likely discharge in a.m. if no complications.

## 2014-04-27 NOTE — Progress Notes (Signed)
Utilization Review Completed.Donne Anon T8/08/2014

## 2014-04-27 NOTE — Progress Notes (Signed)
Echocardiogram 2D Echocardiogram with Definty has been performed.  Alec [name] 04/27/2014, 12:22 PM

## 2014-04-27 NOTE — H&P (View-Only) (Signed)
[facility]  DAILY PROGRESS NOTE  Subjective:  No events overnight. Looks comfortable in bed, but having 4/10 chest pain. Troponins were negative x 3 overnight. Cannot view today's EKG.  Objective:  Temp:  [97.5 F (36.4 C)-99.1 F (37.3 C)] 97.8 F (36.6 C) (08/12 0743) Pulse Rate:  [61-110] 68 (08/12 0743) Resp:  [13-22] 13 (08/12 0743) BP: (89-119)/(36-72) 96/69 mmHg (08/12 0743) SpO2:  [93 %-100 %] 93 % (08/12 0743) Weight:  [253 lb 12 oz (115.1 kg)-257 lb (116.574 kg)] 256 lb 6.4 oz (116.302 kg) (08/12 0500) Weight change:   Intake/Output from previous day: 08/11 0701 - 08/12 0700 In: 2116.4 [P.O.:440; I.V.:1676.4] Out: 1000 [Urine:1000]  Intake/Output from this shift:    Medications: Current Facility-Administered Medications  Medication Dose Route Frequency Provider Last Rate Last Dose  . 0.9 %  sodium chloride infusion   Intravenous Continuous Cecilie Kicks, NP 10 mL/hr at 04/26/14 2051    . 0.9 %  sodium chloride infusion  250 mL Intravenous PRN Cecilie Kicks, NP      . 0.9 %  sodium chloride infusion  1 mL/kg/hr Intravenous Continuous Cecilie Kicks, NP 115.1 mL/hr at 04/27/14 0848 1 mL/kg/hr at 04/27/14 0848  . acetaminophen (TYLENOL) tablet 650 mg  650 mg Oral Q4H PRN Cecilie Kicks, NP      . allopurinol (ZYLOPRIM) tablet 300 mg  300 mg Oral Daily Cecilie Kicks, NP      . ALPRAZolam Duanne Moron) tablet 0.25 mg  0.25 mg Oral BID PRN Cecilie Kicks, NP      . Derrill Memo ON 04/28/2014] aspirin EC tablet 81 mg  81 mg Oral Daily Cecilie Kicks, NP      . atorvastatin (LIPITOR) tablet 80 mg  80 mg Oral q1800 Cecilie Kicks, NP   80 mg at 04/26/14 2150  . Empagliflozin TABS 10 mg  10 mg Oral Daily Cecilie Kicks, NP      . escitalopram (LEXAPRO) tablet 20 mg  20 mg Oral Daily Cecilie Kicks, NP      . fenofibrate tablet 160 mg  160 mg Oral Daily Cecilie Kicks, NP      . heparin ADULT infusion 100 units/mL (25000 units/250 mL)  1,800 Units/hr Intravenous Continuous Satira Sark, MD 18 mL/hr  at 04/27/14 0500 1,800 Units/hr at 04/27/14 0500  . HYDROcodone-acetaminophen (NORCO) 10-325 MG per tablet 1 tablet  1 tablet Oral Q6H PRN Cecilie Kicks, NP   1 tablet at 04/27/14 0803  . Icosapent Ethyl CAPS 4 g  4 g Oral Daily Cecilie Kicks, NP      . insulin aspart (novoLOG) injection 0-20 Units  0-20 Units Subcutaneous TID WC Cecilie Kicks, NP      . insulin aspart (novoLOG) injection 0-5 Units  0-5 Units Subcutaneous QHS Cecilie Kicks, NP      . insulin aspart (novoLOG) injection 0-9 Units  0-9 Units Subcutaneous 6 times per day Cecilie Kicks, NP   2 Units at 04/27/14 0459  . insulin aspart (novoLOG) injection 4 Units  4 Units Subcutaneous TID WC Cecilie Kicks, NP      . loratadine (CLARITIN) tablet 10 mg  10 mg Oral Daily Cecilie Kicks, NP      . metoprolol tartrate (LOPRESSOR) tablet 25 mg  25 mg Oral BID Cecilie Kicks, NP   25 mg at 04/26/14 2150  . nitroGLYCERIN (NITROSTAT) SL tablet 0.4 mg  0.4 mg Sublingual Q5 Min x 3 PRN Cecilie Kicks, NP      . nitroGLYCERIN 50 mg in dextrose  5 % 250 mL (0.2 mg/mL) infusion  10 mcg/min Intravenous Titrated Cecilie Kicks, NP 4.5 mL/hr at 04/27/14 0849 15 mcg/min at 04/27/14 0849  . ondansetron (ZOFRAN) injection 4 mg  4 mg Intravenous Q6H PRN Cecilie Kicks, NP      . ramipril (ALTACE) capsule 10 mg  10 mg Oral Daily Cecilie Kicks, NP      . sodium chloride 0.9 % injection 3 mL  3 mL Intravenous Q12H Cecilie Kicks, NP   3 mL at 04/26/14 2150  . sodium chloride 0.9 % injection 3 mL  3 mL Intravenous PRN Cecilie Kicks, NP      . zolpidem (AMBIEN) tablet 5 mg  5 mg Oral QHS PRN,MR X 1 Cecilie Kicks, NP        Physical Exam: General appearance: alert and no distress Lungs: clear to auscultation bilaterally Heart: regular rate and rhythm, S1, S2 normal, no murmur, click, rub or gallop Extremities: extremities normal, atraumatic, no cyanosis or edema Pulses: 2+ and symmetric  Lab Results: Results for orders placed during the hospital encounter of 04/26/14 (from the  past 48 hour(s))  CBC WITH DIFFERENTIAL     Status: None   Collection Time    04/26/14 11:30 AM      Result Value Ref Range   WBC 7.8  4.0 - 10.5 K/uL   RBC 5.16  4.22 - 5.81 MIL/uL   Hemoglobin 15.8  13.0 - 17.0 g/dL   HCT 44.9  39.0 - 52.0 %   MCV 87.0  78.0 - 100.0 fL   MCH 30.6  26.0 - 34.0 pg   MCHC 35.2  30.0 - 36.0 g/dL   Comment: CORRECTED FOR LIPEMIA   RDW 13.8  11.5 - 15.5 %   Platelets 267  150 - 400 K/uL   Neutrophils Relative % 55  43 - 77 %   Neutro Abs 4.7  1.7 - 7.7 K/uL   Lymphocytes Relative 33  12 - 46 %   Lymphs Abs 2.9  0.7 - 4.0 K/uL   Monocytes Relative 9  3 - 12 %   Monocytes Absolute 0.8  0.1 - 1.0 K/uL   Eosinophils Relative 1  0 - 5 %   Eosinophils Absolute 0.1  0.0 - 0.7 K/uL   Basophils Relative 1  0 - 1 %   Basophils Absolute 0.1  0.0 - 0.1 K/uL  D-DIMER, QUANTITATIVE     Status: Abnormal   Collection Time    04/26/14 11:30 AM      Result Value Ref Range   D-Dimer, Quant 0.90 (*) 0.00 - 0.48 ug/mL-FEU   Comment:            AT THE INHOUSE ESTABLISHED CUTOFF     VALUE OF 0.48 ug/mL FEU,     THIS ASSAY HAS BEEN DOCUMENTED     IN THE LITERATURE TO HAVE     A SENSITIVITY AND NEGATIVE     PREDICTIVE VALUE OF AT LEAST     98 TO 99%.  THE TEST RESULT     SHOULD BE CORRELATED WITH     AN ASSESSMENT OF THE CLINICAL     PROBABILITY OF DVT / VTE.     LIPEMIC SPECIMEN     Performed at [place]     Status: None   Collection Time    04/26/14 12:11 PM      Result Value Ref Range   Pro B Natriuretic peptide (BNP) 16.1  0 - 125 pg/mL   Comment: POST-ULTRACENTRIFUGATION     HEMOLYZED SPECIMEN, RESULTS MAY BE AFFECTED     Performed at [place] METABOLIC PANEL     Status: Abnormal   Collection Time    04/26/14 12:11 PM      Result Value Ref Range   Sodium 134 (*) 137 - 147 mEq/L   Comment: POST-ULTRACENTRIFUGATION   Potassium 4.7  3.7 - 5.3 mEq/L   Comment: HEMOLYZED SPECIMEN, RESULTS  MAY BE AFFECTED     POST-ULTRACENTRIFUGATION   Chloride 98  96 - 112 mEq/L   Comment: POST-ULTRACENTRIFUGATION   CO2 16 (*) 19 - 32 mEq/L   Comment: POST-ULTRACENTRIFUGATION   Glucose, Bld 214 (*) 70 - 99 mg/dL   Comment: POST-ULTRACENTRIFUGATION   BUN 23  6 - 23 mg/dL   Comment: POST-ULTRACENTRIFUGATION   Creatinine, Ser 0.76  0.50 - 1.35 mg/dL   Comment: POST-ULTRACENTRIFUGATION   Calcium 9.2  8.4 - 10.5 mg/dL   Comment: POST-ULTRACENTRIFUGATION   Total Protein 7.7  6.0 - 8.3 g/dL   Comment: POST-ULTRACENTRIFUGATION   Albumin 3.2 (*) 3.5 - 5.2 g/dL   Comment: POST-ULTRACENTRIFUGATION   AST 56 (*) 0 - 37 U/L   Comment: HEMOLYSIS AT THIS LEVEL MAY AFFECT RESULT     POST-ULTRACENTRIFUGATION   ALT 29  0 - 53 U/L   Comment: HEMOLYSIS AT THIS LEVEL MAY AFFECT RESULT     POST-ULTRACENTRIFUGATION   Alkaline Phosphatase 50  39 - 117 U/L   Comment: HEMOLYSIS AT THIS LEVEL MAY AFFECT RESULT     POST-ULTRACENTRIFUGATION   Total Bilirubin 0.2 (*) 0.3 - 1.2 mg/dL   Comment: POST-ULTRACENTRIFUGATION   GFR calc non Af Amer >90  >90 mL/min   GFR calc Af Amer >90  >90 mL/min   Comment: (NOTE)     The eGFR has been calculated using the CKD EPI equation.     This calculation has not been validated in all clinical situations.     eGFR's persistently <90 mL/min signify possible Chronic Kidney     Disease.   Anion gap 20 (*) 5 - 15   Comment: Performed at [facility]  TROPONIN I     Status: None   Collection Time    04/26/14 12:11 PM      Result Value Ref Range   Troponin I <0.30  <0.30 ng/mL   Comment:            Due to the release kinetics of cTnI,     a negative result within the first hours     of the onset of symptoms does not rule out     myocardial infarction with certainty.     If myocardial infarction is still suspected,     repeat the test at appropriate intervals.     POST-ULTRACENTRIFUGATION     HEMOLYZED SPECIMEN, RESULTS MAY BE AFFECTED     Performed at [place] (UNFRACTIONATED)     Status: Abnormal   Collection Time    04/26/14  2:50 PM      Result Value Ref Range   Heparin Unfractionated <0.10 (*) 0.30 - 0.70 IU/mL   Comment: CORRECTED FOR LIPEMIA     Performed at [facility]  CBG MONITORING, ED     Status: Abnormal   Collection Time    04/26/14  3:03 PM      Result Value Ref Range   Glucose-Capillary 52 (*) 70 -  99 mg/dL  MRSA PCR SCREENING     Status: None   Collection Time    04/26/14  7:02 PM      Result Value Ref Range   MRSA by PCR NEGATIVE  NEGATIVE   Comment:            The GeneXpert MRSA Assay (FDA     approved for NASAL specimens     only), is one component of a     comprehensive MRSA colonization     surveillance program. It is not     intended to diagnose MRSA     infection nor to guide or     monitor treatment for     MRSA infections.  HEPARIN LEVEL (UNFRACTIONATED)     Status: Abnormal   Collection Time    04/26/14  8:30 PM      Result Value Ref Range   Heparin Unfractionated <0.10 (*) 0.30 - 0.70 IU/mL   Comment:            IF HEPARIN RESULTS ARE BELOW     EXPECTED VALUES, AND PATIENT     DOSAGE HAS BEEN CONFIRMED,     SUGGEST FOLLOW UP TESTING     OF ANTITHROMBIN III LEVELS.     POST-ULTRACENTRIFUGATION  MAGNESIUM     Status: None   Collection Time    04/26/14  8:30 PM      Result Value Ref Range   Magnesium 1.8  1.5 - 2.5 mg/dL  TSH     Status: None   Collection Time    04/26/14  8:30 PM      Result Value Ref Range   TSH 1.590  0.350 - 4.500 uIU/mL  TROPONIN I     Status: None   Collection Time    04/26/14  8:30 PM      Result Value Ref Range   Troponin I <0.30  <0.30 ng/mL   Comment:            Due to the release kinetics of cTnI,     a negative result within the first hours     of the onset of symptoms does not rule out     myocardial infarction with certainty.     If myocardial infarction is still suspected,     repeat the test at appropriate intervals.   PROTIME-INR     Status: None   Collection Time    04/26/14  8:30 PM      Result Value Ref Range   Prothrombin Time 13.7  11.6 - 15.2 seconds   INR 1.05  0.00 - 1.49  APTT     Status: None   Collection Time    04/26/14  8:30 PM      Result Value Ref Range   aPTT 32  24 - 37 seconds  GLUCOSE, CAPILLARY     Status: Abnormal   Collection Time    04/26/14  8:30 PM      Result Value Ref Range   Glucose-Capillary 103 (*) 70 - 99 mg/dL   Comment 1 Notify RN    GLUCOSE, CAPILLARY     Status: Abnormal   Collection Time    04/27/14 12:05 AM      Result Value Ref Range   Glucose-Capillary 180 (*) 70 - 99 mg/dL  TROPONIN I     Status: None   Collection Time    04/27/14  1:18 AM      Result Value Ref Range  Troponin I <0.30  <0.30 ng/mL   Comment:            Due to the release kinetics of cTnI,     a negative result within the first hours     of the onset of symptoms does not rule out     myocardial infarction with certainty.     If myocardial infarction is still suspected,     repeat the test at appropriate intervals.  GLUCOSE, CAPILLARY     Status: Abnormal   Collection Time    04/27/14  4:56 AM      Result Value Ref Range   Glucose-Capillary 173 (*) 70 - 99 mg/dL   Comment 1 Notify RN    GLUCOSE, CAPILLARY     Status: Abnormal   Collection Time    04/27/14  7:42 AM      Result Value Ref Range   Glucose-Capillary 198 (*) 70 - 99 mg/dL   Comment 1 Notify RN      Imaging: Dg Chest 2 View  04/26/2014   CLINICAL DATA:  Chest pain  EXAM: CHEST  2 VIEW  COMPARISON:  07/21/2009  FINDINGS: Normal heart size and vascularity. No focal pneumonia, collapse or consolidation. Negative for edema, effusion or pneumothorax. Trachea midline. Degenerative changes of the spine.  IMPRESSION: Stable exam.  No acute process   Electronically Signed   By: Daryll Brod M.D.   On: 04/26/2014 11:31   Ct Angio Chest Pe W/cm &/or Wo Cm  04/26/2014   CLINICAL DATA:  Elevated D-dimer.  EXAM: CT  ANGIOGRAPHY CHEST WITH CONTRAST  TECHNIQUE: Multidetector CT imaging of the chest was performed using the standard protocol during bolus administration of intravenous contrast. Multiplanar CT image reconstructions and MIPs were obtained to evaluate the vascular anatomy.  CONTRAST:  100 mL OMNIPAQUE IOHEXOL 350 MG/ML SOLN  COMPARISON:  PA and lateral chest earlier this same day and 07/21/2009.  FINDINGS: No pulmonary embolus is identified. Heart size is normal. No pleural or pericardial effusion. There is no axillary, hilar or mediastinal lymphadenopathy. Minimal aortic atherosclerosis is noted. No definite coronary artery calcifications are seen. The lungs are clear. Visualized upper abdomen demonstrates fatty infiltration of the liver. Thoracic spondylosis is noted. No lytic or sclerotic bony lesion is seen.  Review of the MIP images confirms the above findings.  IMPRESSION: Negative for pulmonary embolus or acute disease.  Fatty infiltration of the liver.   Electronically Signed   By: Inge Rise M.D.   On: 04/26/2014 15:34    Assessment:  1. Active Problems: 2.   Unstable angina 3.   Type 2 diabetes mellitus 4.   Mixed hyperlipidemia 5.   Plan:  1. Chest pain with typical and atypical features. Concern for angina initially and he is scheduled for LHC this afternoon. Troponins have been negative overnight, but he still reports 4/10 chest pain. He is on nitroglycerin and heparin.  Time Spent Directly with Patient:  15 minutes  Length of Stay:  LOS: 1 day   Pixie Casino, MD, [facility] Attending Cardiologist [facility]  Maguire Sime C 04/27/2014, 8:55 AM

## 2014-04-27 NOTE — Interval H&P Note (Signed)
History and Physical Interval Note:  04/27/2014 3:54 PM Cath Lab Visit (complete for each Cath Lab visit)  Clinical Evaluation Leading to the Procedure:   ACS: Yes.    Non-ACS:    Anginal Classification: CCS IV  Anti-ischemic medical therapy: Minimal Therapy (1 class of medications)  Non-Invasive Test Results: No non-invasive testing performed  Prior CABG: No previous CABG       Alec [name]  has presented today for surgery, with the diagnosis of cp  The various methods of treatment have been discussed with the patient and family. After consideration of risks, benefits and other options for treatment, the patient has consented to  Procedure(s): LEFT HEART CATHETERIZATION WITH CORONARY ANGIOGRAM (N/A) as a surgical intervention .  The patient's history has been reviewed, patient examined, no change in status, stable for surgery.  I have reviewed the patient's chart and labs.  Questions were answered to the patient's satisfaction.     Alec [name]

## 2014-04-27 NOTE — Progress Notes (Signed)
[place] for Heparin Indication: chest pain/ACS  Allergies  Allergen Reactions  . Percocet [Oxycodone-Acetaminophen] Nausea And Vomiting    Makes patient feel not with it    Patient Measurements: Height: 5\' 11"  (180.3 cm) Weight: 256 lb 6.4 oz (116.302 kg) IBW/kg (Calculated) : 75.3 Heparin Dosing Weight: 100.78 kg  Vital Signs: Temp: 97.8 F (36.6 C) (08/12 0743) Temp src: Oral (08/12 0743) BP: 96/69 mmHg (08/12 0743) Pulse Rate: 68 (08/12 0743)  Labs:  Recent Labs  04/26/14 1130  04/26/14 1211 04/26/14 1450 04/26/14 2030 04/27/14 0118 04/27/14 0800 04/27/14 0850  HGB 15.8  --   --   --   --   --   --  14.0  HCT 44.9  --   --   --   --   --   --  40.3  PLT 267  --   --   --   --   --   --  187  APTT  --   --   --   --  32  --   --   --   LABPROT  --   --   --   --  13.7  --   --   --   INR  --   --   --   --  1.05  --   --   --   HEPARINUNFRC  --   --   --  <0.10* <0.10*  --  <0.10*  --   CREATININE  --   --  0.76  --   --   --   --  0.66  TROPONINI  --   < > <0.30  --  <0.30 <0.30  --  <0.30  < > = values in this interval not displayed.  Estimated Creatinine Clearance: 141.7 ml/min (by C-G formula based on Cr of 0.66).  Past Medical History  Diagnosis Date  . Mixed hyperlipidemia   . Metabolic syndrome   . Gout   . Pancreatitis   . Type 2 diabetes mellitus   . Arthritis     "back" (04/26/2014)  . Chronic lower back pain   . Gout   . Depression      Assessment:  53 y.o. male admitted 04/26/2014  with chest pain.  Pharmacy consulted to dose  heparin .  Coag: ACS, on heparin at 1800 units/hr, heparin levels have been slow to result due to highly lipemic samples requiring centrifuging in lab.  Heparin level remains low despite rate increases.  Patient denies bleeding.  Heparin infusing without issue.  CV:  ACS, hyperlipidemia  ASA, atorva, tricor, metoprolol, ramipril, Icoasapent  Endo: DM, Gout SSI,  empagliflozin, allopurinol,   Goal of Therapy:  Heparin level 0.3-0.7 units/ml Monitor platelets by anticoagulation protocol: Yes   Plan:  Heparin 2000 units IV bolus, then increase heparin to 2100 units/hr Follow-up after cath  Thank you for allowing pharmacy to be a part of this patients care team.  Rowe Robert Pharm.D., BCPS, AQ-Cardiology Clinical Pharmacist 04/27/2014 10:48 AM Pager: [phone] Phone: [phone]

## 2014-04-27 NOTE — Progress Notes (Signed)
[facility]  DAILY PROGRESS NOTE  Subjective:  No events overnight. Looks comfortable in bed, but having 4/10 chest pain. Troponins were negative x 3 overnight. Cannot view today's EKG.  Objective:  Temp:  [97.5 F (36.4 C)-99.1 F (37.3 C)] 97.8 F (36.6 C) (08/12 0743) Pulse Rate:  [61-110] 68 (08/12 0743) Resp:  [13-22] 13 (08/12 0743) BP: (89-119)/(36-72) 96/69 mmHg (08/12 0743) SpO2:  [93 %-100 %] 93 % (08/12 0743) Weight:  [253 lb 12 oz (115.1 kg)-257 lb (116.574 kg)] 256 lb 6.4 oz (116.302 kg) (08/12 0500) Weight change:   Intake/Output from previous day: 08/11 0701 - 08/12 0700 In: 2116.4 [P.O.:440; I.V.:1676.4] Out: 1000 [Urine:1000]  Intake/Output from this shift:    Medications: Current Facility-Administered Medications  Medication Dose Route Frequency Provider Last Rate Last Dose  . 0.9 %  sodium chloride infusion   Intravenous Continuous Cecilie Kicks, NP 10 mL/hr at 04/26/14 2051    . 0.9 %  sodium chloride infusion  250 mL Intravenous PRN Cecilie Kicks, NP      . 0.9 %  sodium chloride infusion  1 mL/kg/hr Intravenous Continuous Cecilie Kicks, NP 115.1 mL/hr at 04/27/14 0848 1 mL/kg/hr at 04/27/14 0848  . acetaminophen (TYLENOL) tablet 650 mg  650 mg Oral Q4H PRN Cecilie Kicks, NP      . allopurinol (ZYLOPRIM) tablet 300 mg  300 mg Oral Daily Cecilie Kicks, NP      . ALPRAZolam Duanne Moron) tablet 0.25 mg  0.25 mg Oral BID PRN Cecilie Kicks, NP      . Derrill Memo ON 04/28/2014] aspirin EC tablet 81 mg  81 mg Oral Daily Cecilie Kicks, NP      . atorvastatin (LIPITOR) tablet 80 mg  80 mg Oral q1800 Cecilie Kicks, NP   80 mg at 04/26/14 2150  . Empagliflozin TABS 10 mg  10 mg Oral Daily Cecilie Kicks, NP      . escitalopram (LEXAPRO) tablet 20 mg  20 mg Oral Daily Cecilie Kicks, NP      . fenofibrate tablet 160 mg  160 mg Oral Daily Cecilie Kicks, NP      . heparin ADULT infusion 100 units/mL (25000 units/250 mL)  1,800 Units/hr Intravenous Continuous Satira Sark, MD 18 mL/hr  at 04/27/14 0500 1,800 Units/hr at 04/27/14 0500  . HYDROcodone-acetaminophen (NORCO) 10-325 MG per tablet 1 tablet  1 tablet Oral Q6H PRN Cecilie Kicks, NP   1 tablet at 04/27/14 0803  . Icosapent Ethyl CAPS 4 g  4 g Oral Daily Cecilie Kicks, NP      . insulin aspart (novoLOG) injection 0-20 Units  0-20 Units Subcutaneous TID WC Cecilie Kicks, NP      . insulin aspart (novoLOG) injection 0-5 Units  0-5 Units Subcutaneous QHS Cecilie Kicks, NP      . insulin aspart (novoLOG) injection 0-9 Units  0-9 Units Subcutaneous 6 times per day Cecilie Kicks, NP   2 Units at 04/27/14 0459  . insulin aspart (novoLOG) injection 4 Units  4 Units Subcutaneous TID WC Cecilie Kicks, NP      . loratadine (CLARITIN) tablet 10 mg  10 mg Oral Daily Cecilie Kicks, NP      . metoprolol tartrate (LOPRESSOR) tablet 25 mg  25 mg Oral BID Cecilie Kicks, NP   25 mg at 04/26/14 2150  . nitroGLYCERIN (NITROSTAT) SL tablet 0.4 mg  0.4 mg Sublingual Q5 Min x 3 PRN Cecilie Kicks, NP      . nitroGLYCERIN 50 mg in dextrose  5 % 250 mL (0.2 mg/mL) infusion  10 mcg/min Intravenous Titrated Cecilie Kicks, NP 4.5 mL/hr at 04/27/14 0849 15 mcg/min at 04/27/14 0849  . ondansetron (ZOFRAN) injection 4 mg  4 mg Intravenous Q6H PRN Cecilie Kicks, NP      . ramipril (ALTACE) capsule 10 mg  10 mg Oral Daily Cecilie Kicks, NP      . sodium chloride 0.9 % injection 3 mL  3 mL Intravenous Q12H Cecilie Kicks, NP   3 mL at 04/26/14 2150  . sodium chloride 0.9 % injection 3 mL  3 mL Intravenous PRN Cecilie Kicks, NP      . zolpidem (AMBIEN) tablet 5 mg  5 mg Oral QHS PRN,MR X 1 Cecilie Kicks, NP        Physical Exam: General appearance: alert and no distress Lungs: clear to auscultation bilaterally Heart: regular rate and rhythm, S1, S2 normal, no murmur, click, rub or gallop Extremities: extremities normal, atraumatic, no cyanosis or edema Pulses: 2+ and symmetric  Lab Results: Results for orders placed during the hospital encounter of 04/26/14 (from the  past 48 hour(s))  CBC WITH DIFFERENTIAL     Status: None   Collection Time    04/26/14 11:30 AM      Result Value Ref Range   WBC 7.8  4.0 - 10.5 K/uL   RBC 5.16  4.22 - 5.81 MIL/uL   Hemoglobin 15.8  13.0 - 17.0 g/dL   HCT 44.9  39.0 - 52.0 %   MCV 87.0  78.0 - 100.0 fL   MCH 30.6  26.0 - 34.0 pg   MCHC 35.2  30.0 - 36.0 g/dL   Comment: CORRECTED FOR LIPEMIA   RDW 13.8  11.5 - 15.5 %   Platelets 267  150 - 400 K/uL   Neutrophils Relative % 55  43 - 77 %   Neutro Abs 4.7  1.7 - 7.7 K/uL   Lymphocytes Relative 33  12 - 46 %   Lymphs Abs 2.9  0.7 - 4.0 K/uL   Monocytes Relative 9  3 - 12 %   Monocytes Absolute 0.8  0.1 - 1.0 K/uL   Eosinophils Relative 1  0 - 5 %   Eosinophils Absolute 0.1  0.0 - 0.7 K/uL   Basophils Relative 1  0 - 1 %   Basophils Absolute 0.1  0.0 - 0.1 K/uL  D-DIMER, QUANTITATIVE     Status: Abnormal   Collection Time    04/26/14 11:30 AM      Result Value Ref Range   D-Dimer, Quant 0.90 (*) 0.00 - 0.48 ug/mL-FEU   Comment:            AT THE INHOUSE ESTABLISHED CUTOFF     VALUE OF 0.48 ug/mL FEU,     THIS ASSAY HAS BEEN DOCUMENTED     IN THE LITERATURE TO HAVE     A SENSITIVITY AND NEGATIVE     PREDICTIVE VALUE OF AT LEAST     98 TO 99%.  THE TEST RESULT     SHOULD BE CORRELATED WITH     AN ASSESSMENT OF THE CLINICAL     PROBABILITY OF DVT / VTE.     LIPEMIC SPECIMEN     Performed at [place]     Status: None   Collection Time    04/26/14 12:11 PM      Result Value Ref Range   Pro B Natriuretic peptide (BNP) 16.1  0 - 125 pg/mL   Comment: POST-ULTRACENTRIFUGATION     HEMOLYZED SPECIMEN, RESULTS MAY BE AFFECTED     Performed at [place] METABOLIC PANEL     Status: Abnormal   Collection Time    04/26/14 12:11 PM      Result Value Ref Range   Sodium 134 (*) 137 - 147 mEq/L   Comment: POST-ULTRACENTRIFUGATION   Potassium 4.7  3.7 - 5.3 mEq/L   Comment: HEMOLYZED SPECIMEN, RESULTS  MAY BE AFFECTED     POST-ULTRACENTRIFUGATION   Chloride 98  96 - 112 mEq/L   Comment: POST-ULTRACENTRIFUGATION   CO2 16 (*) 19 - 32 mEq/L   Comment: POST-ULTRACENTRIFUGATION   Glucose, Bld 214 (*) 70 - 99 mg/dL   Comment: POST-ULTRACENTRIFUGATION   BUN 23  6 - 23 mg/dL   Comment: POST-ULTRACENTRIFUGATION   Creatinine, Ser 0.76  0.50 - 1.35 mg/dL   Comment: POST-ULTRACENTRIFUGATION   Calcium 9.2  8.4 - 10.5 mg/dL   Comment: POST-ULTRACENTRIFUGATION   Total Protein 7.7  6.0 - 8.3 g/dL   Comment: POST-ULTRACENTRIFUGATION   Albumin 3.2 (*) 3.5 - 5.2 g/dL   Comment: POST-ULTRACENTRIFUGATION   AST 56 (*) 0 - 37 U/L   Comment: HEMOLYSIS AT THIS LEVEL MAY AFFECT RESULT     POST-ULTRACENTRIFUGATION   ALT 29  0 - 53 U/L   Comment: HEMOLYSIS AT THIS LEVEL MAY AFFECT RESULT     POST-ULTRACENTRIFUGATION   Alkaline Phosphatase 50  39 - 117 U/L   Comment: HEMOLYSIS AT THIS LEVEL MAY AFFECT RESULT     POST-ULTRACENTRIFUGATION   Total Bilirubin 0.2 (*) 0.3 - 1.2 mg/dL   Comment: POST-ULTRACENTRIFUGATION   GFR calc non Af Amer >90  >90 mL/min   GFR calc Af Amer >90  >90 mL/min   Comment: (NOTE)     The eGFR has been calculated using the CKD EPI equation.     This calculation has not been validated in all clinical situations.     eGFR's persistently <90 mL/min signify possible Chronic Kidney     Disease.   Anion gap 20 (*) 5 - 15   Comment: Performed at [facility]  TROPONIN I     Status: None   Collection Time    04/26/14 12:11 PM      Result Value Ref Range   Troponin I <0.30  <0.30 ng/mL   Comment:            Due to the release kinetics of cTnI,     a negative result within the first hours     of the onset of symptoms does not rule out     myocardial infarction with certainty.     If myocardial infarction is still suspected,     repeat the test at appropriate intervals.     POST-ULTRACENTRIFUGATION     HEMOLYZED SPECIMEN, RESULTS MAY BE AFFECTED     Performed at [place] (UNFRACTIONATED)     Status: Abnormal   Collection Time    04/26/14  2:50 PM      Result Value Ref Range   Heparin Unfractionated <0.10 (*) 0.30 - 0.70 IU/mL   Comment: CORRECTED FOR LIPEMIA     Performed at [facility]  CBG MONITORING, ED     Status: Abnormal   Collection Time    04/26/14  3:03 PM      Result Value Ref Range   Glucose-Capillary 52 (*) 70 -  99 mg/dL  MRSA PCR SCREENING     Status: None   Collection Time    04/26/14  7:02 PM      Result Value Ref Range   MRSA by PCR NEGATIVE  NEGATIVE   Comment:            The GeneXpert MRSA Assay (FDA     approved for NASAL specimens     only), is one component of a     comprehensive MRSA colonization     surveillance program. It is not     intended to diagnose MRSA     infection nor to guide or     monitor treatment for     MRSA infections.  HEPARIN LEVEL (UNFRACTIONATED)     Status: Abnormal   Collection Time    04/26/14  8:30 PM      Result Value Ref Range   Heparin Unfractionated <0.10 (*) 0.30 - 0.70 IU/mL   Comment:            IF HEPARIN RESULTS ARE BELOW     EXPECTED VALUES, AND PATIENT     DOSAGE HAS BEEN CONFIRMED,     SUGGEST FOLLOW UP TESTING     OF ANTITHROMBIN III LEVELS.     POST-ULTRACENTRIFUGATION  MAGNESIUM     Status: None   Collection Time    04/26/14  8:30 PM      Result Value Ref Range   Magnesium 1.8  1.5 - 2.5 mg/dL  TSH     Status: None   Collection Time    04/26/14  8:30 PM      Result Value Ref Range   TSH 1.590  0.350 - 4.500 uIU/mL  TROPONIN I     Status: None   Collection Time    04/26/14  8:30 PM      Result Value Ref Range   Troponin I <0.30  <0.30 ng/mL   Comment:            Due to the release kinetics of cTnI,     a negative result within the first hours     of the onset of symptoms does not rule out     myocardial infarction with certainty.     If myocardial infarction is still suspected,     repeat the test at appropriate intervals.   PROTIME-INR     Status: None   Collection Time    04/26/14  8:30 PM      Result Value Ref Range   Prothrombin Time 13.7  11.6 - 15.2 seconds   INR 1.05  0.00 - 1.49  APTT     Status: None   Collection Time    04/26/14  8:30 PM      Result Value Ref Range   aPTT 32  24 - 37 seconds  GLUCOSE, CAPILLARY     Status: Abnormal   Collection Time    04/26/14  8:30 PM      Result Value Ref Range   Glucose-Capillary 103 (*) 70 - 99 mg/dL   Comment 1 Notify RN    GLUCOSE, CAPILLARY     Status: Abnormal   Collection Time    04/27/14 12:05 AM      Result Value Ref Range   Glucose-Capillary 180 (*) 70 - 99 mg/dL  TROPONIN I     Status: None   Collection Time    04/27/14  1:18 AM      Result Value Ref Range  Troponin I <0.30  <0.30 ng/mL   Comment:            Due to the release kinetics of cTnI,     a negative result within the first hours     of the onset of symptoms does not rule out     myocardial infarction with certainty.     If myocardial infarction is still suspected,     repeat the test at appropriate intervals.  GLUCOSE, CAPILLARY     Status: Abnormal   Collection Time    04/27/14  4:56 AM      Result Value Ref Range   Glucose-Capillary 173 (*) 70 - 99 mg/dL   Comment 1 Notify RN    GLUCOSE, CAPILLARY     Status: Abnormal   Collection Time    04/27/14  7:42 AM      Result Value Ref Range   Glucose-Capillary 198 (*) 70 - 99 mg/dL   Comment 1 Notify RN      Imaging: Dg Chest 2 View  04/26/2014   CLINICAL DATA:  Chest pain  EXAM: CHEST  2 VIEW  COMPARISON:  07/21/2009  FINDINGS: Normal heart size and vascularity. No focal pneumonia, collapse or consolidation. Negative for edema, effusion or pneumothorax. Trachea midline. Degenerative changes of the spine.  IMPRESSION: Stable exam.  No acute process   Electronically Signed   By: Daryll Brod M.D.   On: 04/26/2014 11:31   Ct Angio Chest Pe W/cm &/or Wo Cm  04/26/2014   CLINICAL DATA:  Elevated D-dimer.  EXAM: CT  ANGIOGRAPHY CHEST WITH CONTRAST  TECHNIQUE: Multidetector CT imaging of the chest was performed using the standard protocol during bolus administration of intravenous contrast. Multiplanar CT image reconstructions and MIPs were obtained to evaluate the vascular anatomy.  CONTRAST:  100 mL OMNIPAQUE IOHEXOL 350 MG/ML SOLN  COMPARISON:  PA and lateral chest earlier this same day and 07/21/2009.  FINDINGS: No pulmonary embolus is identified. Heart size is normal. No pleural or pericardial effusion. There is no axillary, hilar or mediastinal lymphadenopathy. Minimal aortic atherosclerosis is noted. No definite coronary artery calcifications are seen. The lungs are clear. Visualized upper abdomen demonstrates fatty infiltration of the liver. Thoracic spondylosis is noted. No lytic or sclerotic bony lesion is seen.  Review of the MIP images confirms the above findings.  IMPRESSION: Negative for pulmonary embolus or acute disease.  Fatty infiltration of the liver.   Electronically Signed   By: Inge Rise M.D.   On: 04/26/2014 15:34    Assessment:  1. Active Problems: 2.   Unstable angina 3.   Type 2 diabetes mellitus 4.   Mixed hyperlipidemia 5.   Plan:  1. Chest pain with typical and atypical features. Concern for angina initially and he is scheduled for LHC this afternoon. Troponins have been negative overnight, but he still reports 4/10 chest pain. He is on nitroglycerin and heparin.  Time Spent Directly with Patient:  15 minutes  Length of Stay:  LOS: 1 day   Pixie Casino, MD, [facility] Attending Cardiologist [facility]  HILTY,Kenneth C 04/27/2014, 8:55 AM

## 2014-04-28 ENCOUNTER — Encounter (HOSPITAL_COMMUNITY): Payer: Self-pay | Admitting: Physician Assistant

## 2014-04-28 DIAGNOSIS — E781 Pure hyperglyceridemia: Secondary | ICD-10-CM

## 2014-04-28 DIAGNOSIS — E8881 Metabolic syndrome: Secondary | ICD-10-CM | POA: Diagnosis present

## 2014-04-28 DIAGNOSIS — F32A Depression, unspecified: Secondary | ICD-10-CM | POA: Diagnosis present

## 2014-04-28 DIAGNOSIS — Z9289 Personal history of other medical treatment: Secondary | ICD-10-CM

## 2014-04-28 DIAGNOSIS — F329 Major depressive disorder, single episode, unspecified: Secondary | ICD-10-CM | POA: Diagnosis present

## 2014-04-28 DIAGNOSIS — I2510 Atherosclerotic heart disease of native coronary artery without angina pectoris: Secondary | ICD-10-CM | POA: Diagnosis present

## 2014-04-28 DIAGNOSIS — M109 Gout, unspecified: Secondary | ICD-10-CM | POA: Diagnosis present

## 2014-04-28 LAB — BASIC METABOLIC PANEL
Anion gap: 11 (ref 5–15)
BUN: 11 mg/dL (ref 6–23)
CO2: 25 mEq/L (ref 19–32)
Calcium: 8.6 mg/dL (ref 8.4–10.5)
Chloride: 98 mEq/L (ref 96–112)
Creatinine, Ser: 0.73 mg/dL (ref 0.50–1.35)
GFR calc Af Amer: >90 mL/min (ref >90)
GFR calc non Af Amer: >90 mL/min (ref >90)
Glucose, Bld: 153 mg/dL — ABNORMAL HIGH (ref 70–99)
Potassium: 4.2 mEq/L (ref 3.7–5.3)
Sodium: 134 mEq/L — ABNORMAL LOW (ref 137–147)

## 2014-04-28 LAB — CBC
HCT: 38.4 % — ABNORMAL LOW (ref 39.0–52.0)
Hemoglobin: 13.1 g/dL (ref 13.0–17.0)
MCH: 30.4 pg (ref 26.0–34.0)
MCHC: 34.1 g/dL (ref 30.0–36.0)
MCV: 89.1 fL (ref 78.0–100.0)
Platelets: 155 10*3/uL (ref 150–400)
RBC: 4.31 MIL/uL (ref 4.22–5.81)
RDW: 13.7 % (ref 11.5–15.5)
WBC: 5.9 10*3/uL (ref 4.0–10.5)

## 2014-04-28 LAB — GLUCOSE, CAPILLARY
Glucose-Capillary: 176 mg/dL — ABNORMAL HIGH (ref 70–99)
Glucose-Capillary: 159 mg/dL — ABNORMAL HIGH (ref 70–99)

## 2014-04-28 MED ORDER — RAMIPRIL 10 MG PO CAPS: 10.0000 mg | ORAL_CAPSULE | Freq: Every day | ORAL | Status: DC

## 2014-04-28 MED ORDER — ATORVASTATIN CALCIUM 80 MG PO TABS: 80.0000 mg | ORAL_TABLET | Freq: Every day | ORAL | Status: DC

## 2014-04-28 MED ORDER — ESCITALOPRAM OXALATE 20 MG PO TABS: 20.0000 mg | ORAL_TABLET | Freq: Every day | ORAL | Status: DC

## 2014-04-28 MED ORDER — METOPROLOL TARTRATE 25 MG PO TABS: 25.0000 mg | ORAL_TABLET | Freq: Two times a day (BID) | ORAL | Status: DC

## 2014-04-28 MED ORDER — FENOFIBRATE 160 MG PO TABS: 160.0000 mg | ORAL_TABLET | Freq: Every day | ORAL | Status: DC

## 2014-04-28 MED ORDER — LORATADINE 10 MG PO TABS: 10.0000 mg | ORAL_TABLET | Freq: Every day | ORAL | Status: DC

## 2014-04-28 MED ORDER — TICAGRELOR 90 MG PO TABS: 90.0000 mg | ORAL_TABLET | Freq: Two times a day (BID) | ORAL | Status: DC

## 2014-04-28 MED ORDER — ALLOPURINOL 300 MG PO TABS: 300.0000 mg | ORAL_TABLET | Freq: Every day | ORAL | Status: DC

## 2014-04-28 MED ORDER — NITROGLYCERIN 0.4 MG SL SUBL: 0.4000 mg | SUBLINGUAL_TABLET | SUBLINGUAL | Status: DC | PRN

## 2014-04-28 MED FILL — Sodium Chloride IV Soln 0.9%: INTRAVENOUS | Qty: 50 | Status: AC

## 2014-04-28 NOTE — Progress Notes (Signed)
See discharge summary

## 2014-04-28 NOTE — Care Management Note (Addendum)
  Page 1 of 1   04/28/2014     10:39:14 AM CARE MANAGEMENT NOTE 04/28/2014  Patient:  Alec [name], Alec [name]   Account Number:  [id number]  Date Initiated:  04/28/2014  Documentation initiated by:  Shaianne Nucci  Subjective/Objective Assessment:   CP     Action/Plan:   CM to follow for disposition needs   Anticipated DC Date:  04/28/2014   Anticipated DC Plan:  [place]  CM consult  Medication Assistance      Choice offered to / List presented to:             Status of service:  Completed, signed off Medicare Important Message given?   (If response is "NO", the following Medicare IM given date fields will be blank) Date Medicare IM given:   Medicare IM given by:   Date Additional Medicare IM given:   Additional Medicare IM given by:    Discharge Disposition:  HOME/SELF CARE  Per UR Regulation:  Reviewed for med. necessity/level of care/duration of stay  If discussed at [place] of Stay Meetings, dates discussed:    Comments:  Savita Runner RN, BSN, MSHL, CCM  Nurse - Case Manager, (Unit [phone]  04/28/2014 IM - n/a Benefits Check request sent:  Brilinta 90mg  bid CO-PAY   2 A DAY IS $50.00 NO PRE-AUTH REQUIRED MAY USE ANY PHARMACY THAT ACCEPT BCBS PLAN. Pharmacy:  [facility] [place] [facility] [address] [phone] CM verified pharmacy does not have Brilinta in stock CM will advise patient on [place] for 30 day free card use at discharge and request pharmacy preference to order for next refill date. Disposition Plan:  Home / Self Care.

## 2014-04-28 NOTE — Progress Notes (Signed)
CARDIAC REHAB PHASE I   PRE:  Rate/Rhythm: 72 SR  BP:  Supine: 151/98  Sitting:   Standing:    SaO2:   MODE:  Ambulation: 800 ft   POST:  Rate/Rhythm: 95 SR  BP:  Supine: 152/97  Sitting:   Standing:    SaO2:  0745-0840 Pt walked 800 ft with steady gait. No CP. Tolerated well. Education completed with pt and wife. Gave heart healthy and diabetic diets. Briefly discussed carb counting with pt. He is to see his endocrinologist in Sept. Pt informed of HGBA1C at 9.6. He stated usually at 7. Gave brilinta booklet and has stent card. Discussed CRP 2 and pt gave permission to refer to [facility] program. Pt is to get tooth pulled tomorrow. Notified PA so she could discuss with pt if he needs to reschedule.   Graylon Good, RN BSN  04/28/2014 8:38 AM

## 2014-04-28 NOTE — Discharge Summary (Signed)
Discharge Summary   Patient ID: Alec [name] MRN: [id number], DOB/AGE: [birth year] 52 y.o. Admit date: 04/26/2014 D/C date:     04/28/2014  Primary Cardiologist: Dr. Tamala Julian  Principal Problem:   Unstable angina Active Problems:   Type 2 diabetes mellitus   Mixed hyperlipidemia   CAD (coronary artery disease)   Metabolic syndrome   History of echocardiogram   Depression   Gout   Hypertriglyceridemia   Admission Dates: 04/26/14-04/28/14 Discharge Diagnosis: Unstable angina s/p DES to dRCA.   HPI: Alec [name] is a 53 y.o. male with a history of HLD, metabolic syndrome, DM, gout, hyperlipidemia and severe hypertriglyceridemia and no prior cardiac disease who presented to [facility] on 04/26/14 with sx concerning for unstable angina.   He was transferred from [facility] for chest "pressure" on the morning of admission during nonstrenuous activity that was associated with dyspnea, diaphoresis and lightheadedness. The symptoms waxed and waned after that time and was eventually relieved after NTG and heparin.   Hospital Course  Unstable angina- Troponin neg x3 and ECG with nonspecific T wave changes. However, with many cardiac risk factors including gender, dyslipidemia, type 2 diabetes mellitus, and history of premature CAD in his mother.  -- S/p LHC 04/27/14 with placement of DES to dRCA; widely patent L coronary system; normal LVEF  -- 2D ECHO on 04/27/14 with EF 65-70% and mild TR.  -- Continue ASA/Brilinta, atorvastatin 80mg , metoprolol tart 25mg  BID and ramipril 10mg    Elevated D-Dimer - 0.9. CTA negative for PE.   Uncontrolled hyperlipidemia with severe hypertriglyceridemia - TC 400 TG 2,489. With Hx of pancreatitis. TG have historically always run high. Hopefully TG should improve with better control of DM.  -- Continue statin, fenofibrate 160mg , vascepa 1G    Tooth extraction- had a previously scheduled tooth extraction for tomorrow. Dr. Tamala Julian has advised him to cancel this due to his  recent cardiac event and DAPT.  Uncontrolled Type 2 diabetes mellitus. Hg A1c 9.6- will need to follow up with PCP, Dr. Forde Dandy.  -- Counseled on diet and exercise.    The patient has had an uncomplicated hospital course and is recovering well. The radial catheter site is stable. He has been seen by Dr. Tamala Julian today and deemed ready for discharge home. All follow-up appointments have been scheduled. A written Rx for a 30 day free supply of Brilinta and a work excuse note was provided for the patient.  Discharge medications are listed below.    Discharge Vitals: Blood pressure 152/97, pulse 81, temperature 97.7 F (36.5 C), temperature source Oral, resp. rate 20, height 5\' 11"  (1.803 m), weight 257 lb 15 oz (117 kg), SpO2 97.00%.  Labs: Lab Results  Component Value Date   WBC 5.9 04/28/2014   HGB 13.1 04/28/2014   HCT 38.4* 04/28/2014   MCV 89.1 04/28/2014   PLT 155 04/28/2014    Recent Labs Lab 04/26/14 1211  04/28/14 0249  NA 134*  < > 134*  K 4.7  < > 4.2  CL 98  < > 98  CO2 16*  < > 25  BUN 23  < > 11  CREATININE 0.76  < > 0.73  CALCIUM 9.2  < > 8.6  PROT 7.7  --   --   BILITOT 0.2*  --   --   ALKPHOS 50  --   --   ALT 29  --   --   AST 56*  --   --   GLUCOSE 214*  < >  153*  < > = values in this interval not displayed.  Recent Labs  04/26/14 1211 04/26/14 2030 04/27/14 0118 04/27/14 0850  TROPONINI <0.30 <0.30 <0.30 <0.30   Lab Results  Component Value Date   CHOL 419* 04/27/2014   HDL NOT REPORTED DUE TO HIGH TRIGLYCERIDES 04/27/2014   LDLCALC UNABLE TO CALCULATE IF TRIGLYCERIDE OVER 400 mg/dL 04/27/2014   TRIG 2489* 04/27/2014   Lab Results  Component Value Date   DDIMER 0.90* 04/26/2014    Diagnostic Studies/Procedures   Dg Chest 2 View  04/26/2014   CLINICAL DATA:  Chest pain  EXAM: CHEST  2 VIEW  COMPARISON:  07/21/2009  FINDINGS: Normal heart size and vascularity. No focal pneumonia, collapse or consolidation. Negative for edema, effusion or pneumothorax.  Trachea midline. Degenerative changes of the spine.  IMPRESSION: Stable exam.  No acute process.  Ct Angio Chest Pe W/cm &/or Wo Cm  04/26/2014   CLINICAL DATA:  Elevated D-dimer.  EXAM: CT ANGIOGRAPHY CHEST WITH CONTRAST  TECHNIQUE: Multidetector CT imaging of the chest was performed using the standard protocol during bolus administration of intravenous contrast. Multiplanar CT image reconstructions and MIPs were obtained to evaluate the vascular anatomy.  CONTRAST:  100 mL OMNIPAQUE IOHEXOL 350 MG/ML SOLN  COMPARISON:  PA and lateral chest earlier this same day and 07/21/2009.  FINDINGS: No pulmonary embolus is identified. Heart size is normal. No pleural or pericardial effusion. There is no axillary, hilar or mediastinal lymphadenopathy. Minimal aortic atherosclerosis is noted. No definite coronary artery calcifications are seen. The lungs are clear. Visualized upper abdomen demonstrates fatty infiltration of the liver. Thoracic spondylosis is noted. No lytic or sclerotic bony lesion is seen.  Review of the MIP images confirms the above findings.  IMPRESSION: Negative for pulmonary embolus or acute disease.  Fatty infiltration of the liver.     Left Heart Catheterization with Coronary Angiography and PCI Report  Alec [name]  53 y.o.  male  June 14, 1961  Procedure Date: 04/27/2014  Referring Physician: Mali Hilty, M.D.  Primary Cardiologist: Rozann Lesches, M.D.  Primary Physician: Reynold Bowen, M.D.  INDICATIONS: Unstable angina pectoris  PROCEDURE: 1. Left heart catheterization; 2. Coronary angiography; 3. Left ventriculography; 4. FFR; 5. PCI RCA  CONSENT:  The risks, benefits, and details of the procedure were explained in detail to the patient. Risks including death, stroke, heart attack, kidney injury, allergy, limb ischemia, bleeding and radiation injury were discussed. The patient verbalized understanding and wanted to proceed. Informed written consent was obtained.  PROCEDURE  TECHNIQUE: After Xylocaine anesthesia a 5 French Slender sheath was placed in the right radial artery with an angiocath and the modified Seldinger technique. Coronary angiography was done using a 5 F JR 4 and JL 3.5 catheter. Left ventriculography was done using the JR 4 catheter and hand injection. 6000 units of heparin was administered.  A JR-4, 6 [place] guide catheter was used to obtain guiding shots. FFR was then performed. The wire was positioned distally. An FFR of 0.77 was obtained with hyperemia induced by contrast injection. Adenosine was not infused. The patient received a bolus followed by an infusion of bivalirudin prior to placing the wire in the coronary.  PCI was felt to be indicated and we proceeded using the Verrata wire as our rail for the procedure. We predilated with a 4.0 x 15 the Emerge balloon distally and in the more proximal lesion. We then positioned and deployed a 4.0 x 24 mm long Promus Premier in the distal stenosis  at 16 atmospheres. We then direct stented the proximal stenosis with a 4.0 x 20 mm Promus Premier deployed at [employer]. Postdilatation was performed with a 5.0 x 15 [place] Emerge balloon. This was performed distally as well as in the proximal stent to 13 atmospheres in both locations. The proximal stent using a 5.5 x 15 [place] Emerge to 13 atmospheres x2. The angiographic appearance was acceptable and the case was terminated.  Hemostasis was achieved with a wrist band.  CONTRAST: Total of 255 cc.  COMPLICATIONS: None  HEMODYNAMICS: Aortic pressure 115/75 mmHg; LV pressure 116/20 mmHg; LVEDP 23 mmHg;  ANGIOGRAPHIC DATA: The left main coronary artery is large caliber and widely patent.  The left anterior descending artery is widely patent and reaches the left ventricular apex. The LAD gives origin to a large branching first diagonal that supplies much of the lateral wall. No significant obstructions are noted.  The left circumflex artery is widely patent giving origin  to two small obtuse marginal.  The right coronary artery is dominant. The proximal vessel is greater than 5 mm in diameter and contains an eccentric 80% stenosis. The distal vessel contains 6 mental tandem stenoses of 80-95%. Large PDA and left ventricular branches arise distally.  PCI RESULTS: Proximal 80% stenosis is reduced to 0% with TIMI grade 3 flow and post dilatation to 5.5 mm in diameter using a Promus Premier drug-eluting stent. The distal stenosis is reduced from tandem 95% stenoses to 0% with TIMI grade 3 flow and post dilated to 5.0 mm in diameter.  LEFT VENTRICULOGRAM: Left ventricular angiogram was done in the 30 RAO projection and revealed normal LV cavity size with EF of 60%.  IMPRESSIONS: 1. Severe proximal and distal RCA stenosis treated with DES from 85% to 0% proximally and tandem 90% to 0% distally. Final postdilatation diameter is greater than 5 mm at each site.  2. Widely patent left coronary system  3. Normal left ventricular function  RECOMMENDATION: Aspirin and Brilinta. Likely discharge in a.m. if no complications.  2D ECHO: 04/27/2014 LV EF: 65% - 70% Study Conclusions - Left ventricle: The cavity size was normal. There was moderate concentric hypertrophy. Systolic function was vigorous. The estimated ejection fraction was in the range of 65% to 70%. Wall motion was normal; there were no regional wall motion abnormalities. Left ventricular diastolic function parameters were normal. - Aortic valve: Trileaflet; mildly thickened, mildly calcified leaflets. There was no regurgitation. - Mitral valve: Structurally normal valve. - Left atrium: The atrium was normal in size. - Right atrium: The atrium was normal in size. - Tricuspid valve: There was mild regurgitation. - Pulmonic valve: There was no regurgitation. - Pulmonary arteries: Systolic pressure was within the normal range. Impressions: - Mild tricuspid regurgitation, otherwise normal study.    Discharge  Medications     Medication List    STOP taking these medications       ibuprofen 200 MG tablet  Commonly known as:  ADVIL,MOTRIN     naproxen sodium 220 MG tablet  Commonly known as:  ANAPROX      TAKE these medications       allopurinol 300 MG tablet  Commonly known as:  ZYLOPRIM  Take 1 tablet (300 mg total) by mouth daily.     aspirin EC 81 MG tablet  Take 81 mg by mouth daily.     atorvastatin 80 MG tablet  Commonly known as:  LIPITOR  Take 1 tablet (80 mg total) by mouth daily at  6 PM.     cetirizine 10 MG chewable tablet  Commonly known as:  ZYRTEC  Chew 10 mg by mouth daily.     escitalopram 20 MG tablet  Commonly known as:  LEXAPRO  Take 1 tablet (20 mg total) by mouth daily.     fenofibrate 160 MG tablet  Take 1 tablet (160 mg total) by mouth daily.     HUMULIN R 500 UNIT/ML Soln injection  Generic drug:  insulin regular human CONCENTRATED  Inject 30 Units into the skin 2 (two) times daily.     HYDROcodone-acetaminophen 10-325 MG per tablet  Commonly known as:  NORCO  Take 1 tablet by mouth every 6 (six) hours as needed (pain).     JARDIANCE 10 MG Tabs  Generic drug:  Empagliflozin  Take 10 mg by mouth daily.     loratadine 10 MG tablet  Commonly known as:  CLARITIN  Take 1 tablet (10 mg total) by mouth daily.     metoprolol tartrate 25 MG tablet  Commonly known as:  LOPRESSOR  Take 1 tablet (25 mg total) by mouth 2 (two) times daily.     nitroGLYCERIN 0.4 MG SL tablet  Commonly known as:  NITROSTAT  Place 1 tablet (0.4 mg total) under the tongue every 5 (five) minutes x 3 doses as needed for chest pain.     ramipril 10 MG capsule  Commonly known as:  ALTACE  Take 1 capsule (10 mg total) by mouth daily.     ticagrelor 90 MG Tabs tablet  Commonly known as:  BRILINTA  Take 1 tablet (90 mg total) by mouth 2 (two) times daily.     VASCEPA 1 G Caps  Generic drug:  Icosapent Ethyl  Take 4 g by mouth daily.        Disposition   The  patient will be discharged in stable condition to home. Discharge Instructions   Amb Referral to Cardiac Rehabilitation    Complete by:  As directed           Follow-up Information   Follow up with Richardson Dopp, PA-C On 05/11/2014. (9:30 am  (will see Ivin Booty))    Specialty:  Physician Assistant   Contact information:   [address] [place] [address] [phone]       Follow up with Sheela Stack, MD. (Please follow up with your primary care provider )    Specialty:  Endocrinology   Contact information:   [place] [address] [phone]         Duration of Discharge Encounter: Greater than 30 minutes including physician and PA time.  SignedVertell Limber, Iyauna Sing PA-C 04/28/2014, 11:27 AM

## 2014-04-28 NOTE — Progress Notes (Signed)
Patient Name: Alec [name] Date of Encounter: 04/28/2014     Active Problems:   Unstable angina   Type 2 diabetes mellitus   Mixed hyperlipidemia    SUBJECTIVE  Mild SOB. No chest pain. Ready for home. Would like to follow up in [place] with Dr. Tamala Julian.   CURRENT MEDS . allopurinol  300 mg Oral Daily  . aspirin EC  81 mg Oral Daily  . atorvastatin  80 mg Oral q1800  . Empagliflozin  10 mg Oral Daily  . escitalopram  20 mg Oral Daily  . fenofibrate  160 mg Oral Daily  . Icosapent Ethyl  4 g Oral Daily  . insulin aspart  0-20 Units Subcutaneous TID WC  . insulin aspart  0-5 Units Subcutaneous QHS  . insulin aspart  4 Units Subcutaneous TID WC  . insulin regular human CONCENTRATED  150 Units Subcutaneous BID WC  . loratadine  10 mg Oral Daily  . metoprolol tartrate  25 mg Oral BID  . ramipril  10 mg Oral Daily  . ticagrelor  90 mg Oral BID    OBJECTIVE  Filed Vitals:   04/27/14 2300 04/28/14 0000 04/28/14 0500 04/28/14 0558  BP: 120/91 99/47  162/89  Pulse: 66 59  79  Temp:  98 F (36.7 C)  97.6 F (36.4 C)  TempSrc:  Oral  Oral  Resp: 18 18  20   Height:      Weight:  257 lb 15 oz (117 kg) 257 lb 15 oz (117 kg)   SpO2: 97% 92%  95%    Intake/Output Summary (Last 24 hours) at 04/28/14 0623 Last data filed at 04/27/14 2300  Gross per 24 hour  Intake 1040.05 ml  Output      0 ml  Net 1040.05 ml   Filed Weights   04/27/14 0500 04/28/14 0000 04/28/14 0500  Weight: 256 lb 6.4 oz (116.302 kg) 257 lb 15 oz (117 kg) 257 lb 15 oz (117 kg)    PHYSICAL EXAM  General: Pleasant, NAD. Obese.  Neuro: Alert and oriented X 3. Moves all extremities spontaneously. Psych: Normal affect. HEENT:  Normal  Neck: Supple without bruits or JVD. Lungs:  Resp regular and unlabored, CTA. Heart: RRR no s3, s4, or murmurs. Abdomen: Soft, non-tender, non-distended, BS + x 4.  Extremities: No clubbing, cyanosis or edema. DP/PT/Radials 2+ and equal bilaterally.  Accessory Clinical  Findings  CBC  Recent Labs  04/26/14 1130 04/27/14 0850 04/28/14 0249  WBC 7.8 5.7 5.9  NEUTROABS 4.7  --   --   HGB 15.8 14.0 13.1  HCT 44.9 40.3 38.4*  MCV 87.0 90.0 89.1  PLT 267 187 854   Basic Metabolic Panel  Recent Labs  04/26/14 1211 04/26/14 2030 04/27/14 0850 04/28/14 0249  NA 134*  --  134* 134*  K 4.7  --  4.6 4.2  CL 98  --  98 98  CO2 16*  --  24 25  GLUCOSE 214*  --  185* 153*  BUN 23  --  13 11  CREATININE 0.76  --  0.66 0.73  CALCIUM 9.2  --  8.2* 8.6  MG  --  1.8  --   --    Liver Function Tests  Recent Labs  04/26/14 1211  AST 56*  ALT 29  ALKPHOS 50  BILITOT 0.2*  PROT 7.7  ALBUMIN 3.2*   Cardiac Enzymes  Recent Labs  04/26/14 2030 04/27/14 0118 04/27/14 0850  TROPONINI <0.30 <0.30 <0.30  D-Dimer  Recent Labs  04/26/14 1130  DDIMER 0.90*   Hemoglobin A1C  Recent Labs  04/26/14 2030  HGBA1C 9.6*   Fasting Lipid Panel  Recent Labs  04/27/14 0850  CHOL 419*  HDL NOT REPORTED DUE TO HIGH TRIGLYCERIDES  LDLCALC UNABLE TO CALCULATE IF TRIGLYCERIDE OVER 400 mg/dL  TRIG 2489*  CHOLHDL NOT REPORTED DUE TO HIGH TRIGLYCERIDES   Thyroid Function Tests  Recent Labs  04/26/14 2030  TSH 1.590    TELE  Sinus brady. HR 57  Radiology/Studies  Dg Chest 2 View  04/26/2014   CLINICAL DATA:  Chest pain  EXAM: CHEST  2 VIEW  COMPARISON:  07/21/2009  FINDINGS: Normal heart size and vascularity. No focal pneumonia, collapse or consolidation. Negative for edema, effusion or pneumothorax. Trachea midline. Degenerative changes of the spine.  IMPRESSION: Stable exam.  No acute process   Electronically Signed   By: Daryll Brod M.D.   On: 04/26/2014 11:31   Ct Angio Chest Pe W/cm &/or Wo Cm  04/26/2014   CLINICAL DATA:  Elevated D-dimer.  EXAM: CT ANGIOGRAPHY CHEST WITH CONTRAST  TECHNIQUE: Multidetector CT imaging of the chest was performed using the standard protocol during bolus administration of intravenous contrast.  Multiplanar CT image reconstructions and MIPs were obtained to evaluate the vascular anatomy.  CONTRAST:  100 mL OMNIPAQUE IOHEXOL 350 MG/ML SOLN  COMPARISON:  PA and lateral chest earlier this same day and 07/21/2009.  FINDINGS: No pulmonary embolus is identified. Heart size is normal. No pleural or pericardial effusion. There is no axillary, hilar or mediastinal lymphadenopathy. Minimal aortic atherosclerosis is noted. No definite coronary artery calcifications are seen. The lungs are clear. Visualized upper abdomen demonstrates fatty infiltration of the liver. Thoracic spondylosis is noted. No lytic or sclerotic bony lesion is seen.  Review of the MIP images confirms the above findings.  IMPRESSION: Negative for pulmonary embolus or acute disease.  Fatty infiltration of the liver.   Left Heart Catheterization with Coronary Angiography and PCI Report  Jammy Plotkin Yates  53 y.o.  male  1961-01-01  Procedure Date: 04/27/2014  Referring Physician: Mali Hilty, M.D.  Primary Cardiologist: Rozann Lesches, M.D.  Primary Physician: Reynold Bowen, M.D.  INDICATIONS: Unstable angina pectoris  PROCEDURE: 1. Left heart catheterization; 2. Coronary angiography; 3. Left ventriculography; 4. FFR; 5. PCI RCA  CONSENT:  The risks, benefits, and details of the procedure were explained in detail to the patient. Risks including death, stroke, heart attack, kidney injury, allergy, limb ischemia, bleeding and radiation injury were discussed. The patient verbalized understanding and wanted to proceed. Informed written consent was obtained.  PROCEDURE TECHNIQUE: After Xylocaine anesthesia a 5 French Slender sheath was placed in the right radial artery with an angiocath and the modified Seldinger technique. Coronary angiography was done using a 5 F JR 4 and JL 3.5 catheter. Left ventriculography was done using the JR 4 catheter and hand injection. 6000 units of heparin was administered.  A JR-4, 6 [place] guide catheter was  used to obtain guiding shots. FFR was then performed. The wire was positioned distally. An FFR of 0.77 was obtained with hyperemia induced by contrast injection. Adenosine was not infused. The patient received a bolus followed by an infusion of bivalirudin prior to placing the wire in the coronary.  PCI was felt to be indicated and we proceeded using the Verrata wire as our rail for the procedure. We predilated with a 4.0 x 15 the Emerge balloon distally and in the more proximal  lesion. We then positioned and deployed a 4.0 x 24 mm long Promus Premier in the distal stenosis at 16 atmospheres. We then direct stented the proximal stenosis with a 4.0 x 20 mm Promus Premier deployed at [employer]. Postdilatation was performed with a 5.0 x 15 [place] Emerge balloon. This was performed distally as well as in the proximal stent to 13 atmospheres in both locations. The proximal stent using a 5.5 x 15 [place] Emerge to 13 atmospheres x2. The angiographic appearance was acceptable and the case was terminated.  Hemostasis was achieved with a wrist band.  CONTRAST: Total of 255 cc.  COMPLICATIONS: None  HEMODYNAMICS: Aortic pressure 115/75 mmHg; LV pressure 116/20 mmHg; LVEDP 23 mmHg;  ANGIOGRAPHIC DATA: The left main coronary artery is large caliber and widely patent.  The left anterior descending artery is widely patent and reaches the left ventricular apex. The LAD gives origin to a large branching first diagonal that supplies much of the lateral wall. No significant obstructions are noted.  The left circumflex artery is widely patent giving origin to two small obtuse marginal.  The right coronary artery is dominant. The proximal vessel is greater than 5 mm in diameter and contains an eccentric 80% stenosis. The distal vessel contains 6 mental tandem stenoses of 80-95%. Large PDA and left ventricular branches arise distally.  PCI RESULTS: Proximal 80% stenosis is reduced to 0% with TIMI grade 3 flow and post  dilatation to 5.5 mm in diameter using a Promus Premier drug-eluting stent. The distal stenosis is reduced from tandem 95% stenoses to 0% with TIMI grade 3 flow and post dilated to 5.0 mm in diameter.  LEFT VENTRICULOGRAM: Left ventricular angiogram was done in the 30 RAO projection and revealed normal LV cavity size with EF of 60%.  IMPRESSIONS: 1. Severe proximal and distal RCA stenosis treated with DES from 85% to 0% proximally and tandem 90% to 0% distally. Final postdilatation diameter is greater than 5 mm at each site.  2. Widely patent left coronary system  3. Normal left ventricular function  RECOMMENDATION: Aspirin and Brilinta. Likely discharge in a.m. if no complications.    2D ECHO: 04/27/2014 LV EF: 65% - 70% Study Conclusions - Left ventricle: The cavity size was normal. There was moderate concentric hypertrophy. Systolic function was vigorous. The estimated ejection fraction was in the range of 65% to 70%. Wall motion was normal; there were no regional wall motion abnormalities. Left ventricular diastolic function parameters were normal. - Aortic valve: Trileaflet; mildly thickened, mildly calcified leaflets. There was no regurgitation. - Mitral valve: Structurally normal valve. - Left atrium: The atrium was normal in size. - Right atrium: The atrium was normal in size. - Tricuspid valve: There was mild regurgitation. - Pulmonic valve: There was no regurgitation. - Pulmonary arteries: Systolic pressure was within the normal range. Impressions: - Mild tricuspid regurgitation, otherwise normal study.    ASSESSMENT AND PLAN Azad Calame Daffron is a 53 y.o. male with a history of HLD, metabolic syndrome, DM, gout, and no prior cardiac disease who presented to [facility] on 04/26/14  with sx concerning for unstable angina.   Unstable angina- Troponin neg x3 and ECG with nonspecific T wave changes. However, with many cardiac risk factors including gender, dyslipidemia, type 2 diabetes  mellitus, and history of premature CAD in his mother.  -- s/p LHC 04/27/14 with placement of DES to [facility]; widely patent L coronary system; normal LVEF -- 2D ECHO on 04/27/14 with EF 65-70% and mild  TR. -- Continue ASA/Brilinta, atorvastatin 80mg , metoprolol tart 25mg  BID  Elevated D-Dimer - 0.9. CTA negative for PE.  Uncontrolled hyperlipidemia with severe hypertriglyceridemia - TC 400 TG 2,489. With Hx of pancreatitis. TG have historically always run high. Hopefully TG should improve with better control of DM. -- Continue statin   Tooth extraction- has a previously scheduled tooth extraction for tomorrow. Will defer recommendations to Dr. Tamala Julian. MD to see.   Uncontrolled Type 2 diabetes mellitus. Hg A1c 9.6- will need to follow up with PCP, Dr. Forde Dandy.  -- Counseled on diet and exercise.   Dispo- radial site stable. Likely home today    Signed, [facility]  Pager [phone]

## 2014-04-28 NOTE — Discharge Summary (Signed)
Patient interviewed and examined. Labs reviewed.No significant abnormality noted.Exam including cath site is unremarkable.  Plan home today with f/u in 1-2 weeks.

## 2014-04-28 NOTE — Progress Notes (Addendum)
TR BAND REMOVAL  LOCATION:    right radial  DEFLATED PER PROTOCOL:    Yes.    TIME BAND OFF / DRESSING APPLIED:    23:00  SITE UPON ARRIVAL:    Level 0  SITE AFTER BAND REMOVAL:    Level 0  REVERSE ALLEN'S TEST:     positive  CIRCULATION SENSATION AND MOVEMENT:    Within Normal Limits   Yes.    COMMENTS:

## 2014-04-28 NOTE — Progress Notes (Addendum)
Pt's AM blood sugar 176. Per Perry Mount, PA, give patient his scheduled 4 units of novolog plus an additional 4 units sliding scale coverage (8 units total). Wait one hour, recheck blood sugar. Physician or PA will then determine whether or not to give any U500 insulin.  Will continue to monitor. -Ahniyah Giancola,RN   Pt states he does not take his U500 insulin at home if his blood sugar is less than 200. Discussed this with Perry Mount, PA. Per Curt Bears it is ok to cancel the scheduled dose of U500 this morning.  Will continue to monitor. Roselyn Reef Triniti Gruetzmacher,RN

## 2014-04-28 NOTE — Progress Notes (Signed)
Inpatient Diabetes Program Recommendations  AACE/ADA: New Consensus Statement on Inpatient Glycemic Control (2013)  Target Ranges:  Prepandial:   less than 140 mg/dL      Peak postprandial:   less than 180 mg/dL (1-2 hours)      Critically ill patients:  140 - 180 mg/dL   Reason for Visit: Education re: insulin administration  Diabetes history: Type 2 Outpatient Diabetes medications: Outpatient Diabetes medications: U500 30 units bid and Jardience 10 mg  Current orders for Inpatient glycemic control: U500 30 units bid (15ml), Novolog correction 0-5units at bed, Novolog correction 0-20 units at meals, Novolog 4 units at meals  I spoke with patient regarding action of U500 insulin and Humalog insulin.  Patient stopped taking the U500 at lunchtime as ordered by the endocrinologist because he was having low blood sugars around 3-4pm.  He always takes U500 in the am and at bedtime but sometimes adjusts the dose if his BG is less than 200mg /dl.  I have asked him to schedule a visit next week if possible with Dr. Forde Dandy so he can discuss the changes he has made to his insulins.   Inpatient Diabetes Program Recommendations:  Insulin - Basal: If the patient stays through the afternoon, please consider ordering low dose basal insulin, Levemir 15 units now since he did not receive U500 this am.  Can resume U500 at home this evening at 1/3 his normal dose.  Please have patient take Novolog mealtime insulin along with Novolog correction for lunch.   Patient needs to check blood sugars very frequently, at least Q4hours.  Gentry Fitz, RN, BA, MHA, CDE Diabetes Coordinator Inpatient Diabetes Program  [phone] (Team Pager) [phone] [facility]) 04/28/2014 11:37 AM

## 2014-04-28 NOTE — Progress Notes (Signed)
Inpatient Diabetes Program Recommendations  AACE/ADA: New Consensus Statement on Inpatient Glycemic Control (2013)  Target Ranges:  Prepandial:   less than 140 mg/dL      Peak postprandial:   less than 180 mg/dL (1-2 hours)      Critically ill patients:  140 - 180 mg/dL   Results for Alec [name], Alec [name] (MRN [id number]) as of 04/28/2014 08:44  Ref. Range 04/27/2014 11:44 04/27/2014 15:45 04/27/2014 18:57 04/27/2014 20:23 04/28/2014 07:54  Glucose-Capillary Latest Range: 70-99 mg/dL 198 (H) 138 (H) 132 (H) 180 (H) 176 (H)   Reason for assessment: U500 order  Diabetes history: Type 2 Outpatient Diabetes medications: U500 30 units bid and Jardience 10 mg  Current orders for Inpatient glycemic control: U500 30 units bid (13ml), Novolog correction 0-5units at bed, Novolog correction 0-20 units at meals, Novolog 4 units at meals  CBG this am 176mg /dl.  Spoke with RN and recommended she consult MD regarding U500 dose ordered for this am.  Considering the high concentration of the U500, this dose may be too much based on current BG.   Gentry Fitz, RN, BA, MHA, CDE Diabetes Coordinator Inpatient Diabetes Program  [phone] (Team Pager) [phone] [facility]) 04/28/2014 8:45 AM

## 2014-05-04 ENCOUNTER — Encounter: Payer: Self-pay | Admitting: *Deleted

## 2014-05-11 ENCOUNTER — Ambulatory Visit (INDEPENDENT_AMBULATORY_CARE_PROVIDER_SITE_OTHER): Payer: BC Managed Care – PPO | Admitting: Physician Assistant

## 2014-05-11 ENCOUNTER — Encounter: Payer: Self-pay | Admitting: Physician Assistant

## 2014-05-11 VITALS — BP 114/78 | HR 76 | Ht 71.0 in | Wt 254.0 lb

## 2014-05-11 DIAGNOSIS — I2510 Atherosclerotic heart disease of native coronary artery without angina pectoris: Secondary | ICD-10-CM

## 2014-05-11 NOTE — Patient Instructions (Signed)
Your physician recommends that you schedule a follow-up appointment in: [place]. Tamala Julian

## 2014-05-11 NOTE — Progress Notes (Signed)
Cardiology Office Note    Date:  05/11/2014   ID:  Alec [name], DOB 04/27/1961, MRN [id number]  PCP:  No primary provider on file.  Cardiologist: Dr. Tamala Julian     History of Present Illness: Alec [name] is a 53 y.o. male with a history of HLD, metabolic syndrome, DM, gout, hyperlipidemia, severe hypertriglyceridemia, and CAD s/p recent hospitalization with DES to dRCA on 04/27/14 who presents to the clinic for post-hospitalization follow up.   He is doing very well from a cardiac stand point and "feeling the best he has felt in years." He has had no chest pain or SOB and has been tolerating all of the medications well. No lightheadedness, dizziness or syncope. He denies orthopnea, PND or LE swelling. He denied cardiac rehab and has been doing his own walking program. He has been walking up to 30 minutes everyday with no symptoms and good energy. He does have some tooth pain associated with a broken tooth that needs extraction. No blood in stool or urine.   Studies:  - LHC (04/27/14) with placement of DES to dRCA; widely patent Left coronary system; normal LVEF   - Echo (04/27/14) with EF 65-70% and mild TR.    Recent Labs/Images: 04/26/2014: ALT 29; Pro B Natriuretic peptide (BNP) 16.1; TSH 1.590  04/27/2014: HDL Cholesterol by NMR NOT REPORTED DUE TO HIGH TRIGLYCERIDES; LDL (calc) UNABLE TO CALCULATE IF TRIGLYCERIDE OVER 400 mg/dL  04/28/2014: Creatinine 0.73; Hemoglobin 13.1; Potassium 4.2   Dg Chest 2 View  04/26/2014   CLINICAL DATA:  Chest pain  EXAM: CHEST  2 VIEW  COMPARISON:  07/21/2009  FINDINGS: Normal heart size and vascularity. No focal pneumonia, collapse or consolidation. Negative for edema, effusion or pneumothorax. Trachea midline. Degenerative changes of the spine.  IMPRESSION: Stable exam.  No acute process    Ct Angio Chest Pe W/cm &/or Wo Cm  04/26/2014   CLINICAL DATA:  Elevated D-dimer.  EXAM: CT ANGIOGRAPHY CHEST WITH CONTRAST  TECHNIQUE: Multidetector CT  imaging of the chest was performed using the standard protocol during bolus administration of intravenous contrast. Multiplanar CT image reconstructions and MIPs were obtained to evaluate the vascular anatomy.  CONTRAST:  100 mL OMNIPAQUE IOHEXOL 350 MG/ML SOLN  COMPARISON:  PA and lateral chest earlier this same day and 07/21/2009.  FINDINGS: No pulmonary embolus is identified. Heart size is normal. No pleural or pericardial effusion. There is no axillary, hilar or mediastinal lymphadenopathy. Minimal aortic atherosclerosis is noted. No definite coronary artery calcifications are seen. The lungs are clear. Visualized upper abdomen demonstrates fatty infiltration of the liver. Thoracic spondylosis is noted. No lytic or sclerotic bony lesion is seen.  Review of the MIP images confirms the above findings.  IMPRESSION: Negative for pulmonary embolus or acute disease.  Fatty infiltration of the liver.      Wt Readings from Last 3 Encounters:  04/28/14 257 lb 15 oz (117 kg)  04/28/14 257 lb 15 oz (117 kg)  10/24/11 240 lb (108.863 kg)     Past Medical History  Diagnosis Date  . Mixed hyperlipidemia   . Metabolic syndrome   . Pancreatitis   . Type 2 diabetes mellitus   . Arthritis   . Chronic lower back pain   . Gout   . Depression   . CAD (coronary artery disease)     a. 04/27/14 DES to dRCA; widely patent L coronary system  . History of echocardiogram     a. 04/27/14 with EF  65-70% and mild TR.    Current Outpatient Prescriptions  Medication Sig Dispense Refill  . allopurinol (ZYLOPRIM) 300 MG tablet Take 1 tablet (300 mg total) by mouth daily.  30 tablet  11  . aspirin EC 81 MG tablet Take 81 mg by mouth daily.      Marland Kitchen atorvastatin (LIPITOR) 80 MG tablet Take 1 tablet (80 mg total) by mouth daily at 6 PM.  30 tablet  11  . cetirizine (ZYRTEC) 10 MG chewable tablet Chew 10 mg by mouth daily.      Marland Kitchen escitalopram (LEXAPRO) 20 MG tablet Take 1 tablet (20 mg total) by mouth daily.  30 tablet  11    . fenofibrate 160 MG tablet Take 1 tablet (160 mg total) by mouth daily.  30 tablet  11  . HYDROcodone-acetaminophen (NORCO) 10-325 MG per tablet Take 1 tablet by mouth every 6 (six) hours as needed (pain).       . insulin regular human CONCENTRATED (HUMULIN R) 500 UNIT/ML SOLN injection Inject 30 Units into the skin 2 (two) times daily.       Marland Kitchen JARDIANCE 10 MG TABS Take 10 mg by mouth daily.      Marland Kitchen loratadine (CLARITIN) 10 MG tablet Take 1 tablet (10 mg total) by mouth daily.      . metoprolol tartrate (LOPRESSOR) 25 MG tablet Take 1 tablet (25 mg total) by mouth 2 (two) times daily.  60 tablet  6  . nitroGLYCERIN (NITROSTAT) 0.4 MG SL tablet Place 1 tablet (0.4 mg total) under the tongue every 5 (five) minutes x 3 doses as needed for chest pain.  25 tablet  12  . ramipril (ALTACE) 10 MG capsule Take 1 capsule (10 mg total) by mouth daily.  30 capsule  0  . ticagrelor (BRILINTA) 90 MG TABS tablet Take 1 tablet (90 mg total) by mouth 2 (two) times daily.  60 tablet  11  . VASCEPA 1 G CAPS Take 4 g by mouth daily.       No current facility-administered medications for this visit.     Allergies:   Percocet   Social History:  The patient  reports that he has never smoked. He has never used smokeless tobacco. He reports that he drinks alcohol. He reports that he does not use illicit drugs.   Family History:  The patient's family history includes Coronary artery disease in his mother.   ROS:  Please see the history of present illness..  All other systems reviewed and negative.   PHYSICAL EXAM: VS:  There were no vitals taken for this visit. Well nourished, well developed, in no acute distress HEENT: normal Neck: no JVD Cardiac:  normal S1, S2; RRR; no murmur Lungs:  clear to auscultation bilaterally, no wheezing, rhonchi or rales Abd: soft, nontender, no hepatomegaly Ext: no edema Skin: warm and dry Neuro:  CNs 2-12 intact, no focal abnormalities noted  EKG:  NSR     ASSESSMENT AND  PLAN: Alec [name] is a 53 y.o. male with a history of HLD, metabolic syndrome, DM, gout, hyperlipidemia, severe hypertriglyceridemia, and CAD s/p recent hospitalization with DES to dRCA for [place] on 04/27/14 who presents to the clinic for post-hospitalization follow up.   CAD-- Recent hospitalization for [place]. Troponin remained negative. S/p LHC 04/27/14 with placement of DES to dRCA; widely patent L coronary system; normal LVEF  -- 2D ECHO on 04/27/14 with EF 65-70% and mild TR.  -- Continue ASA/Brilinta, atorvastatin 80mg , metoprolol tart  25mg  BID and ramipril 10mg     Uncontrolled hyperlipidemia with severe hypertriglyceridemia - Last lipid panel with TC 400 TG 2,489. With Hx of pancreatitis. TG have historically always run high. Hopefully TG should improve with better control of DM.  -- Continue statin (on Caduet), fenofibrate 160mg , vascepa 1G. -- Followed by Dr. Forde Dandy  Tooth extraction- had a previously scheduled tooth extraction the day after discharge. Dr. Tamala Julian has advised him to cancel this due to his recent cardiac event and DAPT. He is continuing to have pain associated with his tooth and wanting extraction. I advised him to treat medically for now as we do not want to interrupt DAPT for at least 1 year. If the dentist feels confident pulling the tooth with the patient on DAPT then he can arrange this with his dental provider.   Uncontrolled Type 2 diabetes mellitus. Hg A1c 9.6. Followed by Dr. Forde Dandy.  -- Counseled on diet and exercise.   Dispo- the patient requested a return to work note. He works with [employer] (medical) and wanted to make sure this was okay with his DES. I provided him with a note to medically clear him for work.   Signed, Gareth Morgan, MHS 05/11/2014 9:13 AM    [facility] [address], [place], [place]  [address] Phone: [phone]; Fax: [phone]

## 2014-07-01 ENCOUNTER — Other Ambulatory Visit: Payer: Self-pay

## 2014-07-05 ENCOUNTER — Encounter: Payer: Self-pay | Admitting: Interventional Cardiology

## 2014-07-05 ENCOUNTER — Ambulatory Visit (INDEPENDENT_AMBULATORY_CARE_PROVIDER_SITE_OTHER): Payer: BC Managed Care – PPO | Admitting: Interventional Cardiology

## 2014-07-05 VITALS — BP 108/70 | HR 73 | Ht 71.0 in | Wt 261.0 lb

## 2014-07-05 DIAGNOSIS — E1159 Type 2 diabetes mellitus with other circulatory complications: Secondary | ICD-10-CM

## 2014-07-05 DIAGNOSIS — I2510 Atherosclerotic heart disease of native coronary artery without angina pectoris: Secondary | ICD-10-CM

## 2014-07-05 DIAGNOSIS — E8881 Metabolic syndrome: Secondary | ICD-10-CM

## 2014-07-05 DIAGNOSIS — E782 Mixed hyperlipidemia: Secondary | ICD-10-CM

## 2014-07-05 NOTE — Patient Instructions (Signed)
Your physician recommends that you continue on your current medications as directed. Please refer to the Current Medication list given to you today. Your physician wants you to follow-up in: 4 months You will receive a reminder letter in the mail two months in advance. If you don't receive a letter, please call our office to schedule the follow-up appointment.  

## 2014-07-05 NOTE — Progress Notes (Signed)
Patient ID: Alec [name], male   DOB: 05/24/1961, 53 y.o.   MRN: [id number]    [address], [address] [place], [place]  [address] Phone: [phone] Fax:  [phone]  Date:  07/05/2014   ID:  Alec [name], DOB June 13, 1961, MRN [id number]  PCP:  No primary provider on file.   ASSESSMENT:  1. Coronary artery disease with recent stent RCA with DES, asymptomatic with complete resolution of chest tightness and dyspnea the patient had been experiencing prior to the procedure. 2. Hypertension, controlled 3. Hyperlipidemia on therapy and followed by Dr. Forde Dandy . 4. Hypertension  PLAN:  1. Continue dual antiplatelet therapy 2. Increase physical activity 3. Lipids are being followed by Dr. Forde Dandy 4. Clinic followup in 4 months   SUBJECTIVE: Alec [name] is a 53 y.o. male who is now doing well status post PCI of the RCA. He presented with unstable angina and marked exertional dyspnea and chest tightness. Since stenting of the right coronary is done well. Senna recurrence of symptoms. No medication side effects. The final conclusion of the catheter procedure as follows: IMPRESSIONS: 1. Severe proximal and distal RCA stenosis treated with DES from 85% to 0% proximally and tandem 90% to 0% distally. Final postdilatation diameter is greater than 5 mm at each site.  2. Widely patent left coronary system  3. Normal left ventricular function   Wt Readings from Last 3 Encounters:  07/05/14 261 lb (118.389 kg)  05/11/14 254 lb (115.214 kg)  04/28/14 257 lb 15 oz (117 kg)     Past Medical History  Diagnosis Date  . Mixed hyperlipidemia   . Metabolic syndrome   . History of pancreatitis   . Type 2 diabetes mellitus   . Arthritis   . Chronic lower back pain   . Gout   . Depression   . CAD (coronary artery disease)     a. 04/27/14 DES to dRCA; widely patent L coronary system  . History of echocardiogram     a. 04/27/14 with EF 65-70% and mild TR.    Current Outpatient  Prescriptions  Medication Sig Dispense Refill  . allopurinol (ZYLOPRIM) 300 MG tablet Take 1 tablet (300 mg total) by mouth daily.  30 tablet  11  . aspirin EC 81 MG tablet Take 81 mg by mouth daily.      Marland Kitchen CADUET 10-80 MG per tablet Take 1 tablet by mouth daily.       . cetirizine (ZYRTEC) 10 MG tablet Take 10 mg by mouth daily.      Marland Kitchen escitalopram (LEXAPRO) 20 MG tablet Take 1 tablet (20 mg total) by mouth daily.  30 tablet  11  . fenofibrate 160 MG tablet Take 1 tablet (160 mg total) by mouth daily.  30 tablet  11  . HYDROcodone-acetaminophen (NORCO) 10-325 MG per tablet Take 1 tablet by mouth every 6 (six) hours as needed (pain).       . insulin regular human CONCENTRATED (HUMULIN R) 500 UNIT/ML SOLN injection Inject 30 Units into the skin 2 (two) times daily. 1.25 units per hour, bolus as needed      . loratadine (CLARITIN) 10 MG tablet Take 10 mg by mouth daily as needed.      . nitroGLYCERIN (NITROSTAT) 0.4 MG SL tablet Place 1 tablet (0.4 mg total) under the tongue every 5 (five) minutes x 3 doses as needed for chest pain.  25 tablet  12  . ticagrelor (BRILINTA) 90 MG TABS tablet  Take 1 tablet (90 mg total) by mouth 2 (two) times daily.  60 tablet  11  . VASCEPA 1 G CAPS Take 4 g by mouth daily.      Marland Kitchen BAYER CONTOUR TEST test strip 1 each by Other route as needed.       . Empagliflozin (JARDIANCE) 25 MG TABS Take 25 mg by mouth daily.      . ramipril (ALTACE) 10 MG capsule Take 1 capsule (10 mg total) by mouth daily.  30 capsule  0   No current facility-administered medications for this visit.    Allergies:    Allergies  Allergen Reactions  . Percocet [Oxycodone-Acetaminophen] Nausea Only    Makes patient feel not with it    Social History:  The patient  reports that he has never smoked. He has never used smokeless tobacco. He reports that he drinks alcohol. He reports that he does not use illicit drugs.   ROS:  Please see the history of present illness.   Denies neurological  complaints. Cath site is unremarkable.   All other systems reviewed and negative.   OBJECTIVE: VS:  BP 108/70  Pulse 73  Ht 5\' 11"  (1.803 m)  Wt 261 lb (118.389 kg)  BMI 36.42 kg/m2  SpO2 96% Well nourished, well developed, in no acute distress, moderate obesity HEENT: normal Neck: JVD flat. Carotid bruit absent  Cardiac:  normal S1, S2; RRR; no murmur Lungs:  clear to auscultation bilaterally, no wheezing, rhonchi or rales Abd: soft, nontender, no hepatomegaly Ext: Edema absent. Pulses 2+ Skin: warm and dry Neuro:  CNs 2-12 intact, no focal abnormalities noted  EKG:  Not repeated       Signed, Illene Labrador III, MD 07/05/2014 4:49 PM

## 2014-08-25 ENCOUNTER — Encounter (HOSPITAL_COMMUNITY): Payer: Self-pay | Admitting: Interventional Cardiology

## 2014-08-26 ENCOUNTER — Telehealth: Payer: Self-pay | Admitting: *Deleted

## 2014-08-26 NOTE — Telephone Encounter (Signed)
PA for Brilinta sent via CoverMyMeds. 

## 2014-08-29 NOTE — Telephone Encounter (Signed)
PA for Brilinta was denied. Will send information to Dr. Tamala Julian for further evaluation.

## 2014-08-31 NOTE — Telephone Encounter (Signed)
Switched to EFFIENT 10 MG DAILY. Please let me know if there will be a problem obtaining Effient.

## 2014-09-01 MED ORDER — PRASUGREL HCL 10 MG PO TABS: 10.0000 mg | ORAL_TABLET | Freq: Every day | ORAL | Status: DC

## 2014-09-01 NOTE — Telephone Encounter (Signed)
Pt aware Brilinta has been denied by his insurance. Dr.smith recommendas he be switched to Effients 10mg  daily. Pt aware an Rx has been sent to his pharmacy. He sholud complete his Brilinta supply and start Effient with no interrupption in therapy.pt verbalized understanding.

## 2014-09-13 NOTE — Telephone Encounter (Signed)
PA for Alec [name] has been approved through 09/13/2015. [id number]

## 2015-02-04 ENCOUNTER — Encounter (HOSPITAL_COMMUNITY): Payer: Self-pay | Admitting: *Deleted

## 2015-02-04 ENCOUNTER — Emergency Department (HOSPITAL_COMMUNITY)
Admission: EM | Admit: 2015-02-04 | Discharge: 2015-02-04 | Disposition: A | Discharge status: Home / Self Care | Payer: No Typology Code available for payment source | Attending: Emergency Medicine | Admitting: Emergency Medicine

## 2015-02-04 ENCOUNTER — Emergency Department (HOSPITAL_COMMUNITY): Payer: No Typology Code available for payment source

## 2015-02-04 DIAGNOSIS — E782 Mixed hyperlipidemia: Secondary | ICD-10-CM | POA: Diagnosis not present

## 2015-02-04 DIAGNOSIS — Z794 Long term (current) use of insulin: Secondary | ICD-10-CM | POA: Insufficient documentation

## 2015-02-04 DIAGNOSIS — Y9389 Activity, other specified: Secondary | ICD-10-CM | POA: Insufficient documentation

## 2015-02-04 DIAGNOSIS — M109 Gout, unspecified: Secondary | ICD-10-CM | POA: Diagnosis not present

## 2015-02-04 DIAGNOSIS — E663 Overweight: Secondary | ICD-10-CM | POA: Diagnosis not present

## 2015-02-04 DIAGNOSIS — Z79899 Other long term (current) drug therapy: Secondary | ICD-10-CM | POA: Diagnosis not present

## 2015-02-04 DIAGNOSIS — Z7982 Long term (current) use of aspirin: Secondary | ICD-10-CM | POA: Insufficient documentation

## 2015-02-04 DIAGNOSIS — M1990 Unspecified osteoarthritis, unspecified site: Secondary | ICD-10-CM | POA: Diagnosis not present

## 2015-02-04 DIAGNOSIS — S39012A Strain of muscle, fascia and tendon of lower back, initial encounter: Secondary | ICD-10-CM | POA: Diagnosis not present

## 2015-02-04 DIAGNOSIS — Y92410 Unspecified street and highway as the place of occurrence of the external cause: Secondary | ICD-10-CM | POA: Diagnosis not present

## 2015-02-04 DIAGNOSIS — F329 Major depressive disorder, single episode, unspecified: Secondary | ICD-10-CM | POA: Insufficient documentation

## 2015-02-04 DIAGNOSIS — G8929 Other chronic pain: Secondary | ICD-10-CM | POA: Insufficient documentation

## 2015-02-04 DIAGNOSIS — Z8719 Personal history of other diseases of the digestive system: Secondary | ICD-10-CM | POA: Insufficient documentation

## 2015-02-04 DIAGNOSIS — I2510 Atherosclerotic heart disease of native coronary artery without angina pectoris: Secondary | ICD-10-CM | POA: Diagnosis not present

## 2015-02-04 DIAGNOSIS — S199XXA Unspecified injury of neck, initial encounter: Secondary | ICD-10-CM | POA: Diagnosis present

## 2015-02-04 DIAGNOSIS — E119 Type 2 diabetes mellitus without complications: Secondary | ICD-10-CM | POA: Insufficient documentation

## 2015-02-04 DIAGNOSIS — S0990XA Unspecified injury of head, initial encounter: Secondary | ICD-10-CM | POA: Diagnosis not present

## 2015-02-04 DIAGNOSIS — Y998 Other external cause status: Secondary | ICD-10-CM | POA: Insufficient documentation

## 2015-02-04 DIAGNOSIS — S161XXA Strain of muscle, fascia and tendon at neck level, initial encounter: Secondary | ICD-10-CM | POA: Insufficient documentation

## 2015-02-04 MED ORDER — HYDROCODONE-ACETAMINOPHEN 5-325 MG PO TABS
1.0000 | ORAL_TABLET | Freq: Once | ORAL | Status: AC
  Administered 2015-02-04: 1 via ORAL
  Filled 2015-02-04: qty 1

## 2015-02-04 MED ORDER — HYDROCODONE-ACETAMINOPHEN 5-325 MG PO TABS: 1.0000 | ORAL_TABLET | Freq: Four times a day (QID) | ORAL | Status: DC | PRN

## 2015-02-04 MED ORDER — IBUPROFEN 600 MG PO TABS
600.0000 mg | ORAL_TABLET | Freq: Four times a day (QID) | ORAL | Status: DC | PRN
Start: 2015-02-04 — End: 2015-04-20

## 2015-02-04 MED ORDER — IBUPROFEN 400 MG PO TABS
600.0000 mg | ORAL_TABLET | Freq: Once | ORAL | Status: AC
  Administered 2015-02-04: 600 mg via ORAL
  Filled 2015-02-04 (×2): qty 1

## 2015-02-04 MED ORDER — ONDANSETRON 4 MG PO TBDP
4.0000 mg | ORAL_TABLET | Freq: Once | ORAL | Status: AC
  Administered 2015-02-04: 4 mg via ORAL
  Filled 2015-02-04: qty 1

## 2015-02-04 NOTE — Discharge Instructions (Signed)
Cervical Sprain A cervical sprain is an injury in the neck in which the strong, fibrous tissues (ligaments) that connect your neck bones stretch or tear. Cervical sprains can range from mild to severe. Severe cervical sprains can cause the neck vertebrae to be unstable. This can lead to damage of the spinal cord and can result in serious nervous system problems. The amount of time it takes for a cervical sprain to get better depends on the cause and extent of the injury. Most cervical sprains heal in 1 to 3 weeks. CAUSES  Severe cervical sprains may be caused by:   Contact sport injuries (such as from football, rugby, wrestling, hockey, auto racing, gymnastics, diving, martial arts, or boxing).   Motor vehicle collisions.   Whiplash injuries. This is an injury from a sudden forward and backward whipping movement of the head and neck.  Falls.  Mild cervical sprains may be caused by:   Being in an awkward position, such as while cradling a telephone between your ear and shoulder.   Sitting in a chair that does not offer proper support.   Working at a poorly Landscape architect station.   Looking up or down for long periods of time.  SYMPTOMS   Pain, soreness, stiffness, or a burning sensation in the front, back, or sides of the neck. This discomfort may develop immediately after the injury or slowly, 24 hours or more after the injury.   Pain or tenderness directly in the middle of the back of the neck.   Shoulder or upper back pain.   Limited ability to move the neck.   Headache.   Dizziness.   Weakness, numbness, or tingling in the hands or arms.   Muscle spasms.   Difficulty swallowing or chewing.   Tenderness and swelling of the neck.  DIAGNOSIS  Most of the time your health care provider can diagnose a cervical sprain by taking your history and doing a physical exam. Your health care provider will ask about previous neck injuries and any known neck  problems, such as arthritis in the neck. X-rays may be taken to find out if there are any other problems, such as with the bones of the neck. Other tests, such as a CT scan or MRI, may also be needed.  TREATMENT  Treatment depends on the severity of the cervical sprain. Mild sprains can be treated with rest, keeping the neck in place (immobilization), and pain medicines. Severe cervical sprains are immediately immobilized. Further treatment is done to help with pain, muscle spasms, and other symptoms and may include:  Medicines, such as pain relievers, numbing medicines, or muscle relaxants.   Physical therapy. This may involve stretching exercises, strengthening exercises, and posture training. Exercises and improved posture can help stabilize the neck, strengthen muscles, and help stop symptoms from returning.  HOME CARE INSTRUCTIONS   Put ice on the injured area.   Put ice in a plastic bag.   Place a towel between your skin and the bag.   Leave the ice on for 15-20 minutes, 3-4 times a day.   If your injury was severe, you may have been given a cervical collar to wear. A cervical collar is a two-piece collar designed to keep your neck from moving while it heals.  Do not remove the collar unless instructed by your health care provider.  If you have long hair, keep it outside of the collar.  Ask your health care provider before making any adjustments to your collar.  Minor adjustments may be required over time to improve comfort and reduce pressure on your chin or on the back of your head.  Ifyou are allowed to remove the collar for cleaning or bathing, follow your health care provider's instructions on how to do so safely.  Keep your collar clean by wiping it with mild soap and water and drying it completely. If the collar you have been given includes removable pads, remove them every 1-2 days and hand wash them with soap and water. Allow them to air dry. They should be completely  dry before you wear them in the collar.  If you are allowed to remove the collar for cleaning and bathing, wash and dry the skin of your neck. Check your skin for irritation or sores. If you see any, tell your health care provider.  Do not drive while wearing the collar.   Only take over-the-counter or prescription medicines for pain, discomfort, or fever as directed by your health care provider.   Keep all follow-up appointments as directed by your health care provider.   Keep all physical therapy appointments as directed by your health care provider.   Make any needed adjustments to your workstation to promote good posture.   Avoid positions and activities that make your symptoms worse.   Warm up and stretch before being active to help prevent problems.  SEEK MEDICAL CARE IF:   Your pain is not controlled with medicine.   You are unable to decrease your pain medicine over time as planned.   Your activity level is not improving as expected.  SEEK IMMEDIATE MEDICAL CARE IF:   You develop any bleeding.  You develop stomach upset.  You have signs of an allergic reaction to your medicine.   Your symptoms get worse.   You develop new, unexplained symptoms.   You have numbness, tingling, weakness, or paralysis in any part of your body.  MAKE SURE YOU:   Understand these instructions.  Will watch your condition.  Will get help right away if you are not doing well or get worse. Document Released: 06/30/2007 Document Revised: 09/07/2013 Document Reviewed: 03/10/2013 [facility] Patient Information 2015 [place], [place]. This information is not intended to replace advice given to you by your health care provider. Make sure you discuss any questions you have with your health care provider. Lumbosacral Strain Lumbosacral strain is a strain of any of the parts that make up your lumbosacral vertebrae. Your lumbosacral vertebrae are the bones that make up the lower third of  your backbone. Your lumbosacral vertebrae are held together by muscles and tough, fibrous tissue (ligaments).  CAUSES  A sudden blow to your back can cause lumbosacral strain. Also, anything that causes an excessive stretch of the muscles in the low back can cause this strain. This is typically seen when people exert themselves strenuously, fall, lift heavy objects, bend, or crouch repeatedly. RISK FACTORS  Physically demanding work.  Participation in pushing or pulling sports or sports that require a sudden twist of the back (tennis, golf, baseball).  Weight lifting.  Excessive lower back curvature.  Forward-tilted pelvis.  Weak back or abdominal muscles or both.  Tight hamstrings. SIGNS AND SYMPTOMS  Lumbosacral strain may cause pain in the area of your injury or pain that moves (radiates) down your leg.  DIAGNOSIS Your health care provider can often diagnose lumbosacral strain through a physical exam. In some cases, you may need tests such as X-ray exams.  TREATMENT  Treatment for your lower  back injury depends on many factors that your clinician will have to evaluate. However, most treatment will include the use of anti-inflammatory medicines. HOME CARE INSTRUCTIONS   Avoid hard physical activities (tennis, racquetball, waterskiing) if you are not in proper physical condition for it. This may aggravate or create problems.  If you have a back problem, avoid sports requiring sudden body movements. Swimming and walking are generally safer activities.  Maintain good posture.  Maintain a healthy weight.  For acute conditions, you may put ice on the injured area.  Put ice in a plastic bag.  Place a towel between your skin and the bag.  Leave the ice on for 20 minutes, 2-3 times a day.  When the low back starts healing, stretching and strengthening exercises may be recommended. SEEK MEDICAL CARE IF:  Your back pain is getting worse.  You experience severe back pain not  relieved with medicines. SEEK IMMEDIATE MEDICAL CARE IF:   You have numbness, tingling, weakness, or problems with the use of your arms or legs.  There is a change in bowel or bladder control.  You have increasing pain in any area of the body, including your belly (abdomen).  You notice shortness of breath, dizziness, or feel faint.  You feel sick to your stomach (nauseous), are throwing up (vomiting), or become sweaty.  You notice discoloration of your toes or legs, or your feet get very cold. MAKE SURE YOU:   Understand these instructions.  Will watch your condition.  Will get help right away if you are not doing well or get worse. Document Released: 06/12/2005 Document Revised: 09/07/2013 Document Reviewed: 04/21/2013 [facility] Patient Information 2015 [place], [place]. This information is not intended to replace advice given to you by your health care provider. Make sure you discuss any questions you have with your health care provider. Motor Vehicle Collision It is common to have multiple bruises and sore muscles after a motor vehicle collision (MVC). These tend to feel worse for the first 24 hours. You may have the most stiffness and soreness over the first several hours. You may also feel worse when you wake up the first morning after your collision. After this point, you will usually begin to improve with each day. The speed of improvement often depends on the severity of the collision, the number of injuries, and the location and nature of these injuries. HOME CARE INSTRUCTIONS  Put ice on the injured area.  Put ice in a plastic bag.  Place a towel between your skin and the bag.  Leave the ice on for 15-20 minutes, 3-4 times a day, or as directed by your health care provider.  Drink enough fluids to keep your urine clear or pale yellow. Do not drink alcohol.  Take a warm shower or bath once or twice a day. This will increase blood flow to sore muscles.  You may return to  activities as directed by your caregiver. Be careful when lifting, as this may aggravate neck or back pain.  Only take over-the-counter or prescription medicines for pain, discomfort, or fever as directed by your caregiver. Do not use aspirin. This may increase bruising and bleeding. SEEK IMMEDIATE MEDICAL CARE IF:  You have numbness, tingling, or weakness in the arms or legs.  You develop severe headaches not relieved with medicine.  You have severe neck pain, especially tenderness in the middle of the back of your neck.  You have changes in bowel or bladder control.  There is increasing pain  in any area of the body.  You have shortness of breath, light-headedness, dizziness, or fainting.  You have chest pain.  You feel sick to your stomach (nauseous), throw up (vomit), or sweat.  You have increasing abdominal discomfort.  There is blood in your urine, stool, or vomit.  You have pain in your shoulder (shoulder strap areas).  You feel your symptoms are getting worse. MAKE SURE YOU:  Understand these instructions.  Will watch your condition.  Will get help right away if you are not doing well or get worse. Document Released: 09/02/2005 Document Revised: 01/17/2014 Document Reviewed: 01/30/2011 [facility] Patient Information 2015 [place], [place]. This information is not intended to replace advice given to you by your health care provider. Make sure you discuss any questions you have with your health care provider.

## 2015-02-04 NOTE — ED Notes (Signed)
Pt states he is having to much pain on his head, neck and back no relief from pain medication given.
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
## 2015-02-04 NOTE — ED Notes (Signed)
Pt ambulated on the hallway with steady gait. Pt states pain increases when ambulating.

## 2015-03-13 ENCOUNTER — Other Ambulatory Visit: Payer: Self-pay

## 2015-03-28 ENCOUNTER — Other Ambulatory Visit: Payer: Self-pay | Admitting: Orthopaedic Surgery

## 2015-04-18 ENCOUNTER — Encounter (HOSPITAL_BASED_OUTPATIENT_CLINIC_OR_DEPARTMENT_OTHER): Payer: Self-pay | Admitting: *Deleted
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
## 2015-04-20 ENCOUNTER — Encounter (HOSPITAL_BASED_OUTPATIENT_CLINIC_OR_DEPARTMENT_OTHER)
Admission: RE | Admit: 2015-04-20 | Discharge: 2015-04-20 | Disposition: A | Discharge status: Home / Self Care | Payer: Commercial Managed Care - HMO | Source: Ambulatory Visit | Attending: Orthopaedic Surgery | Admitting: Orthopaedic Surgery

## 2015-04-20 ENCOUNTER — Encounter (HOSPITAL_BASED_OUTPATIENT_CLINIC_OR_DEPARTMENT_OTHER)
Admission: RE | Disposition: A | Discharge status: Home / Self Care | Payer: Self-pay | Source: Ambulatory Visit | Attending: Orthopaedic Surgery

## 2015-04-20 ENCOUNTER — Ambulatory Visit (HOSPITAL_BASED_OUTPATIENT_CLINIC_OR_DEPARTMENT_OTHER): Payer: Commercial Managed Care - HMO | Admitting: Certified Registered"

## 2015-04-20 ENCOUNTER — Ambulatory Visit (HOSPITAL_BASED_OUTPATIENT_CLINIC_OR_DEPARTMENT_OTHER)
Admission: RE | Admit: 2015-04-20 | Discharge: 2015-04-20 | Disposition: A | Discharge status: Home / Self Care | Payer: Commercial Managed Care - HMO | Source: Ambulatory Visit | Attending: Orthopaedic Surgery | Admitting: Orthopaedic Surgery

## 2015-04-20 ENCOUNTER — Encounter (HOSPITAL_BASED_OUTPATIENT_CLINIC_OR_DEPARTMENT_OTHER): Payer: Self-pay | Admitting: Certified Registered"

## 2015-04-20 DIAGNOSIS — E782 Mixed hyperlipidemia: Secondary | ICD-10-CM | POA: Insufficient documentation

## 2015-04-20 DIAGNOSIS — I2510 Atherosclerotic heart disease of native coronary artery without angina pectoris: Secondary | ICD-10-CM | POA: Insufficient documentation

## 2015-04-20 DIAGNOSIS — Z955 Presence of coronary angioplasty implant and graft: Secondary | ICD-10-CM | POA: Diagnosis not present

## 2015-04-20 DIAGNOSIS — X58XXXA Exposure to other specified factors, initial encounter: Secondary | ICD-10-CM | POA: Insufficient documentation

## 2015-04-20 DIAGNOSIS — M1712 Unilateral primary osteoarthritis, left knee: Secondary | ICD-10-CM | POA: Insufficient documentation

## 2015-04-20 DIAGNOSIS — Z9641 Presence of insulin pump (external) (internal): Secondary | ICD-10-CM | POA: Insufficient documentation

## 2015-04-20 DIAGNOSIS — Z794 Long term (current) use of insulin: Secondary | ICD-10-CM | POA: Insufficient documentation

## 2015-04-20 DIAGNOSIS — E119 Type 2 diabetes mellitus without complications: Secondary | ICD-10-CM | POA: Insufficient documentation

## 2015-04-20 DIAGNOSIS — S83242A Other tear of medial meniscus, current injury, left knee, initial encounter: Secondary | ICD-10-CM | POA: Diagnosis not present

## 2015-04-20 HISTORY — PX: CHONDROPLASTY: SHX5177

## 2015-04-20 HISTORY — PX: KNEE ARTHROSCOPY WITH MEDIAL MENISECTOMY: SHX5651

## 2015-04-20 LAB — BASIC METABOLIC PANEL
Anion gap: 10 (ref 5–15)
BUN: 20 mg/dL (ref 6–20)
CO2: 21 mmol/L — ABNORMAL LOW (ref 22–32)
Calcium: 9 mg/dL (ref 8.9–10.3)
Chloride: 105 mmol/L (ref 101–111)
Creatinine, Ser: 0.75 mg/dL (ref 0.61–1.24)
GFR calc Af Amer: >60 mL/min (ref >60)
GFR calc non Af Amer: >60 mL/min (ref >60)
Glucose, Bld: 167 mg/dL — ABNORMAL HIGH (ref 65–99)
Potassium: 7.2 mmol/L (ref 3.5–5.1)
Sodium: 136 mmol/L (ref 135–145)

## 2015-04-20 LAB — POCT I-STAT, CHEM 8
BUN: 29 mg/dL — ABNORMAL HIGH (ref 6–20)
Calcium, Ion: 1.13 mmol/L (ref 1.12–1.23)
Chloride: 109 mmol/L (ref 101–111)
Creatinine, Ser: 0.7 mg/dL (ref 0.61–1.24)
Glucose, Bld: 126 mg/dL — ABNORMAL HIGH (ref 65–99)
HCT: 50 % (ref 39.0–52.0)
Hemoglobin: 17 g/dL (ref 13.0–17.0)
Potassium: 4.3 mmol/L (ref 3.5–5.1)
Sodium: 137 mmol/L (ref 135–145)
TCO2: 23 mmol/L (ref 0–100)

## 2015-04-20 LAB — GLUCOSE, CAPILLARY: Glucose-Capillary: 109 mg/dL — ABNORMAL HIGH (ref 65–99)

## 2015-04-20 SURGERY — ARTHROSCOPY, KNEE, WITH MEDIAL MENISCECTOMY
Anesthesia: General | Site: Knee | Laterality: Left

## 2015-04-20 MED ORDER — SODIUM CHLORIDE 0.9 % IR SOLN
Status: DC | PRN
  Administered 2015-04-20: 6000 mL

## 2015-04-20 MED ORDER — SCOPOLAMINE 1 MG/3DAYS TD PT72: 1.0000 | MEDICATED_PATCH | Freq: Once | TRANSDERMAL | Status: DC | PRN

## 2015-04-20 MED ORDER — GLYCOPYRROLATE 0.2 MG/ML IJ SOLN: 0.2000 mg | Freq: Once | INTRAMUSCULAR | Status: DC | PRN

## 2015-04-20 MED ORDER — FENTANYL CITRATE (PF) 100 MCG/2ML IJ SOLN
50.0000 ug | INTRAMUSCULAR | Status: DC | PRN
  Administered 2015-04-20 (×2): 50 ug via INTRAVENOUS

## 2015-04-20 MED ORDER — CEFAZOLIN SODIUM-DEXTROSE 2-3 GM-% IV SOLR
INTRAVENOUS | Status: AC
  Filled 2015-04-20: qty 50

## 2015-04-20 MED ORDER — LIDOCAINE HCL (CARDIAC) 20 MG/ML IV SOLN
INTRAVENOUS | Status: DC | PRN
  Administered 2015-04-20: 80 mg via INTRAVENOUS

## 2015-04-20 MED ORDER — PROMETHAZINE HCL 25 MG/ML IJ SOLN: 6.2500 mg | INTRAMUSCULAR | Status: DC | PRN

## 2015-04-20 MED ORDER — MIDAZOLAM HCL 2 MG/2ML IJ SOLN
INTRAMUSCULAR | Status: AC
  Filled 2015-04-20: qty 2

## 2015-04-20 MED ORDER — MIDAZOLAM HCL 2 MG/2ML IJ SOLN
1.0000 mg | INTRAMUSCULAR | Status: DC | PRN
  Administered 2015-04-20: 1 mg via INTRAVENOUS

## 2015-04-20 MED ORDER — FENTANYL CITRATE (PF) 100 MCG/2ML IJ SOLN
INTRAMUSCULAR | Status: AC
  Filled 2015-04-20: qty 6

## 2015-04-20 MED ORDER — FENTANYL CITRATE (PF) 100 MCG/2ML IJ SOLN
INTRAMUSCULAR | Status: AC
  Filled 2015-04-20: qty 2

## 2015-04-20 MED ORDER — FENTANYL CITRATE (PF) 100 MCG/2ML IJ SOLN
25.0000 ug | INTRAMUSCULAR | Status: DC | PRN
  Administered 2015-04-20 (×2): 50 ug via INTRAVENOUS
  Administered 2015-04-20: 25 ug via INTRAVENOUS
  Administered 2015-04-20: 50 ug via INTRAVENOUS
  Administered 2015-04-20: 25 ug via INTRAVENOUS

## 2015-04-20 MED ORDER — HYDROCODONE-ACETAMINOPHEN 5-325 MG PO TABS: 1.0000 | ORAL_TABLET | Freq: Four times a day (QID) | ORAL | Status: DC | PRN

## 2015-04-20 MED ORDER — LACTATED RINGERS IV SOLN: INTRAVENOUS | Status: DC

## 2015-04-20 MED ORDER — METHYLPREDNISOLONE ACETATE 80 MG/ML IJ SUSP
INTRAMUSCULAR | Status: AC
  Filled 2015-04-20: qty 1

## 2015-04-20 MED ORDER — BUPIVACAINE-EPINEPHRINE 0.5% -1:200000 IJ SOLN
INTRAMUSCULAR | Status: DC | PRN
  Administered 2015-04-20: 20 mL

## 2015-04-20 MED ORDER — CHLORHEXIDINE GLUCONATE 4 % EX LIQD: 60.0000 mL | Freq: Once | CUTANEOUS | Status: DC

## 2015-04-20 MED ORDER — LACTATED RINGERS IV SOLN
INTRAVENOUS | Status: DC
  Administered 2015-04-20: 13:00:00 via INTRAVENOUS

## 2015-04-20 MED ORDER — CEFAZOLIN SODIUM-DEXTROSE 2-3 GM-% IV SOLR
2.0000 g | INTRAVENOUS | Status: AC
  Administered 2015-04-20: 2 g via INTRAVENOUS

## 2015-04-20 MED ORDER — METHYLPREDNISOLONE ACETATE 40 MG/ML IJ SUSP
INTRAMUSCULAR | Status: DC | PRN
  Administered 2015-04-20: 80 mg

## 2015-04-20 MED ORDER — ONDANSETRON HCL 4 MG/2ML IJ SOLN
INTRAMUSCULAR | Status: DC | PRN
  Administered 2015-04-20: 4 mg via INTRAVENOUS

## 2015-04-20 MED ORDER — BUPIVACAINE-EPINEPHRINE (PF) 0.5% -1:200000 IJ SOLN
INTRAMUSCULAR | Status: AC
Start: 2015-04-20 — End: 2015-04-20
  Filled 2015-04-20: qty 30

## 2015-04-20 MED ORDER — MORPHINE SULFATE 4 MG/ML IJ SOLN
INTRAMUSCULAR | Status: DC | PRN
  Administered 2015-04-20: 4 mg via INTRAVENOUS

## 2015-04-20 MED ORDER — PROPOFOL 10 MG/ML IV BOLUS
INTRAVENOUS | Status: DC | PRN
  Administered 2015-04-20: 270 mg via INTRAVENOUS

## 2015-04-20 MED ORDER — MORPHINE SULFATE 4 MG/ML IJ SOLN
INTRAMUSCULAR | Status: AC
  Filled 2015-04-20: qty 1

## 2015-04-20 SURGICAL SUPPLY — 36 items
BANDAGE ELASTIC 6 VELCRO ST LF (GAUZE/BANDAGES/DRESSINGS) ×5 IMPLANT
BLADE CUDA 5.5 (BLADE) IMPLANT
BLADE CUTTER GATOR 3.5 (BLADE) ×5 IMPLANT
BLADE GREAT WHITE 4.2 (BLADE) IMPLANT
BLADE GREAT WHITE 4.2MM (BLADE)
BNDG GAUZE ELAST 4 BULKY (GAUZE/BANDAGES/DRESSINGS) ×5 IMPLANT
DRAPE ARTHROSCOPY W/POUCH 114 (DRAPES) ×5 IMPLANT
DRAPE U-SHAPE 47X51 STRL (DRAPES) ×5 IMPLANT
DRSG EMULSION OIL 3X3 NADH (GAUZE/BANDAGES/DRESSINGS) ×5 IMPLANT
DURAPREP 26ML APPLICATOR (WOUND CARE) ×5 IMPLANT
ELECT MENISCUS 165MM 90D (ELECTRODE) IMPLANT
ELECT REM PT RETURN 9FT ADLT (ELECTROSURGICAL) ×5
ELECTRODE REM PT RTRN 9FT ADLT (ELECTROSURGICAL) ×1 IMPLANT
GAUZE SPONGE 4X4 12PLY STRL (GAUZE/BANDAGES/DRESSINGS) ×5 IMPLANT
GLOVE BIO SURGEON STRL SZ8 (GLOVE) ×10 IMPLANT
GLOVE BIOGEL PI IND STRL 8 (GLOVE) ×6 IMPLANT
GLOVE BIOGEL PI INDICATOR 8 (GLOVE) ×4
GOWN STRL REUS W/ TWL LRG LVL3 (GOWN DISPOSABLE) ×3 IMPLANT
GOWN STRL REUS W/ TWL XL LVL3 (GOWN DISPOSABLE) ×6 IMPLANT
GOWN STRL REUS W/TWL LRG LVL3 (GOWN DISPOSABLE) ×5
GOWN STRL REUS W/TWL XL LVL3 (GOWN DISPOSABLE) ×10
IV NS IRRIG 3000ML ARTHROMATIC (IV SOLUTION) IMPLANT
KNEE WRAP E Z 3 GEL PACK (MISCELLANEOUS) ×5 IMPLANT
MANIFOLD NEPTUNE II (INSTRUMENTS) ×3 IMPLANT
PACK ARTHROSCOPY DSU (CUSTOM PROCEDURE TRAY) ×5 IMPLANT
PACK BASIN DAY SURGERY FS (CUSTOM PROCEDURE TRAY) ×5 IMPLANT
PENCIL BUTTON HOLSTER BLD 10FT (ELECTRODE) IMPLANT
SET ARTHROSCOPY TUBING (MISCELLANEOUS) ×5
SET ARTHROSCOPY TUBING LN (MISCELLANEOUS) ×3 IMPLANT
SHEET MEDIUM DRAPE 40X70 STRL (DRAPES) ×5 IMPLANT
SYR 3ML 18GX1 1/2 (SYRINGE) IMPLANT
TOWEL OR 17X24 6PK STRL BLUE (TOWEL DISPOSABLE) ×5 IMPLANT
TOWEL OR NON WOVEN STRL DISP B (DISPOSABLE) ×5 IMPLANT
WAND 30 DEG SABER W/CORD (SURGICAL WAND) IMPLANT
WAND STAR VAC 90 (SURGICAL WAND) IMPLANT
WATER STERILE IRR 1000ML POUR (IV SOLUTION) ×5 IMPLANT

## 2015-04-20 NOTE — Transfer of Care (Signed)
Immediate Anesthesia Transfer of Care Note  Patient: Alec [name]  Procedure(s) Performed: Procedure(s) with comments: LEFT ARTHROSCOPY KNEE (Left) - Partial medial menisectomy, chondroplasty.  Patient Location: PACU  Anesthesia Type:General  Level of Consciousness: awake, alert  and oriented  Airway & Oxygen Therapy: Patient Spontanous Breathing and Patient connected to face mask oxygen  Post-op Assessment: Report given to RN, Post -op Vital signs reviewed and stable and Patient moving all extremities  Post vital signs: Reviewed and stable  Last Vitals:  Filed Vitals:   04/20/15 1218  BP: 151/81  Pulse: 82  Temp: 36.8 C  Resp: 18    Complications: No apparent anesthesia complications

## 2015-04-20 NOTE — Anesthesia Postprocedure Evaluation (Signed)
  Anesthesia Post-op Note  Patient: Alec [name]  Procedure(s) Performed: Procedure(s) with comments: LEFT ARTHROSCOPY KNEE (Left) - Partial medial menisectomy, chondroplasty.  Patient Location: PACU  Anesthesia Type:General  Level of Consciousness: awake  Airway and Oxygen Therapy: Patient Spontanous Breathing  Post-op Pain: mild  Post-op Assessment: Post-op Vital signs reviewed LLE Motor Response: Purposeful movement LLE Sensation: Numbness          Post-op Vital Signs: Reviewed  Last Vitals:  Filed Vitals:   04/20/15 1612  BP: 137/78  Pulse: 66  Temp: 36.5 C  Resp: 18    Complications: No apparent anesthesia complications

## 2015-04-20 NOTE — Op Note (Signed)
#  [id number] 

## 2015-04-20 NOTE — Anesthesia Preprocedure Evaluation (Addendum)
Anesthesia Evaluation  Patient identified by MRN, date of birth, ID band Patient awake    Reviewed: Allergy & Precautions, NPO status , reviewed documented beta blocker date and time   Airway Mallampati: II  TM Distance: >3 FB Neck ROM: Full    Dental   Pulmonary neg pulmonary ROS,  breath sounds clear to auscultation        Cardiovascular + angina + CAD Rhythm:Regular Rate:Normal     Neuro/Psych    GI/Hepatic negative GI ROS, Neg liver ROS,   Endo/Other  diabetes  Renal/GU      Musculoskeletal   Abdominal   Peds  Hematology   Anesthesia Other Findings   Reproductive/Obstetrics                            Anesthesia Physical Anesthesia Plan  ASA: III  Anesthesia Plan: General   Post-op Pain Management:    Induction: Intravenous  Airway Management Planned: LMA  Additional Equipment:   Intra-op Plan:   Post-operative Plan: Extubation in OR  Informed Consent: I have reviewed the patients History and Physical, chart, labs and discussed the procedure including the risks, benefits and alternatives for the proposed anesthesia with the patient or authorized representative who has indicated his/her understanding and acceptance.   Dental advisory given  Plan Discussed with: CRNA and Anesthesiologist  Anesthesia Plan Comments:         Anesthesia Quick Evaluation

## 2015-04-20 NOTE — Interval H&P Note (Signed)
OK for surgery PD 

## 2015-04-20 NOTE — Anesthesia Procedure Notes (Signed)
Procedure Name: LMA Insertion Date/Time: 04/20/2015 1:42 PM Performed by: Baxter Flattery Pre-anesthesia Checklist: Patient identified, Emergency Drugs available, Suction available and Patient being monitored Patient Re-evaluated:Patient Re-evaluated prior to inductionOxygen Delivery Method: Circle System Utilized Preoxygenation: Pre-oxygenation with 100% oxygen Intubation Type: IV induction Ventilation: Mask ventilation without difficulty LMA: LMA inserted LMA Size: 5.0 Number of attempts: 1 Airway Equipment and Method: Bite block Placement Confirmation: positive ETCO2 and breath sounds checked- equal and bilateral Tube secured with: Tape Dental Injury: Teeth and Oropharynx as per pre-operative assessment

## 2015-04-20 NOTE — Discharge Instructions (Signed)

## 2015-04-21 ENCOUNTER — Encounter (HOSPITAL_BASED_OUTPATIENT_CLINIC_OR_DEPARTMENT_OTHER): Payer: Self-pay | Admitting: Orthopaedic Surgery
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
## 2015-04-27 ENCOUNTER — Encounter (HOSPITAL_BASED_OUTPATIENT_CLINIC_OR_DEPARTMENT_OTHER): Payer: Self-pay | Admitting: Orthopaedic Surgery

## 2015-05-04 ENCOUNTER — Other Ambulatory Visit: Payer: Self-pay | Admitting: Physician Assistant

## 2015-05-04 NOTE — Telephone Encounter (Signed)
Please advise on refill.

## 2015-05-05 NOTE — Telephone Encounter (Signed)
Ok to refill fenofibrate for a minimum amount, pt needs to schedule an appt. Allopurinol refill rqst should go to pt pcp.

## 2015-06-23 ENCOUNTER — Other Ambulatory Visit: Payer: Self-pay | Admitting: Physician Assistant

## 2015-07-28 ENCOUNTER — Other Ambulatory Visit: Payer: Self-pay | Admitting: Interventional Cardiology

## 2015-08-11 ENCOUNTER — Emergency Department (HOSPITAL_BASED_OUTPATIENT_CLINIC_OR_DEPARTMENT_OTHER): Payer: Commercial Managed Care - HMO

## 2015-08-11 ENCOUNTER — Inpatient Hospital Stay (HOSPITAL_BASED_OUTPATIENT_CLINIC_OR_DEPARTMENT_OTHER)
Admission: EM | Admit: 2015-08-11 | Discharge: 2015-08-14 | DRG: 287 | Disposition: A | Discharge status: Home / Self Care | Payer: Commercial Managed Care - HMO | Attending: Cardiovascular Disease | Admitting: Cardiovascular Disease

## 2015-08-11 ENCOUNTER — Encounter (HOSPITAL_BASED_OUTPATIENT_CLINIC_OR_DEPARTMENT_OTHER): Payer: Self-pay

## 2015-08-11 DIAGNOSIS — E8881 Metabolic syndrome: Secondary | ICD-10-CM | POA: Diagnosis present

## 2015-08-11 DIAGNOSIS — M109 Gout, unspecified: Secondary | ICD-10-CM | POA: Diagnosis present

## 2015-08-11 DIAGNOSIS — R0789 Other chest pain: Secondary | ICD-10-CM | POA: Diagnosis not present

## 2015-08-11 DIAGNOSIS — I200 Unstable angina: Secondary | ICD-10-CM | POA: Diagnosis present

## 2015-08-11 DIAGNOSIS — Z8249 Family history of ischemic heart disease and other diseases of the circulatory system: Secondary | ICD-10-CM

## 2015-08-11 DIAGNOSIS — M1990 Unspecified osteoarthritis, unspecified site: Secondary | ICD-10-CM | POA: Diagnosis present

## 2015-08-11 DIAGNOSIS — Z9641 Presence of insulin pump (external) (internal): Secondary | ICD-10-CM | POA: Diagnosis present

## 2015-08-11 DIAGNOSIS — R079 Chest pain, unspecified: Secondary | ICD-10-CM | POA: Diagnosis not present

## 2015-08-11 DIAGNOSIS — G8929 Other chronic pain: Secondary | ICD-10-CM | POA: Diagnosis present

## 2015-08-11 DIAGNOSIS — Z794 Long term (current) use of insulin: Secondary | ICD-10-CM

## 2015-08-11 DIAGNOSIS — I2510 Atherosclerotic heart disease of native coronary artery without angina pectoris: Secondary | ICD-10-CM | POA: Diagnosis present

## 2015-08-11 DIAGNOSIS — E119 Type 2 diabetes mellitus without complications: Secondary | ICD-10-CM | POA: Insufficient documentation

## 2015-08-11 DIAGNOSIS — F329 Major depressive disorder, single episode, unspecified: Secondary | ICD-10-CM | POA: Diagnosis present

## 2015-08-11 DIAGNOSIS — E781 Pure hyperglyceridemia: Secondary | ICD-10-CM | POA: Diagnosis present

## 2015-08-11 DIAGNOSIS — Z7902 Long term (current) use of antithrombotics/antiplatelets: Secondary | ICD-10-CM

## 2015-08-11 DIAGNOSIS — Z955 Presence of coronary angioplasty implant and graft: Secondary | ICD-10-CM

## 2015-08-11 DIAGNOSIS — Z7982 Long term (current) use of aspirin: Secondary | ICD-10-CM

## 2015-08-11 DIAGNOSIS — F32A Depression, unspecified: Secondary | ICD-10-CM | POA: Diagnosis present

## 2015-08-11 DIAGNOSIS — Z885 Allergy status to narcotic agent status: Secondary | ICD-10-CM

## 2015-08-11 DIAGNOSIS — E782 Mixed hyperlipidemia: Secondary | ICD-10-CM | POA: Diagnosis present

## 2015-08-11 DIAGNOSIS — M545 Low back pain: Secondary | ICD-10-CM | POA: Diagnosis present

## 2015-08-11 LAB — COMPREHENSIVE METABOLIC PANEL
ALT: 17 U/L (ref 17–63)
AST: 21 U/L (ref 15–41)
Albumin: 3.5 g/dL (ref 3.5–5.0)
Alkaline Phosphatase: 75 U/L (ref 38–126)
Anion gap: 7 (ref 5–15)
BUN: 16 mg/dL (ref 6–20)
CO2: 26 mmol/L (ref 22–32)
Calcium: 8.9 mg/dL (ref 8.9–10.3)
Chloride: 101 mmol/L (ref 101–111)
Creatinine, Ser: 0.65 mg/dL (ref 0.61–1.24)
GFR calc Af Amer: >60 mL/min (ref >60)
GFR calc non Af Amer: >60 mL/min (ref >60)
Glucose, Bld: 180 mg/dL — ABNORMAL HIGH (ref 65–99)
Potassium: 3.9 mmol/L (ref 3.5–5.1)
Sodium: 134 mmol/L — ABNORMAL LOW (ref 135–145)
Total Bilirubin: 0.4 mg/dL (ref 0.3–1.2)
Total Protein: 6.7 g/dL (ref 6.5–8.1)

## 2015-08-11 LAB — CBC
HCT: 43.8 % (ref 39.0–52.0)
Hemoglobin: 15.8 g/dL (ref 13.0–17.0)
MCH: 31.2 pg (ref 26.0–34.0)
MCHC: 36.1 g/dL — ABNORMAL HIGH (ref 30.0–36.0)
MCV: 86.4 fL (ref 78.0–100.0)
Platelets: 208 10*3/uL (ref 150–400)
RBC: 5.07 MIL/uL (ref 4.22–5.81)
RDW: 13.8 % (ref 11.5–15.5)
WBC: 8.5 10*3/uL (ref 4.0–10.5)

## 2015-08-11 LAB — TROPONIN I: Troponin I: <0.03 ng/mL (ref <0.031)

## 2015-08-11 MED ORDER — NITROGLYCERIN 0.4 MG SL SUBL
SUBLINGUAL_TABLET | SUBLINGUAL | Status: AC
  Administered 2015-08-11: 0.4 mg via SUBLINGUAL
  Filled 2015-08-11: qty 3

## 2015-08-11 MED ORDER — NITROGLYCERIN 0.4 MG SL SUBL
0.4000 mg | SUBLINGUAL_TABLET | SUBLINGUAL | Status: DC | PRN
  Administered 2015-08-11 (×3): 0.4 mg via SUBLINGUAL

## 2015-08-11 MED ORDER — ASPIRIN 81 MG PO CHEW
324.0000 mg | CHEWABLE_TABLET | Freq: Once | ORAL | Status: AC
Start: 2015-08-11 — End: 2015-08-12
  Administered 2015-08-11: 324 mg via ORAL

## 2015-08-11 MED ORDER — ASPIRIN 81 MG PO CHEW
CHEWABLE_TABLET | ORAL | Status: AC
  Filled 2015-08-11: qty 4

## 2015-08-11 MED ORDER — MORPHINE SULFATE (PF) 2 MG/ML IV SOLN
2.0000 mg | Freq: Once | INTRAVENOUS | Status: AC
  Administered 2015-08-11: 2 mg via INTRAVENOUS
  Filled 2015-08-11: qty 1

## 2015-08-11 NOTE — ED Notes (Signed)
MD at bedside. 

## 2015-08-11 NOTE — ED Notes (Signed)
CP x 1.5 hrs-NTG x 1 with no relief PTA

## 2015-08-11 NOTE — ED Provider Notes (Signed)
CSN: [id number]     Arrival date & time 08/11/15  2242 History  By signing my name below, I, Alec [name], attest that this documentation has been prepared under the direction and in the presence of [name], MD. Electronically Signed: Helane [name], ED Scribe. 08/11/2015. 11:38 PM.    Chief Complaint  Patient presents with  . Chest Pain   The history is provided by the patient. No language interpreter was used.   HPI Comments: Alec [name] is a 54 y.o. male who presents to the Emergency Department complaining of waxing and waning right upper chest pain, radiating down the right arm onset 1.5 hours ago. He rates his pain at an 8/10 at its worst, and a 6/10 currently. Pt states he woke up with diaphoresis, HA, and chest pain. He notes he took one NTG which resolved the HA. He reports associated SOB. He notes that this feels like his previous episode of angina. Pt has a PMHx of right coronary artery blockage, and clean left coronary arteries. He notes he is currently on Effient and takes aspirin daily, but did not take aspirin today. Per wife, pt had normal labs and EKG during his last episode. She notes pt has 2 stents. Pt denies n/v.   Past Medical History  Diagnosis Date  . Mixed hyperlipidemia   . Metabolic syndrome   . History of pancreatitis   . Arthritis   . Chronic lower back pain   . Gout   . Depression   . History of echocardiogram     a. 04/27/14 with EF 65-70% and mild TR.  . Type 2 diabetes mellitus (HCC)     on Insulin pump  . CAD (coronary artery disease)     a. 04/27/14 DES to dRCA; widely patent L coronary system   Past Surgical History  Procedure Laterality Date  . Carpal tunnel release Bilateral 2006-2008    left 12/2004; right 10/2006  . Shoulder arthroscopy Right ~ 2010  . Back surgery    . Knee arthroscopy Bilateral 2000's    right 08/2003;   . Posterior lumbar fusion  07/2009  . Lumbar laminectomy Left 05/2006  . Functional endoscopic sinus surgery   10/2001; 08/2005;   . Left heart catheterization with coronary angiogram N/A 04/27/2014    Procedure: LEFT HEART CATHETERIZATION WITH CORONARY ANGIOGRAM;  Surgeon: [name], MD;  Location: [facility] CATH LAB;  Service: Cardiovascular;  Laterality: N/A;  . Percutaneous stent intervention  04/27/2014    Procedure: PERCUTANEOUS STENT INTERVENTION;  Surgeon: [name], MD;  Location: [facility] CATH LAB;  Service: Cardiovascular;;  DES x2 (distal +prox RCA)  . Knee arthroscopy with medial menisectomy Left 04/20/2015    Procedure: LEFT ARTHROSCOPY KNEE WITH MEDIAL MENISECTOMY;  Surgeon: Melrose Nakayama, MD;  Location: [facility];  Service: Orthopedics;  Laterality: Left;  Partial medial menisectomy, chondroplasty.  . Chondroplasty  04/20/2015    Procedure: CHONDROPLASTY;  Surgeon: Melrose Nakayama, MD;  Location: [facility];  Service: Orthopedics;;   Family History  Problem Relation Age of Onset  . Coronary artery disease Mother     MI in her 62's  . Heart attack    . Heart attack Mother    Social History  Substance Use Topics  . Smoking status: Never Smoker   . Smokeless tobacco: Never Used  . Alcohol Use: Yes     Comment: occ    Review of Systems  All other systems reviewed and are  negative.   Allergies  Percocet  Home Medications   Prior to Admission medications   Medication Sig Start Date End Date Taking? Authorizing Provider  allopurinol (ZYLOPRIM) 300 MG tablet Take 1 tablet (300 mg total) by mouth daily. 04/28/14 11/01/16  Eileen Stanford, PA-C  aspirin EC 81 MG tablet Take 81 mg by mouth daily.    Historical Provider, MD  Atorvastatin Calcium (LIPITOR PO) Take 1 tablet by mouth daily.    Historical Provider, MD  BAYER CONTOUR TEST test strip 1 each by Other route as needed.  05/02/14   Historical Provider, MD  cetirizine (ZYRTEC) 10 MG tablet Take 10 mg by mouth daily.    Historical Provider, MD  Empagliflozin (JARDIANCE) 25 MG TABS Take 25 mg by  mouth daily.    Historical Provider, MD  escitalopram (LEXAPRO) 20 MG tablet TAKE 1 TABLET (20 MG) BY MOUTH DAILY. 06/23/15   Belva Crome, MD  fenofibrate 160 MG tablet TAKE 1 TABLET (160 MG) BY MOUTH DAILY. *PT NEEDS TO CONTACT OFFICE TO SCHEDULE APPT* 07/31/15   Belva Crome, MD  HYDROcodone-acetaminophen (NORCO/VICODIN) 5-325 MG per tablet Take 1-2 tablets by mouth every 6 (six) hours as needed for moderate pain. 04/20/15   Loni Dolly, PA-C  insulin regular human CONCENTRATED (HUMULIN R) 500 UNIT/ML SOLN injection Inject 30 Units into the skin 2 (two) times daily. 1.25 units per hour, bolus as needed    Historical Provider, MD  nitroGLYCERIN (NITROSTAT) 0.4 MG SL tablet Place 1 tablet (0.4 mg total) under the tongue every 5 (five) minutes x 3 doses as needed for chest pain. 04/28/14   Eileen Stanford, PA-C  prasugrel (EFFIENT) 10 MG TABS tablet Take 1 tablet (10 mg total) by mouth daily. 09/01/14   Belva Crome, MD  VASCEPA 1 G CAPS Take 4 g by mouth daily. 03/03/14   Historical Provider, MD   BP 136/80 mmHg  Pulse 73  Temp(Src) 98.6 F (37 C) (Oral)  Resp 15  Ht 5\' 11"  (1.803 m)  Wt 255 lb (115.667 kg)  BMI 35.58 kg/m2  SpO2 95%   Physical Exam General: Well-developed, well-nourished male in no acute distress; appearance consistent with age of record HENT: normocephalic; atraumatic Eyes: pupils equal, round and reactive to light; extraocular muscles intact Neck: supple Heart: regular rate and rhythm; no murmurs, rubs or gallops Lungs: clear to auscultation bilaterally Abdomen: soft; nondistended; nontender; no masses or hepatosplenomegaly; bowel sounds present Extremities: No deformity; full range of motion; pulses normal Neurologic: Awake, alert and oriented; motor function intact in all extremities and symmetric; no facial droop Skin: Warm and dry Psychiatric: Normal mood and affect   ED Course  Procedures   CRITICAL CARE Performed by: Ezra Denne L Total critical care  time: 30 minutes Critical care time was exclusive of separately billable procedures and treating other patients. Critical care was necessary to treat or prevent imminent or life-threatening deterioration. Critical care was time spent personally by me on the following activities: development of treatment plan with patient and/or surrogate as well as nursing, discussions with consultants, evaluation of patient's response to treatment, examination of patient, obtaining history from patient or surrogate, ordering and performing treatments and interventions, ordering and review of laboratory studies, ordering and review of radiographic studies, pulse oximetry and re-evaluation of patient's condition.   MDM  Nursing notes and vitals signs, including pulse oximetry, reviewed.  Summary of this visit's results, reviewed by myself:   EKG Interpretation  Date/Time:  Friday August 11 2015 23:47:07 EST Ventricular Rate:  78 PR Interval:  162 QRS Duration: 84 QT Interval:  372 QTC Calculation: 424 R Axis:   52 Text Interpretation:  Right-Sided EKG Normal sinus rhythm Lateral infarct , age undetermined Abnormal ECG No significant change was found Confirmed by Florina Ou  MD, Jenny Reichmann ([id number]) on 08/11/2015 11:49:25 PM       EKG Interpretation  Date/Time:  Friday August 11 2015 23:47:07 EST Ventricular Rate:  78 PR Interval:  162 QRS Duration: 84 QT Interval:  372 QTC Calculation: 424 R Axis:   52 Text Interpretation:  Right-Sided EKG Normal sinus rhythm Lateral infarct , age undetermined Abnormal ECG No significant change was found Confirmed by Florina Ou  MD, Jenny Reichmann ([id number]) on 08/11/2015 11:49:25 PM       EKG Interpretation  Date/Time:  Friday August 11 2015 23:47:07 EST Ventricular Rate:  78 PR Interval:  162 QRS Duration: 84 QT Interval:  372 QTC Calculation: 424 R Axis:   52 Text Interpretation:  Right-Sided EKG Normal sinus rhythm Lateral infarct , age undetermined Abnormal ECG No  significant change was found Confirmed by Tilden Broz  MD, Jenny Reichmann ([id number]) on 08/11/2015 11:49:25 PM        Labs:  Results for orders placed or performed during the hospital encounter of 08/11/15 (from the past 24 hour(s))  Troponin I     Status: None   Collection Time: 08/11/15 11:00 PM  Result Value Ref Range   Troponin I <0.03 <0.031 ng/mL  CBC     Status: Abnormal   Collection Time: 08/11/15 11:00 PM  Result Value Ref Range   WBC 8.5 4.0 - 10.5 K/uL   RBC 5.07 4.22 - 5.81 MIL/uL   Hemoglobin 15.8 13.0 - 17.0 g/dL   HCT 43.8 39.0 - 52.0 %   MCV 86.4 78.0 - 100.0 fL   MCH 31.2 26.0 - 34.0 pg   MCHC 36.1 (H) 30.0 - 36.0 g/dL   RDW 13.8 11.5 - 15.5 %   Platelets 208 150 - 400 K/uL  Comprehensive metabolic panel     Status: Abnormal   Collection Time: 08/11/15 11:00 PM  Result Value Ref Range   Sodium 134 (L) 135 - 145 mmol/L   Potassium 3.9 3.5 - 5.1 mmol/L   Chloride 101 101 - 111 mmol/L   CO2 26 22 - 32 mmol/L   Glucose, Bld 180 (H) 65 - 99 mg/dL   BUN 16 6 - 20 mg/dL   Creatinine, Ser 0.65 0.61 - 1.24 mg/dL   Calcium 8.9 8.9 - 10.3 mg/dL   Total Protein 6.7 6.5 - 8.1 g/dL   Albumin 3.5 3.5 - 5.0 g/dL   AST 21 15 - 41 U/L   ALT 17 17 - 63 U/L   Alkaline Phosphatase 75 38 - 126 U/L   Total Bilirubin 0.4 0.3 - 1.2 mg/dL   GFR calc non Af Amer >60 >60 mL/min   GFR calc Af Amer >60 >60 mL/min   Anion gap 7 5 - 15  Troponin I     Status: None   Collection Time: 08/12/15  2:30 AM  Result Value Ref Range   Troponin I <0.03 <0.031 ng/mL    Imaging Studies: Dg Chest 2 View  08/11/2015  CLINICAL DATA:  Chest pain EXAM: CHEST  2 VIEW COMPARISON:  04/26/2014 FINDINGS: Cardiomediastinal silhouette is stable. Mild elevation of the right hemidiaphragm again noted. No acute infiltrate or pleural effusion. No pulmonary edema. Stable degenerative changes thoracic spine. IMPRESSION: No active  cardiopulmonary disease. Electronically Signed   By: Lahoma Crocker M.D.   On: 08/11/2015 23:58      11:23 PM Pain 4/10 after NTG x 3. Morphine 2mg  IV ordered.  12:30 AM Discussed with Dr. Rosanna Randy of cardiology. She advises we start a nitroglycerin drip on the patient. We will transfer him to [facility] for further evaluation.  2:26 AM Patient still having intermittent pain. Awaiting [facility] transport.  3:32 AM Troponin negative 2. Patient still having 4 out of 10 pain. [facility] at bedside.   I personally performed the services described in this documentation, which was scribed in my presence. The recorded information has been reviewed and is accurate.   [name], MD 08/12/15 [phone]

## 2015-08-12 ENCOUNTER — Inpatient Hospital Stay (HOSPITAL_COMMUNITY): Payer: Commercial Managed Care - HMO

## 2015-08-12 ENCOUNTER — Encounter (HOSPITAL_COMMUNITY): Payer: Self-pay | Admitting: Internal Medicine

## 2015-08-12 DIAGNOSIS — E8881 Metabolic syndrome: Secondary | ICD-10-CM | POA: Diagnosis not present

## 2015-08-12 DIAGNOSIS — I2510 Atherosclerotic heart disease of native coronary artery without angina pectoris: Secondary | ICD-10-CM | POA: Diagnosis not present

## 2015-08-12 DIAGNOSIS — E781 Pure hyperglyceridemia: Secondary | ICD-10-CM | POA: Diagnosis present

## 2015-08-12 DIAGNOSIS — R079 Chest pain, unspecified: Secondary | ICD-10-CM | POA: Diagnosis present

## 2015-08-12 DIAGNOSIS — M109 Gout, unspecified: Secondary | ICD-10-CM

## 2015-08-12 DIAGNOSIS — E782 Mixed hyperlipidemia: Secondary | ICD-10-CM | POA: Diagnosis present

## 2015-08-12 DIAGNOSIS — Z9641 Presence of insulin pump (external) (internal): Secondary | ICD-10-CM | POA: Diagnosis present

## 2015-08-12 DIAGNOSIS — Z885 Allergy status to narcotic agent status: Secondary | ICD-10-CM | POA: Diagnosis not present

## 2015-08-12 DIAGNOSIS — Z7902 Long term (current) use of antithrombotics/antiplatelets: Secondary | ICD-10-CM | POA: Diagnosis not present

## 2015-08-12 DIAGNOSIS — Z955 Presence of coronary angioplasty implant and graft: Secondary | ICD-10-CM | POA: Diagnosis not present

## 2015-08-12 DIAGNOSIS — I25118 Atherosclerotic heart disease of native coronary artery with other forms of angina pectoris: Secondary | ICD-10-CM | POA: Diagnosis not present

## 2015-08-12 DIAGNOSIS — Z7982 Long term (current) use of aspirin: Secondary | ICD-10-CM | POA: Diagnosis not present

## 2015-08-12 DIAGNOSIS — E1159 Type 2 diabetes mellitus with other circulatory complications: Secondary | ICD-10-CM

## 2015-08-12 DIAGNOSIS — G8929 Other chronic pain: Secondary | ICD-10-CM | POA: Diagnosis present

## 2015-08-12 DIAGNOSIS — Z8249 Family history of ischemic heart disease and other diseases of the circulatory system: Secondary | ICD-10-CM | POA: Diagnosis not present

## 2015-08-12 DIAGNOSIS — M545 Low back pain: Secondary | ICD-10-CM | POA: Diagnosis present

## 2015-08-12 DIAGNOSIS — F329 Major depressive disorder, single episode, unspecified: Secondary | ICD-10-CM | POA: Diagnosis present

## 2015-08-12 DIAGNOSIS — M1990 Unspecified osteoarthritis, unspecified site: Secondary | ICD-10-CM | POA: Diagnosis present

## 2015-08-12 DIAGNOSIS — E119 Type 2 diabetes mellitus without complications: Secondary | ICD-10-CM | POA: Diagnosis not present

## 2015-08-12 DIAGNOSIS — Z794 Long term (current) use of insulin: Secondary | ICD-10-CM | POA: Diagnosis not present

## 2015-08-12 DIAGNOSIS — R0789 Other chest pain: Secondary | ICD-10-CM | POA: Diagnosis present

## 2015-08-12 LAB — GLUCOSE, CAPILLARY
Glucose-Capillary: 104 mg/dL — ABNORMAL HIGH (ref 65–99)
Glucose-Capillary: 112 mg/dL — ABNORMAL HIGH (ref 65–99)
Glucose-Capillary: 133 mg/dL — ABNORMAL HIGH (ref 65–99)
Glucose-Capillary: 101 mg/dL — ABNORMAL HIGH (ref 65–99)

## 2015-08-12 LAB — PROTIME-INR
INR: 1.13 (ref 0.00–1.49)
Prothrombin Time: 14.7 seconds (ref 11.6–15.2)

## 2015-08-12 LAB — LIPASE, BLOOD: Lipase: 22 U/L (ref 11–51)

## 2015-08-12 LAB — LIPID PANEL
Cholesterol: 288 mg/dL — ABNORMAL HIGH (ref 0–200)
LDL Cholesterol: UNDETERMINED mg/dL (ref 0–99)
Triglycerides: 2237 mg/dL — ABNORMAL HIGH (ref <150)
VLDL: UNDETERMINED mg/dL (ref 0–40)

## 2015-08-12 LAB — TROPONIN I
Troponin I: <0.03 ng/mL (ref <0.031)
Troponin I: <0.03 ng/mL (ref <0.031)
Troponin I: <0.03 ng/mL (ref <0.031)
Troponin I: <0.03 ng/mL (ref <0.031)

## 2015-08-12 LAB — HEPARIN LEVEL (UNFRACTIONATED)
Heparin Unfractionated: <0.1 IU/mL — ABNORMAL LOW (ref 0.30–0.70)
Heparin Unfractionated: <0.1 IU/mL — ABNORMAL LOW (ref 0.30–0.70)

## 2015-08-12 LAB — AMYLASE: Amylase: 33 U/L (ref 28–100)

## 2015-08-12 LAB — MRSA PCR SCREENING: MRSA by PCR: NEGATIVE

## 2015-08-12 MED ORDER — ASPIRIN EC 81 MG PO TBEC: 81.0000 mg | DELAYED_RELEASE_TABLET | Freq: Every day | ORAL | Status: DC

## 2015-08-12 MED ORDER — ATORVASTATIN CALCIUM 80 MG PO TABS
80.0000 mg | ORAL_TABLET | Freq: Every day | ORAL | Status: DC
  Administered 2015-08-12 – 2015-08-14 (×3): 80 mg via ORAL
  Filled 2015-08-12 (×3): qty 1

## 2015-08-12 MED ORDER — SODIUM CHLORIDE 0.9 % IJ SOLN
3.0000 mL | Freq: Two times a day (BID) | INTRAMUSCULAR | Status: DC
  Administered 2015-08-12 – 2015-08-13 (×2): 3 mL via INTRAVENOUS

## 2015-08-12 MED ORDER — FENOFIBRATE 160 MG PO TABS
160.0000 mg | ORAL_TABLET | Freq: Every day | ORAL | Status: DC
  Administered 2015-08-12 – 2015-08-13 (×2): 160 mg via ORAL
  Filled 2015-08-12 (×2): qty 1

## 2015-08-12 MED ORDER — METOPROLOL TARTRATE 12.5 MG HALF TABLET
12.5000 mg | ORAL_TABLET | Freq: Two times a day (BID) | ORAL | Status: DC
  Administered 2015-08-12 – 2015-08-13 (×4): 12.5 mg via ORAL
  Filled 2015-08-12 (×4): qty 1

## 2015-08-12 MED ORDER — ONDANSETRON HCL 4 MG/2ML IJ SOLN
4.0000 mg | Freq: Once | INTRAMUSCULAR | Status: AC
  Administered 2015-08-12: 4 mg via INTRAVENOUS
  Filled 2015-08-12: qty 2

## 2015-08-12 MED ORDER — METOPROLOL TARTRATE 12.5 MG HALF TABLET: 12.5000 mg | ORAL_TABLET | Freq: Two times a day (BID) | ORAL | Status: DC

## 2015-08-12 MED ORDER — SODIUM CHLORIDE 0.9 % IJ SOLN
3.0000 mL | Freq: Two times a day (BID) | INTRAMUSCULAR | Status: DC
  Administered 2015-08-12 – 2015-08-13 (×3): 3 mL via INTRAVENOUS

## 2015-08-12 MED ORDER — HEPARIN (PORCINE) IN NACL 100-0.45 UNIT/ML-% IJ SOLN
2800.0000 [IU]/h | INTRAMUSCULAR | Status: DC
  Administered 2015-08-12: 1800 [IU]/h via INTRAVENOUS
  Administered 2015-08-12: 1400 [IU]/h via INTRAVENOUS
  Administered 2015-08-13: 2500 [IU]/h via INTRAVENOUS
  Filled 2015-08-12 (×7): qty 250

## 2015-08-12 MED ORDER — SODIUM CHLORIDE 0.9 % WEIGHT BASED INFUSION: 3.0000 mL/kg/h | INTRAVENOUS | Status: AC

## 2015-08-12 MED ORDER — OMEGA-3-ACID ETHYL ESTERS 1 G PO CAPS
2.0000 g | ORAL_CAPSULE | Freq: Two times a day (BID) | ORAL | Status: DC
  Administered 2015-08-12 – 2015-08-14 (×5): 2 g via ORAL
  Filled 2015-08-12 (×5): qty 2

## 2015-08-12 MED ORDER — ONDANSETRON HCL 4 MG/2ML IJ SOLN: 4.0000 mg | Freq: Four times a day (QID) | INTRAMUSCULAR | Status: DC | PRN

## 2015-08-12 MED ORDER — NITROGLYCERIN IN D5W 200-5 MCG/ML-% IV SOLN
0.0000 ug/min | INTRAVENOUS | Status: DC
  Administered 2015-08-12: 25 ug/min via INTRAVENOUS
  Filled 2015-08-12: qty 250

## 2015-08-12 MED ORDER — PERFLUTREN LIPID MICROSPHERE: 1.0000 mL | INTRAVENOUS | Status: DC | PRN

## 2015-08-12 MED ORDER — NITROGLYCERIN IN D5W 200-5 MCG/ML-% IV SOLN
5.0000 ug/min | INTRAVENOUS | Status: DC
  Administered 2015-08-12: 5 ug/min via INTRAVENOUS
  Filled 2015-08-12: qty 250

## 2015-08-12 MED ORDER — HEPARIN BOLUS VIA INFUSION
2000.0000 [IU] | Freq: Once | INTRAVENOUS | Status: AC
  Administered 2015-08-12: 2000 [IU] via INTRAVENOUS
  Filled 2015-08-12: qty 2000

## 2015-08-12 MED ORDER — HEPARIN BOLUS VIA INFUSION
4000.0000 [IU] | Freq: Once | INTRAVENOUS | Status: AC
  Administered 2015-08-12: 4000 [IU] via INTRAVENOUS
  Filled 2015-08-12: qty 4000

## 2015-08-12 MED ORDER — ASPIRIN 81 MG PO CHEW
81.0000 mg | CHEWABLE_TABLET | ORAL | Status: AC
  Administered 2015-08-14: 81 mg via ORAL
  Filled 2015-08-12 (×2): qty 1

## 2015-08-12 MED ORDER — INSULIN PUMP
SUBCUTANEOUS | Status: DC
  Administered 2015-08-12 (×5): via SUBCUTANEOUS
  Administered 2015-08-13: 2.95 via SUBCUTANEOUS
  Administered 2015-08-13 (×3): via SUBCUTANEOUS
  Administered 2015-08-13: 2.75 via SUBCUTANEOUS
  Administered 2015-08-14: 04:00:00 via SUBCUTANEOUS
  Administered 2015-08-14: 1.45 via SUBCUTANEOUS
  Administered 2015-08-14: 1.3 via SUBCUTANEOUS
  Administered 2015-08-14: via SUBCUTANEOUS
  Filled 2015-08-12: qty 1

## 2015-08-12 MED ORDER — SODIUM CHLORIDE 0.9 % IV SOLN: 250.0000 mL | INTRAVENOUS | Status: DC | PRN

## 2015-08-12 MED ORDER — HEPARIN BOLUS VIA INFUSION
4000.0000 [IU] | Freq: Once | INTRAVENOUS | Status: AC
Start: 2015-08-12 — End: 2015-08-12
  Administered 2015-08-12: 4000 [IU] via INTRAVENOUS
  Filled 2015-08-12: qty 4000

## 2015-08-12 MED ORDER — LORATADINE 10 MG PO TABS
10.0000 mg | ORAL_TABLET | Freq: Every day | ORAL | Status: DC
  Administered 2015-08-12 – 2015-08-13 (×2): 10 mg via ORAL
  Filled 2015-08-12 (×2): qty 1

## 2015-08-12 MED ORDER — PRASUGREL HCL 10 MG PO TABS
10.0000 mg | ORAL_TABLET | Freq: Every day | ORAL | Status: DC
  Administered 2015-08-12 – 2015-08-13 (×2): 10 mg via ORAL
  Filled 2015-08-12 (×2): qty 1

## 2015-08-12 MED ORDER — ONDANSETRON HCL 4 MG PO TABS: 4.0000 mg | ORAL_TABLET | Freq: Four times a day (QID) | ORAL | Status: DC | PRN

## 2015-08-12 MED ORDER — MORPHINE SULFATE (PF) 2 MG/ML IV SOLN
2.0000 mg | Freq: Four times a day (QID) | INTRAVENOUS | Status: DC | PRN
  Administered 2015-08-12 – 2015-08-14 (×9): 2 mg via INTRAVENOUS
  Filled 2015-08-12 (×9): qty 1

## 2015-08-12 MED ORDER — ALUM & MAG HYDROXIDE-SIMETH 200-200-20 MG/5ML PO SUSP
30.0000 mL | Freq: Four times a day (QID) | ORAL | Status: DC | PRN
  Administered 2015-08-12: 30 mL via ORAL
  Filled 2015-08-12: qty 30

## 2015-08-12 MED ORDER — ALLOPURINOL 300 MG PO TABS
300.0000 mg | ORAL_TABLET | Freq: Every day | ORAL | Status: DC
  Administered 2015-08-12 – 2015-08-13 (×2): 300 mg via ORAL
  Filled 2015-08-12 (×2): qty 1

## 2015-08-12 MED ORDER — SODIUM CHLORIDE 0.9 % WEIGHT BASED INFUSION: 1.0000 mL/kg/h | INTRAVENOUS | Status: DC

## 2015-08-12 MED ORDER — PERFLUTREN LIPID MICROSPHERE
1.0000 mL | Freq: Once | INTRAVENOUS | Status: AC
  Administered 2015-08-12: 4 mL via INTRAVENOUS
  Filled 2015-08-12: qty 10

## 2015-08-12 MED ORDER — SODIUM CHLORIDE 0.9 % IJ SOLN: 3.0000 mL | INTRAMUSCULAR | Status: DC | PRN

## 2015-08-12 MED ORDER — ASPIRIN EC 81 MG PO TBEC
81.0000 mg | DELAYED_RELEASE_TABLET | Freq: Every day | ORAL | Status: DC
  Administered 2015-08-12 – 2015-08-13 (×2): 81 mg via ORAL
  Filled 2015-08-12 (×2): qty 1

## 2015-08-12 MED ORDER — MORPHINE SULFATE (PF) 2 MG/ML IV SOLN
2.0000 mg | Freq: Once | INTRAVENOUS | Status: AC
  Administered 2015-08-12: 2 mg via INTRAVENOUS
  Filled 2015-08-12: qty 1

## 2015-08-12 NOTE — H&P (Signed)
Patient ID: Alec [name] MRN: [id number], DOB/AGE: 05-22-1961  Admit date: 08/11/2015 Primary Physician: Sheela Stack, MD Primary Cardiologist: Dr. Daneen Schick  A/P:  54 yo M w a h/o CAD s/p DES to dRCA (04/27/14 in setting of UA), DM, HLD with severe hypertriglyceridemia casuing prior pancreatitis, & Gout p/w UA similar to his prior presentation.    # UA w a h/o CAD - Troponin negative x 2.  EKG remarkable.  He has ongoing pain.   - As he has ongoing pain, we will further up-titrate his NTG gtt for improved control. - Otherwise, will start a Heparin gtt & continue ASA, Atorvastatin, & Prasugrel. - Initiate Metoprolol 12.5 mg PO BID.   - Monitor on telemetry with serial cardiac enzymes. - TTE this morning.   - If able to control pain, will delay cath until Monday.  NPO for now in case, we are not able to achieve this.    # h/o DM - He currently has a self-managed pump. - Hold home Empagliflozin for now.   - Monitor Q6H glucose while NPO.   - Check HgbA1c.  # h/o HLD - TG were 2489 04/2014.   - Check follow-up panel. - Continue home Atorvastatin, Fenofibrate, Vascepa.  # h/o Gout - Adequately controlled. - Continue home Allopurinol.  # h/o Depression - Adequately controlled. - Continue home SSRI.    CC:  CP  HPI:  54 yo M w a h/o CAD s/p DES to dRCA (04/27/14 in setting of UA), DM, HLD with severe hypertriglyceridemia casuing prior pancreatitis, & Gout was transferred this evening from OSH where he presented 22:48 with a 2 hour h/o constant chest discomfort similar to his prior episode of UA (04/2014).  The pain woke him from his sleep at 20:30.  He described it as a 7/10 left-sided chest discomfort that radiated around his left side & down his left arm.  He had associated diaphoresis, dyspnea, lightheadedness, & nausea.  Upon arrival to the OSH, his pain failed to response to NTG SL x 3, so he was transitioned to a NTG gtt, which further improved the pain.  He was also given  ASA 325, Zofran, & Morphine.  Otherwise, he denied palpitations, lower extremity swelling, orthopnea, or paroxysmal nocturnal dyspnea.  At baseline, he works in an Actuary station, which requires a lot of walking.  He claimed compliance with his medications.  Of note, he was switched from Ticagrelor to Prasugrel 09/2014 due to insurance issues.  He was last seen by his primary cardiologist Dr. Daneen Schick 06/2014 when he was doing well.    Relevant Data - Labs:  Troponin < 0.03 x 2 (2300, 0230) - EKG:  NSR without ST-T abnormalities - TTE (04/27/14):  EF 65-70%, mild TR  Problem List  Past Medical History  Diagnosis Date  . Mixed hyperlipidemia   . Metabolic syndrome   . History of pancreatitis   . Arthritis   . Chronic lower back pain   . Gout   . Depression   . History of echocardiogram     a. 04/27/14 with EF 65-70% and mild TR.  . Type 2 diabetes mellitus (HCC)     on Insulin pump  . CAD (coronary artery disease)     a. 04/27/14 DES to dRCA; widely patent L coronary system    Past Surgical History  Procedure Laterality Date  . Carpal tunnel release Bilateral 2006-2008    left 12/2004; right 10/2006  . Shoulder arthroscopy  Right ~ 2010  . Back surgery    . Knee arthroscopy Bilateral 2000's    right 08/2003;   . Posterior lumbar fusion  07/2009  . Lumbar laminectomy Left 05/2006  . Functional endoscopic sinus surgery  10/2001; 08/2005;   . Left heart catheterization with coronary angiogram N/A 04/27/2014    Procedure: LEFT HEART CATHETERIZATION WITH CORONARY ANGIOGRAM;  Surgeon: [name], MD;  Location: [facility] CATH LAB;  Service: Cardiovascular;  Laterality: N/A;  . Percutaneous stent intervention  04/27/2014    Procedure: PERCUTANEOUS STENT INTERVENTION;  Surgeon: [name], MD;  Location: [facility] CATH LAB;  Service: Cardiovascular;;  DES x2 (distal +prox RCA)  . Knee arthroscopy with medial menisectomy Left 04/20/2015    Procedure: LEFT ARTHROSCOPY KNEE WITH MEDIAL  MENISECTOMY;  Surgeon: Melrose Nakayama, MD;  Location: [place];  Service: Orthopedics;  Laterality: Left;  Partial medial menisectomy, chondroplasty.  . Chondroplasty  04/20/2015    Procedure: CHONDROPLASTY;  Surgeon: Melrose Nakayama, MD;  Location: [place];  Service: Orthopedics;;    Allergies  Allergies  Allergen Reactions  . Percocet [Oxycodone-Acetaminophen] Nausea Only    Makes patient feel not with it   Home Medications  Prior to Admission medications   Medication Sig Start Date End Date Taking? Authorizing Provider  allopurinol (ZYLOPRIM) 300 MG tablet Take 1 tablet (300 mg total) by mouth daily. 04/28/14 11/01/16  Eileen Stanford, PA-C  aspirin EC 81 MG tablet Take 81 mg by mouth daily.    Historical Provider, MD  Atorvastatin Calcium (LIPITOR PO) Take 1 tablet by mouth daily.    Historical Provider, MD  BAYER CONTOUR TEST test strip 1 each by Other route as needed.  05/02/14   Historical Provider, MD  cetirizine (ZYRTEC) 10 MG tablet Take 10 mg by mouth daily.    Historical Provider, MD  Empagliflozin (JARDIANCE) 25 MG TABS Take 25 mg by mouth daily.    Historical Provider, MD  escitalopram (LEXAPRO) 20 MG tablet TAKE 1 TABLET (20 MG) BY MOUTH DAILY. 06/23/15   Belva Crome, MD  fenofibrate 160 MG tablet TAKE 1 TABLET (160 MG) BY MOUTH DAILY. *PT NEEDS TO CONTACT OFFICE TO SCHEDULE APPT* 07/31/15   Belva Crome, MD  HYDROcodone-acetaminophen (NORCO/VICODIN) 5-325 MG per tablet Take 1-2 tablets by mouth every 6 (six) hours as needed for moderate pain. 04/20/15   Loni Dolly, PA-C  insulin regular human CONCENTRATED (HUMULIN R) 500 UNIT/ML SOLN injection Inject 30 Units into the skin 2 (two) times daily. 1.25 units per hour, bolus as needed    Historical Provider, MD  nitroGLYCERIN (NITROSTAT) 0.4 MG SL tablet Place 1 tablet (0.4 mg total) under the tongue every 5 (five) minutes x 3 doses as needed for chest pain. 04/28/14   Eileen Stanford, PA-C    prasugrel (EFFIENT) 10 MG TABS tablet Take 1 tablet (10 mg total) by mouth daily. 09/01/14   Belva Crome, MD  VASCEPA 1 G CAPS Take 4 g by mouth daily. 03/03/14   Historical Provider, MD   Family History  Family History  Problem Relation Age of Onset  . Coronary artery disease Mother     MI in her 60's  . Heart attack    . Heart attack Mother    Social History  Social History   Social History  . Marital Status: Married    Spouse Name: N/A  . Number of Children: 2  . Years of Education: N/A  Occupational History  . Actuary    Social History Main Topics  . Smoking status: Never Smoker   . Smokeless tobacco: Never Used  . Alcohol Use: Yes     Comment: occ  . Drug Use: No  . Sexual Activity: Not on file   Other Topics Concern  . Not on file   Social History Narrative    Review of Systems:  12 Point ROS was negative except as indicated in HPI.    Physical Exam  Blood pressure 129/79, pulse 74, temperature 98.6 F (37 C), temperature source Oral, resp. rate 17, height 5\' 10"  (1.778 m), weight 116.801 kg (257 lb 8 oz), SpO2 91 %.  General:  Mildly diaphoretic. Neck:   Supple without bruits or JVD. Cardiac: RRR, no m/r/g. Lungs:   Resp regular and unlabored, CTA. Abdomen:  S, NT, ND, NABS. Extremities:  No c/c/e. Neuro:  AO x 3, 5/5 strength throughout, no focal deficits.  Labs  Troponin (Point of Care Test) No results for input(s): TROPIPOC in the last 72 hours.  Recent Labs  08/11/15 2300 08/12/15 0230  TROPONINI <0.03 <0.03   Lab Results  Component Value Date   WBC 8.5 08/11/2015   HGB 15.8 08/11/2015   HCT 43.8 08/11/2015   MCV 86.4 08/11/2015   PLT 208 08/11/2015    Recent Labs Lab 08/11/15 2300  NA 134*  K 3.9  CL 101  CO2 26  BUN 16  CREATININE 0.65  CALCIUM 8.9  PROT 6.7  BILITOT 0.4  ALKPHOS 75  ALT 17  AST 21  GLUCOSE 180*   Lab Results  Component Value Date   CHOL 419* 04/27/2014   HDL NOT REPORTED DUE TO HIGH  TRIGLYCERIDES 04/27/2014   LDLCALC UNABLE TO CALCULATE IF TRIGLYCERIDE OVER 400 mg/dL 04/27/2014   TRIG 2489* 04/27/2014   Lab Results  Component Value Date   DDIMER 0.90* 04/26/2014   Radiology/Studies  Dg Chest 2 View  08/11/2015  CLINICAL DATA:  Chest pain EXAM: CHEST  2 VIEW COMPARISON:  04/26/2014 FINDINGS: Cardiomediastinal silhouette is stable. Mild elevation of the right hemidiaphragm again noted. No acute infiltrate or pleural effusion. No pulmonary edema. Stable degenerative changes thoracic spine. IMPRESSION: No active cardiopulmonary disease. Electronically Signed   By: Lahoma Crocker M.D.   On: 08/11/2015 23:58   Signed, Alfonso Ramus, MD 08/12/2015, 5:29 AM

## 2015-08-12 NOTE — Progress Notes (Signed)
ANTICOAGULATION CONSULT NOTE - Initial Consult  Pharmacy Consult for Heparin  Indication: chest pain/ACS  Allergies  Allergen Reactions  . Percocet [Oxycodone-Acetaminophen] Nausea Only    Makes patient feel not with it    Patient Measurements: Height: 5\' 10"  (177.8 cm) Weight: 257 lb 8 oz (116.801 kg) IBW/kg (Calculated) : 73  Vital Signs: Temp: 98.6 F (37 C) (11/26 0420) Temp Source: Oral (11/26 0420) BP: 131/72 mmHg (11/26 0532) Pulse Rate: 69 (11/26 0532)  Labs:  Recent Labs  08/11/15 2300 08/12/15 0230  HGB 15.8  --   HCT 43.8  --   PLT 208  --   CREATININE 0.65  --   TROPONINI <0.03 <0.03    Estimated Creatinine Clearance: 136.7 mL/min (by C-G formula based on Cr of 0.65).   Medical History: Past Medical History  Diagnosis Date  . Mixed hyperlipidemia   . Metabolic syndrome   . History of pancreatitis   . Arthritis   . Chronic lower back pain   . Gout   . Depression   . History of echocardiogram     a. 04/27/14 with EF 65-70% and mild TR.  . Type 2 diabetes mellitus (HCC)     on Insulin pump  . CAD (coronary artery disease)     a. 04/27/14 DES to dRCA; widely patent L coronary system    Assessment: 54 y/o M with hx of cardiac stenting, here with CP, troponin is negative x 2, starting heparin per pharmacy, CBC/renal function good, other labs/meds reviewed.   Goal of Therapy:  Heparin level 0.3-0.7 units/ml Monitor platelets by anticoagulation protocol: Yes   Plan:  -Heparin 4000 units BOLUS -Start heparin drip at 1400 units/hr -1300 HL -Daily CBC/HL -Monitor for bleeding  Narda Bonds 08/12/2015,5:55 AM

## 2015-08-12 NOTE — Progress Notes (Signed)
ANTICOAGULATION CONSULT NOTE - Initial Consult  Pharmacy Consult for Heparin  Indication: chest pain/ACS  Allergies  Allergen Reactions  . Percocet [Oxycodone-Acetaminophen] Nausea Only    Makes patient feel not with it    Patient Measurements: Height: 5\' 10"  (177.8 cm) Weight: 257 lb 8 oz (116.801 kg) IBW/kg (Calculated) : 73  Vital Signs: Temp: 97.6 F (36.4 C) (11/26 1104) Temp Source: Oral (11/26 1104) BP: 131/72 mmHg (11/26 0532) Pulse Rate: 74 (11/26 0625)  Labs:  Recent Labs  08/11/15 2300 08/12/15 0230 08/12/15 0706 08/12/15 1217  HGB 15.8  --   --   --   HCT 43.8  --   --   --   PLT 208  --   --   --   LABPROT  --   --  14.7  --   INR  --   --  1.13  --   HEPARINUNFRC  --   --   --  <0.10*  CREATININE 0.65  --   --   --   TROPONINI <0.03 <0.03 <0.03 <0.03    Estimated Creatinine Clearance: 136.7 mL/min (by C-G formula based on Cr of 0.65).   Medical History: Past Medical History  Diagnosis Date  . Mixed hyperlipidemia   . Metabolic syndrome   . History of pancreatitis   . Arthritis   . Chronic lower back pain   . Gout   . Depression   . History of echocardiogram     a. 04/27/14 with EF 65-70% and mild TR.  . Type 2 diabetes mellitus (HCC)     on Insulin pump  . CAD (coronary artery disease)     a. 04/27/14 DES to dRCA; widely patent L coronary system    Assessment: 54 y/o M with hx of cardiac stenting, here with CP, troponin is negative x 2, starting heparin per pharmacy. HL <0.10 after 4000 unit bolus followed by 1400 unit/hr infusion. Checked with RN to ensure patient did not lose access. RN confirms pump is running without loss of line. Likely need to be more aggressive with dosing due to pt weight and renal clearance. Hgb 15.8, plt 208.   Goal of Therapy:  Heparin level 0.3-0.7 units/ml Monitor platelets by anticoagulation protocol: Yes   Plan:  -Heparin 4000 units BOLUS -Start heparin drip at 1800 units/hr -6hr HL -Daily  CBC/HL -Monitor for s/sx bleeding  Judieth Keens, PharmD. PGY1 resident 08/12/2015,2:05 PM

## 2015-08-12 NOTE — Progress Notes (Signed)
ANTICOAGULATION CONSULT NOTE - Follow Up Consult  Pharmacy Consult for heparin Indication: chest pain/ACS  Allergies  Allergen Reactions  . Percocet [Oxycodone-Acetaminophen] Nausea Only    Makes patient feel not with it    Patient Measurements: Height: 5\' 10"  (177.8 cm) Weight: 257 lb 8 oz (116.801 kg) IBW/kg (Calculated) : 73 Heparin Dosing Weight: 99 kg  Vital Signs: Temp: 98.3 F (36.8 C) (11/26 1618) Temp Source: Oral (11/26 1618) BP: 113/56 mmHg (11/26 2034) Pulse Rate: 78 (11/26 2034)  Labs:  Recent Labs  08/11/15 2300  08/12/15 0706 08/12/15 1217 08/12/15 1800 08/12/15 1952  HGB 15.8  --   --   --   --   --   HCT 43.8  --   --   --   --   --   PLT 208  --   --   --   --   --   LABPROT  --   --  14.7  --   --   --   INR  --   --  1.13  --   --   --   HEPARINUNFRC  --   --   --  <0.10*  --  <0.10*  CREATININE 0.65  --   --   --   --   --   TROPONINI <0.03  < > <0.03 <0.03 <0.03  --   < > = values in this interval not displayed.  Estimated Creatinine Clearance: 136.7 mL/min (by C-G formula based on Cr of 0.65).   Medications:  Scheduled:  . allopurinol  300 mg Oral Daily  . [START ON 08/13/2015] aspirin  81 mg Oral Pre-Cath  . aspirin EC  81 mg Oral Daily  . atorvastatin  80 mg Oral q1800  . fenofibrate  160 mg Oral Daily  . insulin pump   Subcutaneous 6 times per day  . loratadine  10 mg Oral Daily  . metoprolol tartrate  12.5 mg Oral BID  . omega-3 acid ethyl esters  2 g Oral BID WC  . prasugrel  10 mg Oral Daily  . sodium chloride  3 mL Intravenous Q12H  . sodium chloride  3 mL Intravenous Q12H   Infusions:  . [START ON 08/13/2015] sodium chloride     Followed by  . [START ON 08/13/2015] sodium chloride    . heparin 1,800 Units/hr (08/12/15 1651)  . nitroGLYCERIN 25 mcg/min (08/12/15 [id number])    Assessment: 54 yo male with ACS is currently on subtherapeutic heparin.  Heparin level is <0.1.  Per RN, no problem with infusion.   Goal of  Therapy:  Heparin level 0.3-0.7 units/ml Monitor platelets by anticoagulation protocol: Yes   Plan:  - Heparin 2000 units iv bolus x1, then increase heparin drip to 2000 units/hr - 6hr heparin level  Coy Rochford, Tsz-Yin 08/12/2015,8:51 PM

## 2015-08-12 NOTE — Progress Notes (Signed)
Echocardiogram 2D Echocardiogram has been performed.  Alec [name] 08/12/2015, 1:07 PM

## 2015-08-12 NOTE — Progress Notes (Signed)
Patient ID: Alec [name], male   DOB: Sep 30, 1960, 54 y.o.   MRN: [id number]    Subjective:  Still feels poorly with chest tightness   Objective:  Filed Vitals:   08/12/15 0432 08/12/15 0532 08/12/15 0625 08/12/15 0707  BP: 129/79 131/72    Pulse: 74 69 74   Temp:    98.1 F (36.7 C)  TempSrc:    Oral  Resp: 17 13    Height:      Weight:      SpO2: 91% 95%      Intake/Output from previous day:  Intake/Output Summary (Last 24 hours) at 08/12/15 0910 Last data filed at 08/12/15 [id number]  Gross per 24 hour  Intake  23.93 ml  Output      0 ml  Net  23.93 ml    Physical Exam: Affect appropriate Overweight white male  HEENT: normal Neck supple with no adenopathy JVP normal no bruits no thyromegaly Lungs clear with no wheezing and good diaphragmatic motion Heart:  S1/S2 no murmur, no rub, gallop or click PMI normal Abdomen: benighn, BS positve, no tenderness, no AAA no bruit.  No HSM or HJR Distal pulses intact with no bruits No edema Neuro non-focal Skin warm and dry No muscular weakness   Lab Results: Basic Metabolic Panel:  Recent Labs  08/11/15 2300  NA 134*  K 3.9  CL 101  CO2 26  GLUCOSE 180*  BUN 16  CREATININE 0.65  CALCIUM 8.9   Liver Function Tests:  Recent Labs  08/11/15 2300  AST 21  ALT 17  ALKPHOS 75  BILITOT 0.4  PROT 6.7  ALBUMIN 3.5   CBC:  Recent Labs  08/11/15 2300  WBC 8.5  HGB 15.8  HCT 43.8  MCV 86.4  PLT 208   Cardiac Enzymes:  Recent Labs  08/11/15 2300 08/12/15 0230 08/12/15 0706  TROPONINI <0.03 <0.03 <0.03   Fasting Lipid Panel:  Recent Labs  08/12/15 0706  CHOL 288*  HDL NOT REPORTED DUE TO HIGH TRIGLYCERIDES  LDLCALC UNABLE TO CALCULATE IF TRIGLYCERIDE OVER 400 mg/dL  TRIG 2237*  CHOLHDL NOT REPORTED DUE TO HIGH TRIGLYCERIDES    Imaging: Dg Chest 2 View  08/11/2015  CLINICAL DATA:  Chest pain EXAM: CHEST  2 VIEW COMPARISON:  04/26/2014 FINDINGS: Cardiomediastinal silhouette is stable. Mild  elevation of the right hemidiaphragm again noted. No acute infiltrate or pleural effusion. No pulmonary edema. Stable degenerative changes thoracic spine. IMPRESSION: No active cardiopulmonary disease. Electronically Signed   By: Lahoma Crocker M.D.   On: 08/11/2015 23:58    Cardiac Studies:  ECG:  NSR no acute St/T wave changes    Telemetry:  NSR no arrhythmia   Echo:   Medications:   . allopurinol  300 mg Oral Daily  . aspirin EC  81 mg Oral Daily  . atorvastatin  80 mg Oral q1800  . fenofibrate  160 mg Oral Daily  . insulin pump   Subcutaneous 6 times per day  . loratadine  10 mg Oral Daily  . metoprolol tartrate  12.5 mg Oral BID  . omega-3 acid ethyl esters  2 g Oral BID WC  . prasugrel  10 mg Oral Daily  . sodium chloride  3 mL Intravenous Q12H     . heparin 1,400 Units/hr (08/12/15 [id number])  . nitroGLYCERIN 25 mcg/min (08/12/15 [id number])    Assessment/Plan:  Chest Pain:  Known CAD with stenting distal RCA 04/2014  R/O no acute ECG changes continue heparin and  nitro Cath on Monday  DM:  2000 cal ADA diet and insulin pump  Chol:  Had run out of fenofibrate at risk for pancreatitis will check amylase and lipase  Jenkins Rouge 08/12/2015, 9:10 AM

## 2015-08-13 LAB — CBC
HCT: 40.2 % (ref 39.0–52.0)
Hemoglobin: 13.7 g/dL (ref 13.0–17.0)
MCH: 30.2 pg (ref 26.0–34.0)
MCHC: 34.1 g/dL (ref 30.0–36.0)
MCV: 88.5 fL (ref 78.0–100.0)
Platelets: 176 10*3/uL (ref 150–400)
RBC: 4.54 MIL/uL (ref 4.22–5.81)
RDW: 14.1 % (ref 11.5–15.5)
WBC: 9.4 10*3/uL (ref 4.0–10.5)

## 2015-08-13 LAB — GLUCOSE, CAPILLARY
Glucose-Capillary: 105 mg/dL — ABNORMAL HIGH (ref 65–99)
Glucose-Capillary: 120 mg/dL — ABNORMAL HIGH (ref 65–99)
Glucose-Capillary: 125 mg/dL — ABNORMAL HIGH (ref 65–99)
Glucose-Capillary: 146 mg/dL — ABNORMAL HIGH (ref 65–99)
Glucose-Capillary: 153 mg/dL — ABNORMAL HIGH (ref 65–99)
Glucose-Capillary: 89 mg/dL (ref 65–99)

## 2015-08-13 LAB — BASIC METABOLIC PANEL
Anion gap: 7 (ref 5–15)
BUN: 10 mg/dL (ref 6–20)
CO2: 28 mmol/L (ref 22–32)
Calcium: 8.5 mg/dL — ABNORMAL LOW (ref 8.9–10.3)
Chloride: 100 mmol/L — ABNORMAL LOW (ref 101–111)
Creatinine, Ser: 0.7 mg/dL (ref 0.61–1.24)
GFR calc Af Amer: >60 mL/min (ref >60)
GFR calc non Af Amer: >60 mL/min (ref >60)
Glucose, Bld: 170 mg/dL — ABNORMAL HIGH (ref 65–99)
Potassium: 3.9 mmol/L (ref 3.5–5.1)
Sodium: 135 mmol/L (ref 135–145)

## 2015-08-13 LAB — HEPARIN LEVEL (UNFRACTIONATED)
Heparin Unfractionated: <0.1 IU/mL — ABNORMAL LOW (ref 0.30–0.70)
Heparin Unfractionated: 0.37 IU/mL (ref 0.30–0.70)
Heparin Unfractionated: <0.1 IU/mL — ABNORMAL LOW (ref 0.30–0.70)

## 2015-08-13 MED ORDER — CETIRIZINE HCL 10 MG PO TABS
10.0000 mg | ORAL_TABLET | Freq: Every day | ORAL | Status: DC
  Filled 2015-08-13: qty 1

## 2015-08-13 MED ORDER — SODIUM CHLORIDE 0.9 % IV SOLN
INTRAVENOUS | Status: DC
  Administered 2015-08-14: 06:00:00 via INTRAVENOUS

## 2015-08-13 MED ORDER — HEPARIN BOLUS VIA INFUSION
3000.0000 [IU] | Freq: Once | INTRAVENOUS | Status: AC
  Administered 2015-08-13: 3000 [IU] via INTRAVENOUS
  Filled 2015-08-13: qty 3000

## 2015-08-13 MED ORDER — HEPARIN BOLUS VIA INFUSION
2000.0000 [IU] | Freq: Once | INTRAVENOUS | Status: AC
  Administered 2015-08-13: 2000 [IU] via INTRAVENOUS
  Filled 2015-08-13: qty 2000

## 2015-08-13 MED ORDER — MONTELUKAST SODIUM 10 MG PO TABS
10.0000 mg | ORAL_TABLET | Freq: Every day | ORAL | Status: DC
  Administered 2015-08-13: 10 mg via ORAL
  Filled 2015-08-13: qty 1

## 2015-08-13 MED ORDER — ACETAMINOPHEN 325 MG PO TABS
650.0000 mg | ORAL_TABLET | ORAL | Status: DC | PRN
  Administered 2015-08-13 – 2015-08-14 (×2): 650 mg via ORAL
  Filled 2015-08-13 (×2): qty 2

## 2015-08-13 MED ORDER — SODIUM CHLORIDE 0.9 % IV SOLN: INTRAVENOUS | Status: DC

## 2015-08-13 NOTE — Progress Notes (Signed)
[place] for heparin Indication: chest pain/ACS  Allergies  Allergen Reactions  . Percocet [Oxycodone-Acetaminophen] Nausea Only    Makes patient feel not with it    Patient Measurements: Height: 5\' 10"  (177.8 cm) Weight: 257 lb 8 oz (116.801 kg) IBW/kg (Calculated) : 73 Heparin Dosing Weight: 99 kg  Vital Signs: Temp: 98 F (36.7 C) (11/27 0031) Temp Source: Oral (11/27 0031) BP: 110/70 mmHg (11/27 0031) Pulse Rate: 86 (11/27 0031)  Labs:  Recent Labs  08/11/15 2300  08/12/15 0706 08/12/15 1217 08/12/15 1800 08/12/15 1952 08/13/15 0302  HGB 15.8  --   --   --   --   --  13.7  HCT 43.8  --   --   --   --   --  40.2  PLT 208  --   --   --   --   --  176  LABPROT  --   --  14.7  --   --   --   --   INR  --   --  1.13  --   --   --   --   HEPARINUNFRC  --   --   --  <0.10*  --  <0.10* <0.10*  CREATININE 0.65  --   --   --   --   --   --   TROPONINI <0.03  < > <0.03 <0.03 <0.03  --   --   < > = values in this interval not displayed.  Estimated Creatinine Clearance: 136.7 mL/min (by C-G formula based on Cr of 0.65).   Assessment: 54 y.o. male with chest pain for heparin    Goal of Therapy:  Heparin level 0.3-0.7 units/ml Monitor platelets by anticoagulation protocol: Yes   Plan:  Heparin 3000 units IV bolus, then increase heparin  2500 units/hr Check heparin level in 6 hours.   Early Steel, Bronson Curb 08/13/2015,3:37 AM

## 2015-08-13 NOTE — Progress Notes (Signed)
Inpatient Diabetes Program Recommendations  AACE/ADA: New Consensus Statement on Inpatient Glycemic Control (2015)  Target Ranges:  Prepandial:   less than 140 mg/dL      Peak postprandial:   less than 180 mg/dL (1-2 hours)      Critically ill patients:  140 - 180 mg/dL   Review of Glycemic Control  Noted patient to be using own insulin pump for glucose control  Glucose is well controlled at this time. Pump flowsheet has been documented as well each shift.  Thank you Rosita Kea, RN, MSN, CDE  Diabetes Inpatient Program Office: [phone] Pager: [phone] 8:00 am to 5:00 pm

## 2015-08-13 NOTE — Progress Notes (Signed)
[place] for heparin Indication: chest pain/ACS  Allergies  Allergen Reactions  . Percocet [Oxycodone-Acetaminophen] Nausea Only    Makes patient feel not with it    Patient Measurements: Height: 5\' 10"  (177.8 cm) Weight: 255 lb 9.6 oz (115.939 kg) IBW/kg (Calculated) : 73 Heparin Dosing Weight: 99 kg  Vital Signs: Temp: 98.3 F (36.8 C) (11/27 1105) Temp Source: Oral (11/27 1105) BP: 139/66 mmHg (11/27 1107) Pulse Rate: 78 (11/27 1107)  Labs:  Recent Labs  08/11/15 2300  08/12/15 0706  08/12/15 1217 08/12/15 1800 08/12/15 1952 08/13/15 0302 08/13/15 1120  HGB 15.8  --   --   --   --   --   --  13.7  --   HCT 43.8  --   --   --   --   --   --  40.2  --   PLT 208  --   --   --   --   --   --  176  --   LABPROT  --   --  14.7  --   --   --   --   --   --   INR  --   --  1.13  --   --   --   --   --   --   HEPARINUNFRC  --   --   --   < > <0.10*  --  <0.10* <0.10* 0.37  CREATININE 0.65  --   --   --   --   --   --  0.70  --   TROPONINI <0.03  < > <0.03  --  <0.03 <0.03  --   --   --   < > = values in this interval not displayed.  Estimated Creatinine Clearance: 136.2 mL/min (by C-G formula based on Cr of 0.7).   Assessment: 54 y.o. male with chest pain. Pharmacy consulted to dose heparin.   6hr HL 0.37 on 2500units/hr. Continue curernt rate and chech 6hr HL to confirm. H/H stable. No bleeding noted   Goal of Therapy:  Heparin level 0.3-0.7 units/ml Monitor platelets by anticoagulation protocol: Yes   Plan:  Continue heparin @ 2500 units/hr 6hr HL (to confirm) Monitor for s/s of bleeding   Unika Nazareno C. Lennox Grumbles, PharmD Pharmacy Resident  Pager: [phone] 08/13/2015 2:26 PM

## 2015-08-13 NOTE — Progress Notes (Addendum)
Patient ID: Alec [name], male   DOB: 03-05-1961, 54 y.o.   MRN: [id number]    Subjective:  Still feels poorly with chest tightness   Objective:  Filed Vitals:   08/13/15 0031 08/13/15 0332 08/13/15 0351 08/13/15 0708  BP: 110/70 124/56    Pulse: 86 80    Temp: 98 F (36.7 C)  97.6 F (36.4 C) 98.3 F (36.8 C)  TempSrc: Oral  Oral Oral  Resp: 16 17    Height:      Weight:   115.939 kg (255 lb 9.6 oz)   SpO2: 95% 96%      Intake/Output from previous day:  Intake/Output Summary (Last 24 hours) at 08/13/15 0850 Last data filed at 08/13/15 0820  Gross per 24 hour  Intake 1758.73 ml  Output   1750 ml  Net   8.73 ml    Physical Exam: Affect appropriate Overweight white male  HEENT: normal Neck supple with no adenopathy JVP normal no bruits no thyromegaly Lungs clear with no wheezing and good diaphragmatic motion Heart:  S1/S2 no murmur, no rub, gallop or click PMI normal Abdomen: benighn, BS positve, no tenderness, no AAA no bruit.  No HSM or HJR Distal pulses intact with no bruits No edema Neuro non-focal Skin warm and dry No muscular weakness   Lab Results: Basic Metabolic Panel:  Recent Labs  08/11/15 2300 08/13/15 0302  NA 134* 135  K 3.9 3.9  CL 101 100*  CO2 26 28  GLUCOSE 180* 170*  BUN 16 10  CREATININE 0.65 0.70  CALCIUM 8.9 8.5*   Liver Function Tests:  Recent Labs  08/11/15 2300  AST 21  ALT 17  ALKPHOS 75  BILITOT 0.4  PROT 6.7  ALBUMIN 3.5   CBC:  Recent Labs  08/11/15 2300 08/13/15 0302  WBC 8.5 9.4  HGB 15.8 13.7  HCT 43.8 40.2  MCV 86.4 88.5  PLT 208 176   Cardiac Enzymes:  Recent Labs  08/12/15 0706 08/12/15 1217 08/12/15 1800  TROPONINI <0.03 <0.03 <0.03   Fasting Lipid Panel:  Recent Labs  08/12/15 0706  CHOL 288*  HDL NOT REPORTED DUE TO HIGH TRIGLYCERIDES  LDLCALC UNABLE TO CALCULATE IF TRIGLYCERIDE OVER 400 mg/dL  TRIG 2237*  CHOLHDL NOT REPORTED DUE TO HIGH TRIGLYCERIDES    Imaging: Dg  Chest 2 View  08/11/2015  CLINICAL DATA:  Chest pain EXAM: CHEST  2 VIEW COMPARISON:  04/26/2014 FINDINGS: Cardiomediastinal silhouette is stable. Mild elevation of the right hemidiaphragm again noted. No acute infiltrate or pleural effusion. No pulmonary edema. Stable degenerative changes thoracic spine. IMPRESSION: No active cardiopulmonary disease. Electronically Signed   By: Lahoma Crocker M.D.   On: 08/11/2015 23:58    Cardiac Studies:  ECG:  NSR no acute St/T wave changes    Telemetry:  NSR no arrhythmia   Echo:  11/26  EF 60-65% no RWMA  Medications:   . allopurinol  300 mg Oral Daily  . aspirin  81 mg Oral Pre-Cath  . aspirin EC  81 mg Oral Daily  . atorvastatin  80 mg Oral q1800  . fenofibrate  160 mg Oral Daily  . insulin pump   Subcutaneous 6 times per day  . loratadine  10 mg Oral Daily  . metoprolol tartrate  12.5 mg Oral BID  . omega-3 acid ethyl esters  2 g Oral BID WC  . prasugrel  10 mg Oral Daily  . sodium chloride  3 mL Intravenous Q12H  .  sodium chloride  3 mL Intravenous Q12H     . sodium chloride    . heparin 2,500 Units/hr (08/13/15 0342)  . nitroGLYCERIN 25 mcg/min (08/12/15 [id number])    Assessment/Plan:  Chest Pain:  Known CAD with stenting distal RCA 04/2014  R/O no acute ECG changes continue heparin and nitro Cath on Monday orders written palced on board Review of echo shows normal EF and no discrete RWMA  DM:  2000 cal ADA diet and insulin pump  Chol:  Had run out of fenofibrate at risk for pancreatitis will check amylase and lipase  Jenkins Rouge 08/13/2015, 8:50 AM

## 2015-08-13 NOTE — Progress Notes (Signed)
[facility] for heparin Indication: chest pain/ACS  Allergies  Allergen Reactions  . Percocet [Oxycodone-Acetaminophen] Nausea Only    Makes patient feel not with it    Patient Measurements: Height: 5\' 10"  (177.8 cm) Weight: 255 lb 9.6 oz (115.939 kg) IBW/kg (Calculated) : 73 Heparin Dosing Weight: 99 kg  Vital Signs: Temp: 98.2 F (36.8 C) (11/27 1600) Temp Source: Oral (11/27 1105) BP: 120/80 mmHg (11/27 1600) Pulse Rate: 81 (11/27 1600)  Labs:  Recent Labs  08/11/15 2300  08/12/15 0706 08/12/15 1217 08/12/15 1800  08/13/15 0302 08/13/15 1120 08/13/15 1657  HGB 15.8  --   --   --   --   --  13.7  --   --   HCT 43.8  --   --   --   --   --  40.2  --   --   PLT 208  --   --   --   --   --  176  --   --   LABPROT  --   --  14.7  --   --   --   --   --   --   INR  --   --  1.13  --   --   --   --   --   --   HEPARINUNFRC  --   --   --  <0.10*  --   < > <0.10* 0.37 <0.10*  CREATININE 0.65  --   --   --   --   --  0.70  --   --   TROPONINI <0.03  < > <0.03 <0.03 <0.03  --   --   --   --   < > = values in this interval not displayed.  Estimated Creatinine Clearance: 136.2 mL/min (by C-G formula based on Cr of 0.7).   Assessment: 54 y.o. male with chest pain. Pharmacy consulted to dose heparin.   HL at goal earlier today (0.3) now undetectable. Discussed with RN, no infusion issues noted and bag changed earlier today. Will rebolus and increase rate.  Goal of Therapy:  Heparin level 0.3-0.7 units/ml Monitor platelets by anticoagulation protocol: Yes   Plan:  Heparin bolus 2000 unitst Increase heparin top 2800 units/hr 6hr HL Monitor for s/s of bleeding   Erin Hearing PharmD., BCPS Clinical Pharmacist Pager [phone] 08/13/2015 6:14 PM

## 2015-08-14 ENCOUNTER — Encounter (HOSPITAL_COMMUNITY): Payer: Self-pay | Admitting: Interventional Cardiology

## 2015-08-14 ENCOUNTER — Encounter (HOSPITAL_COMMUNITY)
Admission: EM | Disposition: A | Discharge status: Home / Self Care | Payer: Self-pay | Source: Home / Self Care | Attending: Cardiovascular Disease

## 2015-08-14 DIAGNOSIS — E119 Type 2 diabetes mellitus without complications: Secondary | ICD-10-CM | POA: Insufficient documentation

## 2015-08-14 DIAGNOSIS — I25118 Atherosclerotic heart disease of native coronary artery with other forms of angina pectoris: Secondary | ICD-10-CM

## 2015-08-14 HISTORY — PX: CARDIAC CATHETERIZATION: SHX172

## 2015-08-14 LAB — CBC
HCT: 40 % (ref 39.0–52.0)
HCT: 41.1 % (ref 39.0–52.0)
Hemoglobin: 13.4 g/dL (ref 13.0–17.0)
Hemoglobin: 13.8 g/dL (ref 13.0–17.0)
MCH: 29.9 pg (ref 26.0–34.0)
MCH: 29.9 pg (ref 26.0–34.0)
MCHC: 33.5 g/dL (ref 30.0–36.0)
MCHC: 33.6 g/dL (ref 30.0–36.0)
MCV: 89.3 fL (ref 78.0–100.0)
MCV: 89 fL (ref 78.0–100.0)
Platelets: 162 10*3/uL (ref 150–400)
Platelets: 160 10*3/uL (ref 150–400)
RBC: 4.48 MIL/uL (ref 4.22–5.81)
RBC: 4.62 MIL/uL (ref 4.22–5.81)
RDW: 14.1 % (ref 11.5–15.5)
RDW: 14.3 % (ref 11.5–15.5)
WBC: 6.7 10*3/uL (ref 4.0–10.5)
WBC: 7.1 10*3/uL (ref 4.0–10.5)

## 2015-08-14 LAB — HEPARIN LEVEL (UNFRACTIONATED): Heparin Unfractionated: 0.48 IU/mL (ref 0.30–0.70)

## 2015-08-14 LAB — GLUCOSE, CAPILLARY
Glucose-Capillary: 108 mg/dL — ABNORMAL HIGH (ref 65–99)
Glucose-Capillary: 196 mg/dL — ABNORMAL HIGH (ref 65–99)
Glucose-Capillary: 71 mg/dL (ref 65–99)
Glucose-Capillary: 77 mg/dL (ref 65–99)
Glucose-Capillary: 78 mg/dL (ref 65–99)
Glucose-Capillary: 80 mg/dL (ref 65–99)
Glucose-Capillary: 84 mg/dL (ref 65–99)

## 2015-08-14 LAB — HEMOGLOBIN A1C
Hgb A1c MFr Bld: 7.9 % — ABNORMAL HIGH (ref 4.8–5.6)
Mean Plasma Glucose: 180 mg/dL

## 2015-08-14 LAB — CREATININE, SERUM
Creatinine, Ser: 0.54 mg/dL — ABNORMAL LOW (ref 0.61–1.24)
GFR calc Af Amer: >60 mL/min (ref >60)
GFR calc non Af Amer: >60 mL/min (ref >60)

## 2015-08-14 SURGERY — LEFT HEART CATH AND CORONARY ANGIOGRAPHY
Anesthesia: LOCAL

## 2015-08-14 MED ORDER — VERAPAMIL HCL 2.5 MG/ML IV SOLN
INTRAVENOUS | Status: AC
  Filled 2015-08-14: qty 2

## 2015-08-14 MED ORDER — SODIUM CHLORIDE 0.9 % IJ SOLN: 3.0000 mL | INTRAMUSCULAR | Status: DC | PRN

## 2015-08-14 MED ORDER — MIDAZOLAM HCL 2 MG/2ML IJ SOLN
INTRAMUSCULAR | Status: DC | PRN
  Administered 2015-08-14: 1 mg via INTRAVENOUS

## 2015-08-14 MED ORDER — HEPARIN SODIUM (PORCINE) 1000 UNIT/ML IJ SOLN
INTRAMUSCULAR | Status: AC
  Filled 2015-08-14: qty 1

## 2015-08-14 MED ORDER — FENTANYL CITRATE (PF) 100 MCG/2ML IJ SOLN
INTRAMUSCULAR | Status: AC
  Filled 2015-08-14: qty 2

## 2015-08-14 MED ORDER — ONDANSETRON HCL 4 MG/2ML IJ SOLN: 4.0000 mg | Freq: Four times a day (QID) | INTRAMUSCULAR | Status: DC | PRN

## 2015-08-14 MED ORDER — LIDOCAINE HCL (PF) 1 % IJ SOLN
INTRAMUSCULAR | Status: AC
  Filled 2015-08-14: qty 30

## 2015-08-14 MED ORDER — OXYCODONE-ACETAMINOPHEN 5-325 MG PO TABS: 1.0000 | ORAL_TABLET | ORAL | Status: DC | PRN

## 2015-08-14 MED ORDER — SODIUM CHLORIDE 0.9 % IV SOLN: 250.0000 mL | INTRAVENOUS | Status: DC | PRN

## 2015-08-14 MED ORDER — ACETAMINOPHEN 325 MG PO TABS: 650.0000 mg | ORAL_TABLET | ORAL | Status: DC | PRN

## 2015-08-14 MED ORDER — FENTANYL CITRATE (PF) 100 MCG/2ML IJ SOLN
INTRAMUSCULAR | Status: DC | PRN
  Administered 2015-08-14 (×2): 50 ug via INTRAVENOUS

## 2015-08-14 MED ORDER — MIDAZOLAM HCL 2 MG/2ML IJ SOLN
INTRAMUSCULAR | Status: AC
  Filled 2015-08-14: qty 2

## 2015-08-14 MED ORDER — HEPARIN (PORCINE) IN NACL 2-0.9 UNIT/ML-% IJ SOLN
INTRAMUSCULAR | Status: AC
  Filled 2015-08-14: qty 1000

## 2015-08-14 MED ORDER — VERAPAMIL HCL 2.5 MG/ML IV SOLN
INTRAVENOUS | Status: DC | PRN
  Administered 2015-08-14: 10:00:00 via INTRA_ARTERIAL

## 2015-08-14 MED ORDER — HEPARIN SODIUM (PORCINE) 1000 UNIT/ML IJ SOLN
INTRAMUSCULAR | Status: DC | PRN
  Administered 2015-08-14: 5000 [IU] via INTRAVENOUS

## 2015-08-14 MED ORDER — ENOXAPARIN SODIUM 40 MG/0.4ML ~~LOC~~ SOLN: 40.0000 mg | SUBCUTANEOUS | Status: DC

## 2015-08-14 MED ORDER — SODIUM CHLORIDE 0.9 % IJ SOLN: 3.0000 mL | Freq: Two times a day (BID) | INTRAMUSCULAR | Status: DC

## 2015-08-14 MED ORDER — IOHEXOL 350 MG/ML SOLN
INTRAVENOUS | Status: DC | PRN
  Administered 2015-08-14: 90 mL via INTRA_ARTERIAL

## 2015-08-14 MED ORDER — SODIUM CHLORIDE 0.9 % WEIGHT BASED INFUSION
1.0000 mL/kg/h | INTRAVENOUS | Status: AC
  Administered 2015-08-14: 1 mL/kg/h via INTRAVENOUS

## 2015-08-14 MED ORDER — FENOFIBRATE 160 MG PO TABS: 160.0000 mg | ORAL_TABLET | Freq: Every day | ORAL | Status: DC

## 2015-08-14 SURGICAL SUPPLY — 9 items
CATH INFINITI 5 FR JL3.5 (CATHETERS) ×2 IMPLANT
CATH INFINITI JR4 5F (CATHETERS) ×2 IMPLANT
DEVICE RAD COMP TR BAND LRG (VASCULAR PRODUCTS) ×2 IMPLANT
GLIDESHEATH SLEND A-KIT 6F 22G (SHEATH) ×2 IMPLANT
KIT HEART LEFT (KITS) ×2 IMPLANT
PACK CARDIAC CATHETERIZATION (CUSTOM PROCEDURE TRAY) ×2 IMPLANT
TRANSDUCER W/STOPCOCK (MISCELLANEOUS) ×2 IMPLANT
TUBING CIL FLEX 10 FLL-RA (TUBING) ×2 IMPLANT
WIRE SAFE-T 1.5MM-J .035X260CM (WIRE) ×2 IMPLANT

## 2015-08-14 NOTE — Care Management Note (Signed)
Case Management Note  Patient Details  Name: Alec [name] MRN: [id number] Date of Birth: 04/05/1961  Subjective/Objective: Pt admitted for acute chest pain. Plan for cardiac cath 08-14-15.                   Action/Plan: CM will continue to monitor for disposition needs.    Expected Discharge Date:                  Expected Discharge Plan:  Home/Self Care  In-House Referral:  NA  Discharge planning Services  CM Consult  Post Acute Care Choice:    Choice offered to:     DME Arranged:    DME Agency:     HH Arranged:    HH Agency:     Status of Service:  In process, will continue to follow  Medicare Important Message Given:    Date Medicare IM Given:    Medicare IM give by:    Date Additional Medicare IM Given:    Additional Medicare Important Message give by:     If discussed at [place] of Stay Meetings, dates discussed:    Additional Comments:  Bethena Roys, RN 08/14/2015, 10:16 AM

## 2015-08-14 NOTE — Interval H&P Note (Signed)
History and Physical Interval Note:  08/14/2015 10:40 AM  Alec [name]  has presented today for surgery, with the diagnosis of unstable angina  The various methods of treatment have been discussed with the patient and family. After consideration of risks, benefits and other options for treatment, the patient has consented to  Procedure(s): Left Heart Cath and Coronary Angiography (N/A) as a surgical intervention .  The patient's history has been reviewed, patient examined, no change in status, stable for surgery.  I have reviewed the patient's chart and labs.  Questions were answered to the patient's satisfaction.     [name]

## 2015-08-14 NOTE — Discharge Summary (Signed)
Discharge Summary   Patient ID: Alec [name],  MRN: [id number], DOB/AGE: 1961/05/05 54 y.o.  Admit date: 08/11/2015 Discharge date: 08/14/2015  Primary Care Provider: Sheela Stack Primary Cardiologist: Dr. Tamala Julian  Discharge Diagnoses Principal Problem:   Unstable angina [facility]) Active Problems:   Type 2 diabetes mellitus ([place])   Mixed hyperlipidemia   CAD (coronary artery disease)   Depression   Gout   Acute chest pain   Allergies Allergies  Allergen Reactions  . Percocet [Oxycodone-Acetaminophen] Nausea Only    Makes patient feel not with it    Procedures  Echocardiogram 08/12/2015 LV EF: 60% -  65%  ------------------------------------------------------------------- Indications:   Chest pain 786.51.  ------------------------------------------------------------------- History:  PMH:  Coronary artery disease. Risk factors: Diabetes mellitus. Dyslipidemia.  ------------------------------------------------------------------- Study Conclusions  - Left ventricle: The cavity size was normal. Wall thickness was increased in a pattern of mild LVH. Systolic function was normal. The estimated ejection fraction was in the range of 60% to 65%. Wall motion was normal; there were no regional wall motion abnormalities. Doppler parameters are consistent with abnormal left ventricular relaxation (grade 1 diastolic dysfunction). - Aortic valve: Mildly calcified annulus. Trileaflet; mildly thickened leaflets. Valve area (VTI): 3.78 cm^2. Valve area (Vmax): 3.61 cm^2. - Technically difficult study. Echocontrast was used to enhance visualization.     Cardiac catheterization 08/14/2015 onclusion     Mild luminal irregularities are noted in the right coronary, and circumflex.  Widely patent proximal and distal RCA stents placed in August 2015.  Normal LV function with elevated end-diastolic pressure.  Prolonged chest discomfort, greater than  24 hours without EKG changes or enzyme elevation suggests alternative explanation than ischemia/infarction.   RECOMMENDATIONS:   Consider alternative explanations for chest discomfort. It is conceivable that the patient's initial presentation in August 2015 was noncardiac. The right coronary was stented after FFR in the setting of chest pain that was interpreted as angina.      Hospital Course  54 yo male with h/o CAD s/p DES to [facility] 04/27/2014, HLD with severe hypertriglyceridemia causing prior pancreatitis, DM, and gout who transferred to [facility] on 08/12/2015 with 2 hours of constant CP that is reminescent of prior angina. It woke him up from sleep around 20:30. It also radiated down his left arm. He had associated symptoms of diaphoresis, dyspnea, lightheadedness and nausea. IV heparin was started. The patient was admitted to cardiology service. After discussing various options, the patient agreed to undergo diagnostic cardiac catheterization. His amylase and lipase were normal. Echocardiogram was obtained on 08/12/2015 which showed EF 123456, grade 1 diastolic dysfunction, no RWMA.   He underwent a scheduled cardiac catheterization on 08/06/2015 which showed mild luminal irregularities noted in right coronary artery and the circumflex, widely patent stent in proximal and distal RCA, normal LV function was elevated LVEDP. He was seen post cath at which time he has 2+ radial pulse distal to the cath site, he is deemed stable for discharge from cardiology perspective. Outpatient followup arranged. Of note, during this admission Ramipril and amlodipine was held. I have hesitant to restart as his SBP is normal at this time. He will need to recheck his BP on followup to see if he need to resume the ACEI and amlodipine. I have given his 6 month of finofibrate as his triglyceride is too high 2237 and he has run out of his finofibrate. His A1C is 7.9. He will need close outpatient followup with Dr.  Forde Dandy.   Discharge Vitals Blood pressure 131/86, pulse 73,  temperature 97.5 F (36.4 C), temperature source Oral, resp. rate 14, height 5\' 10"  (1.778 m), weight 252 lb 8 oz (114.533 kg), SpO2 94 %.  Filed Weights   08/12/15 0420 08/13/15 0351 08/14/15 0444  Weight: 257 lb 8 oz (116.801 kg) 255 lb 9.6 oz (115.939 kg) 252 lb 8 oz (114.533 kg)    Labs  CBC  Recent Labs  08/14/15 0021 08/14/15 1220  WBC 6.7 7.1  HGB 13.4 13.8  HCT 40.0 41.1  MCV 89.3 89.0  PLT 162 0000000   Basic Metabolic Panel  Recent Labs  08/11/15 2300 08/13/15 0302 08/14/15 1220  NA 134* 135  --   K 3.9 3.9  --   CL 101 100*  --   CO2 26 28  --   GLUCOSE 180* 170*  --   BUN 16 10  --   CREATININE 0.65 0.70 0.54*  CALCIUM 8.9 8.5*  --    Liver Function Tests  Recent Labs  08/11/15 2300  AST 21  ALT 17  ALKPHOS 75  BILITOT 0.4  PROT 6.7  ALBUMIN 3.5    Recent Labs  08/12/15 1217  LIPASE 22  AMYLASE 33   Cardiac Enzymes  Recent Labs  08/12/15 0706 08/12/15 1217 08/12/15 1800  TROPONINI <0.03 <0.03 <0.03   Hemoglobin A1C  Recent Labs  08/12/15 0706  HGBA1C 7.9*   Fasting Lipid Panel  Recent Labs  08/12/15 0706  CHOL 288*  HDL NOT REPORTED DUE TO HIGH TRIGLYCERIDES  LDLCALC UNABLE TO CALCULATE IF TRIGLYCERIDE OVER 400 mg/dL  TRIG 2237*  CHOLHDL NOT REPORTED DUE TO HIGH TRIGLYCERIDES    Disposition  Pt is being discharged home today in good condition.  Follow-up Plans & Appointments      Follow-up Information    Follow up with Eileen Stanford, PA-C On 09/04/2015.   Specialties:  Cardiology, Radiology   Why:  10:30am. Cardiology followup   Contact information:   [address] [place] [id number] [phone]       Schedule an appointment as soon as possible for a visit with Sheela Stack, MD.   Specialty:  Endocrinology   Why:  Please followup with Dr. Forde Dandy closely for hyperlipidemia and diabetes   Contact information:    [address] [place] [address] [phone]       Discharge Medications    Medication List    STOP taking these medications        amLODipine 10 MG tablet  Commonly known as:  NORVASC     ramipril 10 MG capsule  Commonly known as:  ALTACE      TAKE these medications        allopurinol 300 MG tablet  Commonly known as:  ZYLOPRIM  Take 1 tablet (300 mg total) by mouth daily.     aspirin EC 81 MG tablet  Take 81 mg by mouth daily.     atorvastatin 80 MG tablet  Commonly known as:  LIPITOR  Take 80 mg by mouth at bedtime.     cetirizine 10 MG tablet  Commonly known as:  ZYRTEC  Take 10 mg by mouth daily.     escitalopram 20 MG tablet  Commonly known as:  LEXAPRO  TAKE 1 TABLET (20 MG) BY MOUTH DAILY.     fenofibrate 160 MG tablet  Take 1 tablet (160 mg total) by mouth daily.     HUMULIN R 500 UNIT/ML injection  Generic drug:  insulin regular human  CONCENTRATED  Inject into the skin 4 (four) times daily. 1.25 units per hour, bolus as needed     HYDROcodone-acetaminophen 10-325 MG tablet  Commonly known as:  NORCO  Take 1 tablet by mouth every 6 (six) hours as needed for moderate pain or severe pain.     JARDIANCE 25 MG Tabs tablet  Generic drug:  empagliflozin  Take 25 mg by mouth daily.     metoprolol succinate 25 MG 24 hr tablet  Commonly known as:  TOPROL-XL  Take 25 mg by mouth daily.     montelukast 10 MG tablet  Commonly known as:  SINGULAIR  Take 10 mg by mouth at bedtime.     nitroGLYCERIN 0.4 MG SL tablet  Commonly known as:  NITROSTAT  Place 1 tablet (0.4 mg total) under the tongue every 5 (five) minutes x 3 doses as needed for chest pain.     prasugrel 10 MG Tabs tablet  Commonly known as:  EFFIENT  Take 1 tablet (10 mg total) by mouth daily.     VASCEPA 1 G Caps  Generic drug:  Icosapent Ethyl  Take 2 g by mouth 2 (two) times daily.       Duration of Discharge Encounter   Greater than 30 minutes including physician  time.  Hilbert Corrigan PA-C Pager: [phone] 08/14/2015, 2:49 PM

## 2015-08-14 NOTE — H&P (View-Only) (Signed)
Patient ID: Alec [name], male   DOB: 05/26/1961, 54 y.o.   MRN: [id number]    Subjective:  Still feels poorly with chest tightness   Objective:  Filed Vitals:   08/13/15 0031 08/13/15 0332 08/13/15 0351 08/13/15 0708  BP: 110/70 124/56    Pulse: 86 80    Temp: 98 F (36.7 C)  97.6 F (36.4 C) 98.3 F (36.8 C)  TempSrc: Oral  Oral Oral  Resp: 16 17    Height:      Weight:   115.939 kg (255 lb 9.6 oz)   SpO2: 95% 96%      Intake/Output from previous day:  Intake/Output Summary (Last 24 hours) at 08/13/15 0850 Last data filed at 08/13/15 0820  Gross per 24 hour  Intake 1758.73 ml  Output   1750 ml  Net   8.73 ml    Physical Exam: Affect appropriate Overweight white male  HEENT: normal Neck supple with no adenopathy JVP normal no bruits no thyromegaly Lungs clear with no wheezing and good diaphragmatic motion Heart:  S1/S2 no murmur, no rub, gallop or click PMI normal Abdomen: benighn, BS positve, no tenderness, no AAA no bruit.  No HSM or HJR Distal pulses intact with no bruits No edema Neuro non-focal Skin warm and dry No muscular weakness   Lab Results: Basic Metabolic Panel:  Recent Labs  08/11/15 2300 08/13/15 0302  NA 134* 135  K 3.9 3.9  CL 101 100*  CO2 26 28  GLUCOSE 180* 170*  BUN 16 10  CREATININE 0.65 0.70  CALCIUM 8.9 8.5*   Liver Function Tests:  Recent Labs  08/11/15 2300  AST 21  ALT 17  ALKPHOS 75  BILITOT 0.4  PROT 6.7  ALBUMIN 3.5   CBC:  Recent Labs  08/11/15 2300 08/13/15 0302  WBC 8.5 9.4  HGB 15.8 13.7  HCT 43.8 40.2  MCV 86.4 88.5  PLT 208 176   Cardiac Enzymes:  Recent Labs  08/12/15 0706 08/12/15 1217 08/12/15 1800  TROPONINI <0.03 <0.03 <0.03   Fasting Lipid Panel:  Recent Labs  08/12/15 0706  CHOL 288*  HDL NOT REPORTED DUE TO HIGH TRIGLYCERIDES  LDLCALC UNABLE TO CALCULATE IF TRIGLYCERIDE OVER 400 mg/dL  TRIG 2237*  CHOLHDL NOT REPORTED DUE TO HIGH TRIGLYCERIDES    Imaging: Dg  Chest 2 View  08/11/2015  CLINICAL DATA:  Chest pain EXAM: CHEST  2 VIEW COMPARISON:  04/26/2014 FINDINGS: Cardiomediastinal silhouette is stable. Mild elevation of the right hemidiaphragm again noted. No acute infiltrate or pleural effusion. No pulmonary edema. Stable degenerative changes thoracic spine. IMPRESSION: No active cardiopulmonary disease. Electronically Signed   By: Lahoma Crocker M.D.   On: 08/11/2015 23:58    Cardiac Studies:  ECG:  NSR no acute St/T wave changes    Telemetry:  NSR no arrhythmia   Echo:  11/26  EF 60-65% no RWMA  Medications:   . allopurinol  300 mg Oral Daily  . aspirin  81 mg Oral Pre-Cath  . aspirin EC  81 mg Oral Daily  . atorvastatin  80 mg Oral q1800  . fenofibrate  160 mg Oral Daily  . insulin pump   Subcutaneous 6 times per day  . loratadine  10 mg Oral Daily  . metoprolol tartrate  12.5 mg Oral BID  . omega-3 acid ethyl esters  2 g Oral BID WC  . prasugrel  10 mg Oral Daily  . sodium chloride  3 mL Intravenous Q12H  .  sodium chloride  3 mL Intravenous Q12H     . sodium chloride    . heparin 2,500 Units/hr (08/13/15 0342)  . nitroGLYCERIN 25 mcg/min (08/12/15 [id number])    Assessment/Plan:  Chest Pain:  Known CAD with stenting distal RCA 04/2014  R/O no acute ECG changes continue heparin and nitro Cath on Monday orders written palced on board Review of echo shows normal EF and no discrete RWMA  DM:  2000 cal ADA diet and insulin pump  Chol:  Had run out of fenofibrate at risk for pancreatitis will check amylase and lipase  Jenkins Rouge 08/13/2015, 8:50 AM

## 2015-08-14 NOTE — Plan of Care (Signed)
Problem: Health Behavior/Discharge Planning: Goal: Ability to manage health-related needs will improve Outcome: Progressing Patient demonstrates his ability to manage his care by effectively managing his home insulin pump.

## 2015-08-14 NOTE — Discharge Instructions (Signed)
No driving for 24 hours. No lifting over 5 lbs for 1 week. No sexual activity for 1 week. Keep procedure site clean & dry. If you notice increased pain, swelling, bleeding or pus, call/return!  You may shower, but no soaking baths/hot tubs/pools for 1 week.    Chest Pain Observation It is often hard to give a specific diagnosis for the cause of chest pain. Among other possibilities your symptoms might be caused by inadequate oxygen delivery to your heart (angina). Angina that is not treated or evaluated can lead to a heart attack (myocardial infarction) or death. Blood tests, electrocardiograms, and X-rays may have been done to help determine a possible cause of your chest pain. After evaluation and observation, your health care provider has determined that it is unlikely your pain was caused by an unstable condition that requires hospitalization. However, a full evaluation of your pain may need to be completed, with additional diagnostic testing as directed. It is very important to keep your follow-up appointments. Not keeping your follow-up appointments could result in permanent heart damage, disability, or death. If there is any problem keeping your follow-up appointments, you must call your health care provider. HOME CARE INSTRUCTIONS  Due to the slight chance that your pain could be angina, it is important to follow your health care provider's treatment plan and also maintain a healthy lifestyle:  Maintain or work toward achieving a healthy weight.  Stay physically active and exercise regularly.  Decrease your salt intake.  Eat a balanced, healthy diet. Talk to a dietitian to learn about heart-healthy foods.  Increase your fiber intake by including whole grains, vegetables, fruits, and nuts in your diet.  Avoid situations that cause stress, anger, or depression.  Take medicines as advised by your health care provider. Report any side effects to your health care provider. Do not stop  medicines or adjust the dosages on your own.  Quit smoking. Do not use nicotine patches or gum until you check with your health care provider.  Keep your blood pressure, blood sugar, and cholesterol levels within normal limits.  Limit alcohol intake to no more than 1 drink per day for women who are not pregnant and 2 drinks per day for men.  Do not abuse drugs. SEEK IMMEDIATE MEDICAL CARE IF: You have severe chest pain or pressure which may include symptoms such as:  You feel pain or pressure in your arms, neck, jaw, or back.  You have severe back or abdominal pain, feel sick to your stomach (nauseous), or throw up (vomit).  You are sweating profusely.  You are having a fast or irregular heartbeat.  You feel short of breath while at rest.  You notice increasing shortness of breath during rest, sleep, or with activity.  You have chest pain that does not get better after rest or after taking your usual medicine.  You wake from sleep with chest pain.  You are unable to sleep because you cannot breathe.  You develop a frequent cough or you are coughing up blood.  You feel dizzy, faint, or experience extreme fatigue.  You develop severe weakness, dizziness, fainting, or chills. Any of these symptoms may represent a serious problem that is an emergency. Do not wait to see if the symptoms will go away. Call your local emergency services (911 in the [place] Do not drive yourself to the hospital. MAKE SURE YOU:  Understand these instructions.  Will watch your condition.  Will get help right away if you are not  doing well or get worse.   This information is not intended to replace advice given to you by your health care provider. Make sure you discuss any questions you have with your health care provider.   Document Released: 10/05/2010 Document Revised: 09/07/2013 Document Reviewed: 03/04/2013 Elsevier Interactive Patient Education [employer].

## 2015-08-14 NOTE — Progress Notes (Signed)
Patient being discharged home per MD order. All instructions given to patient.

## 2015-08-14 NOTE — Progress Notes (Signed)
UR Completed Maurie Olesen Graves-Bigelow, RN,BSN [phone]  

## 2015-08-14 NOTE — Progress Notes (Signed)
ANTICOAGULATION CONSULT NOTE - Initial Consult  Pharmacy Consult for Heparin  Indication: chest pain/ACS  Allergies  Allergen Reactions  . Percocet [Oxycodone-Acetaminophen] Nausea Only    Makes patient feel not with it    Patient Measurements: Height: 5\' 10"  (177.8 cm) Weight: 255 lb 9.6 oz (115.939 kg) IBW/kg (Calculated) : 73  Vital Signs: Temp: 97.9 F (36.6 C) (11/27 2358) Temp Source: Oral (11/27 2358) BP: 124/58 mmHg (11/27 2358) Pulse Rate: 71 (11/27 2358)  Labs:  Recent Labs  08/11/15 2300  08/12/15 0706 08/12/15 1217 08/12/15 1800  08/13/15 0302 08/13/15 1120 08/13/15 1657 08/14/15 0021  HGB 15.8  --   --   --   --   --  13.7  --   --   --   HCT 43.8  --   --   --   --   --  40.2  --   --   --   PLT 208  --   --   --   --   --  176  --   --   --   LABPROT  --   --  14.7  --   --   --   --   --   --   --   INR  --   --  1.13  --   --   --   --   --   --   --   HEPARINUNFRC  --   --   --  <0.10*  --   < > <0.10* 0.37 <0.10* 0.48  CREATININE 0.65  --   --   --   --   --  0.70  --   --   --   TROPONINI <0.03  < > <0.03 <0.03 <0.03  --   --   --   --   --   < > = values in this interval not displayed.  Estimated Creatinine Clearance: 136.2 mL/min (by C-G formula based on Cr of 0.7).   Medical History: Past Medical History  Diagnosis Date  . Mixed hyperlipidemia   . Metabolic syndrome   . History of pancreatitis   . Arthritis   . Chronic lower back pain   . Gout   . Depression   . History of echocardiogram     a. 04/27/14 with EF 65-70% and mild TR.  . Type 2 diabetes mellitus (HCC)     on Insulin pump  . CAD (coronary artery disease)     a. 04/27/14 DES to dRCA; widely patent L coronary system    Assessment: 54 y/o M with hx of cardiac stenting, here with CP, troponin is negative, heparin level is therapeutic after rate increase  Goal of Therapy:  Heparin level 0.3-0.7 units/ml Monitor platelets by anticoagulation protocol: Yes   Plan:   -Cont heparin drip at 2800 units/hr -HL with AM labs to confirm -Daily CBC/HL -Monitor for bleeding  Narda Bonds 08/14/2015,12:50 AM

## 2015-08-15 ENCOUNTER — Telehealth: Payer: Self-pay | Admitting: Cardiovascular Disease

## 2015-08-15 ENCOUNTER — Other Ambulatory Visit: Payer: Self-pay | Admitting: Physician Assistant

## 2015-08-15 NOTE — Telephone Encounter (Signed)
D/ C phone call .Marland Kitchen Appt is on 09/04/15 at 10:30 am w/ Angelena Form at the [facility] .Marland Kitchen  Thanks

## 2015-08-16 NOTE — Telephone Encounter (Signed)
Patient contacted regarding discharge from [facility] on 08/14/15.  Patient understands to follow up with provider Angelena Form on 09/04/15 at 10:30 am at [facility]. Patient understands discharge instructions? yes  Patient understands medications and regiment? yes  Patient understands to bring all medications to this visit? yes

## 2015-09-03 NOTE — Progress Notes (Signed)
entered in error   

## 2015-09-04 ENCOUNTER — Encounter: Payer: Commercial Managed Care - HMO | Admitting: Physician Assistant

## 2015-09-04 DIAGNOSIS — R0989 Other specified symptoms and signs involving the circulatory and respiratory systems: Secondary | ICD-10-CM

## 2015-09-19 ENCOUNTER — Other Ambulatory Visit: Payer: Self-pay | Admitting: Interventional Cardiology

## 2015-09-19 MED FILL — HUMULIN R 500 UNITS/ML VIAL: 500 | 40 days supply | Qty: 20 | Fill #6

## 2015-09-19 MED FILL — FENOFIBRATE 160 MG TABLET: 160 | 30 days supply | Qty: 30 | Fill #1

## 2015-09-19 MED FILL — MONTELUKAST SOD 10 MG TAB: 10 | 30 days supply | Qty: 30 | Fill #5

## 2015-09-19 MED FILL — ESCITALOPRAM 20 MG TABLET: 20 | 30 days supply | Qty: 30 | Fill #1

## 2015-09-19 MED FILL — ALLOPURINOL 300 MG TABLET: 300 | 30 days supply | Qty: 30 | Fill #3

## 2015-09-19 MED FILL — METOPROLOL SUCC ER 25 MG TA: 25 | 30 days supply | Qty: 30 | Fill #2

## 2015-09-19 MED FILL — EFFIENT 10 MG TABLET: 10 | 30 days supply | Qty: 30 | Fill #0

## 2015-09-19 MED FILL — VASCEPA 1 GM CAPSULE: 1 | 30 days supply | Qty: 120 | Fill #3

## 2015-09-19 MED FILL — RAMIPRIL 10 MG CAPSULE: 10 | 30 days supply | Qty: 30 | Fill #3

## 2015-09-20 MED FILL — JARDIANCE 25 MG TABLET: 25 | 30 days supply | Qty: 30 | Fill #2

## 2015-09-21 NOTE — Progress Notes (Addendum)
Cardiology Office Note   Date:  09/22/2015   ID:  Alec [name], DOB Oct 03, 1960, MRN [id number]  PCP:  Sheela Stack, MD  Cardiologist:   Dr. Fransisca Kaufmann hospital f/u- CAD    History of Present Illness: Alec [name] is a 55 yo male with h/o CAD s/p DES x2 (in tandem) to [place] 04/27/2014, HTN, HLD with severe hypertriglyceridemia causing prior pancreatitis, DM, and gout who presents to clinic for post hospital follow up.   He was recently admitted to [facility] from 11/25-11/28/16 for chest pain. He had 2 hours of constant CP that was reminiscent of prior angina. He was admitted. His amylase and lipase were normal. Echocardiogram was obtained on 08/12/2015 which showed EF [id number], grade 1 diastolic dysfunction, no RWMA. He underwent cardiac catheterization on 08/06/2015 which showed mild luminal irregularities noted in RCA and the circumflex, widely patent stent in proximal and distal RCA, normal LV function with elevated LVEDP. His chest pain was felt to be non cardiac ? GI or MSK. Of note, during this admission Ramipril and amlodipine were held and not restarted at discharge as his BP was normal at the time. He was given his 6 month of fenofibrate as his triglyceride was too high at 2237 and he had run out. His A1C is 7.9. He will need close outpatient follow up with Dr. Forde Dandy.   Today he presents for follow up. No more chest pain or SOB. In retrospect, he thinks the chest pain was due to anxiety due to family problems (fights with mother and father recently passed away).  He does have some mild LE edema that is chronic. No orthopnea but he does feel like he sometimes wakes up gasping for air. However, his wife thinks that he does have sleep apnea. He does admit to daytime somnolence . No dizziness or syncope. No blood in stool or urine. Taking all of his medications and feeling well.    Past Medical History  Diagnosis Date  . Mixed hyperlipidemia   . Metabolic syndrome   . History of  pancreatitis   . Arthritis   . Chronic lower back pain   . Gout   . Depression   . History of echocardiogram     a. 04/27/14 with EF 65-70% and mild TR.  . Type 2 diabetes mellitus (HCC)     on Insulin pump  . CAD (coronary artery disease)     a. 04/27/14 DES to dRCA; widely patent L coronary system b. cath 08/13/2015 no changes, patent RCA    Past Surgical History  Procedure Laterality Date  . Carpal tunnel release Bilateral 2006-2008    left 12/2004; right 10/2006  . Shoulder arthroscopy Right ~ 2010  . Back surgery    . Knee arthroscopy Bilateral 2000's    right 08/2003;   . Posterior lumbar fusion  07/2009  . Lumbar laminectomy Left 05/2006  . Functional endoscopic sinus surgery  10/2001; 08/2005;   . Left heart catheterization with coronary angiogram N/A 04/27/2014    Procedure: LEFT HEART CATHETERIZATION WITH CORONARY ANGIOGRAM;  Surgeon: [name], MD;  Location: [facility] CATH LAB;  Service: Cardiovascular;  Laterality: N/A;  . Percutaneous stent intervention  04/27/2014    Procedure: PERCUTANEOUS STENT INTERVENTION;  Surgeon: [name], MD;  Location: [facility] CATH LAB;  Service: Cardiovascular;;  DES x2 (distal +prox RCA)  . Knee arthroscopy with medial menisectomy Left 04/20/2015    Procedure: LEFT ARTHROSCOPY KNEE WITH MEDIAL MENISECTOMY;  Surgeon: Melrose Nakayama, MD;  Location: [place];  Service: Orthopedics;  Laterality: Left;  Partial medial menisectomy, chondroplasty.  . Chondroplasty  04/20/2015    Procedure: CHONDROPLASTY;  Surgeon: Melrose Nakayama, MD;  Location: [place];  Service: Orthopedics;;  . Cardiac catheterization N/A 08/14/2015    Procedure: Left Heart Cath and Coronary Angiography;  Surgeon: Belva Crome, MD;  Location: [place] CV LAB;  Service: Cardiovascular;  Laterality: N/A;     Current Outpatient Prescriptions  Medication Sig Dispense Refill  . allopurinol (ZYLOPRIM) 300 MG tablet Take 1 tablet (300 mg total) by  mouth daily. 30 tablet 11  . aspirin EC 81 MG tablet Take 81 mg by mouth daily.    Marland Kitchen atorvastatin (LIPITOR) 80 MG tablet Take 80 mg by mouth at bedtime.  8  . cetirizine (ZYRTEC) 10 MG tablet Take 10 mg by mouth daily.    Marland Kitchen EFFIENT 10 MG TABS tablet TAKE 1 TABLET (10 MG) BY MOUTH DAILY. 30 tablet 11  . Empagliflozin (JARDIANCE) 25 MG TABS Take 25 mg by mouth daily.    Marland Kitchen escitalopram (LEXAPRO) 20 MG tablet TAKE 1 TABLET (20 MG) BY MOUTH DAILY. 30 tablet 0  . fenofibrate 160 MG tablet Take 1 tablet (160 mg total) by mouth daily. 90 tablet 1  . HYDROcodone-acetaminophen (NORCO) 10-325 MG tablet Take 1 tablet by mouth every 6 (six) hours as needed for moderate pain or severe pain.   0  . insulin regular human CONCENTRATED (HUMULIN R) 500 UNIT/ML SOLN injection Inject 500 Units into the skin 4 (four) times daily. 1.25 units per hour, bolus as needed blood suger    . metoprolol succinate (TOPROL-XL) 25 MG 24 hr tablet Take 25 mg by mouth daily.  7  . montelukast (SINGULAIR) 10 MG tablet Take 10 mg by mouth at bedtime.  5  . NITROSTAT 0.4 MG SL tablet PLACE 1 TABLET (0.4 MG TOTAL) UNDER THE TONGUE EVERY 5 (FIVE) MINUTES X 3 DOSES AS NEEDED FOR CHEST PAIN. DIAL 911 AFTER 3 DOSES 25 tablet 1  . ONE TOUCH ULTRA TEST test strip 1 each by Other route as needed for other (blood sugar).   6  . ramipril (ALTACE) 10 MG capsule Take 10 mg by mouth daily.   6  . VASCEPA 1 G CAPS Take 2 g by mouth 2 (two) times daily.      No current facility-administered medications for this visit.    Allergies:   Percocet    Social History:  The patient  reports that he has never smoked. He has never used smokeless tobacco. He reports that he drinks alcohol. He reports that he does not use illicit drugs.   Family History:  The patient's family history includes Coronary artery disease in his mother; Heart attack in his mother.    ROS:  Please see the history of present illness.   Otherwise, review of systems are positive  for none.   All other systems are reviewed and negative.    PHYSICAL EXAM: VS:  BP 124/80 mmHg  Pulse 78  Ht 5' 10.5" (1.791 m)  Wt 262 lb (118.842 kg)  BMI 37.05 kg/m2 , BMI Body mass index is 37.05 kg/(m^2). GEN: Well nourished, well developed, in no acute distress HEENT: normal Neck: no JVD, carotid bruits, or masses Cardiac: RRR; no murmurs, rubs, or gallops,no edema  Respiratory:  clear to auscultation bilaterally, normal work of breathing GI: soft, nontender, nondistended, + BS MS: no deformity  or atrophy Skin: warm and dry, no rash Neuro:  Strength and sensation are intact Psych: euthymic mood, full affect   EKG:  EKG is not ordered today.   Recent Labs: 08/11/2015: ALT 17 08/13/2015: BUN 10; Potassium 3.9; Sodium 135 08/14/2015: Creatinine, Ser 0.54*; Hemoglobin 13.8; Platelets 160    Lipid Panel    Component Value Date/Time   CHOL 288* 08/12/2015 0706   TRIG 2237* 08/12/2015 0706   HDL NOT REPORTED DUE TO HIGH TRIGLYCERIDES 08/12/2015 0706   CHOLHDL NOT REPORTED DUE TO HIGH TRIGLYCERIDES 08/12/2015 0706   VLDL UNABLE TO CALCULATE IF TRIGLYCERIDE OVER 400 mg/dL 08/12/2015 0706   LDLCALC UNABLE TO CALCULATE IF TRIGLYCERIDE OVER 400 mg/dL 08/12/2015 0706      Wt Readings from Last 3 Encounters:  09/22/15 262 lb (118.842 kg)  08/14/15 252 lb 8 oz (114.533 kg)  04/20/15 250 lb 6 oz (113.569 kg)      Other studies Reviewed: Additional studies/ records that were reviewed today include: 2D ECHO, LHC  Review of the above records demonstrates:   Echocardiogram 08/12/2015  LV EF: 60% -  65%  Study Conclusions  - Left ventricle: The cavity size was normal. Wall thickness was  increased in a pattern of mild LVH. Systolic function was normal.  The estimated ejection fraction was in the range of 60% to 65%.  Wall motion was normal; there were no regional wall motion  abnormalities. Doppler parameters are consistent with abnormal  left ventricular  relaxation (grade 1 diastolic dysfunction).  - Aortic valve: Mildly calcified annulus. Trileaflet; mildly  thickened leaflets. Valve area (VTI): 3.78 cm^2. Valve area  (Vmax): 3.61 cm^2.  - Technically difficult study. Echocontrast was used to enhance  visualization.     Cardiac catheterization 08/14/2015  Conclusion:     Mild luminal irregularities are noted in the right coronary, and circumflex.    Widely patent proximal and distal RCA stents placed in August 2015.    Normal LV function with elevated end-diastolic pressure.    Prolonged chest discomfort, greater than 24 hours without EKG changes or enzyme elevation suggests alternative explanation than ischemia/infarction.  RECOMMENDATIONS:    Consider alternative explanations for chest discomfort. It is conceivable that the patient's initial presentation in August 2015 was noncardiac. The right coronary was stented after FFR in the setting of chest pain that was interpreted as angina.     ASSESSMENT AND PLAN:  Alec Lybeck is a 55 yo male with h/o CAD s/p DES to [place] 04/27/2014, HTN, HLD with severe hypertriglyceridemia causing prior pancreatitis, DM, and gout who presents to clinic for post hospital follow up.   Chest pain/CAD s/p DES to dRCA 04/2014: recent LHC with patent stents with no potential culprit lesions. Chest pain felt to be MSK or GI. Continue ASA/effient, statin and BB.   HTN: well controlled on Ramipril 10mg  and Toprol XL 25mg . BP 124/80 today.   HLD with severe hypertriglyceridemia: started back on fenofibrate 160mg  daily and Vascepa 4g daily at discharge ( had run out). Encouraged compliance with this as TG >2000 and he has a hx of hypertriglyceridemia induced pancreatitis. Continue atorvastatin 80mg  daily. I will get repeat fasting lipids next week and direct LDL. Dr. Forde Dandy had mentioned PCSK 9 inhibitors to him. If his LDL is still elevated, I will have him set up in the lipid clinic. He may need  to be trialed on Zetia first.   DM: HGA1c 7.9. follow up with Dr. Forde Dandy his PCP.   Possible sleep  apnea: his wife complains of him snoring and witnessed apneic episodes. He has daytime somnolence with trouble staying awake while watching a TV show. Will get a sleep study set up.   Current medicines are reviewed at length with the patient today.  The patient does not have concerns regarding medicines.  The following changes have been made:  no change  Labs/ tests ordered today include:   Orders Placed This Encounter  Procedures  . Lipid Profile  . Direct LDL  . Split night study     Disposition:   FU with Dr. Tamala Julian in 4-6 months.  Renea Ee  09/22/2015 3:56 PM    [facility] [place], [place], [place]  [address] Phone: [phone]; Fax: [phone]

## 2015-09-22 ENCOUNTER — Ambulatory Visit (INDEPENDENT_AMBULATORY_CARE_PROVIDER_SITE_OTHER): Payer: 59 | Admitting: Physician Assistant

## 2015-09-22 ENCOUNTER — Encounter: Payer: Self-pay | Admitting: Physician Assistant

## 2015-09-22 VITALS — BP 124/80 | HR 78 | Ht 70.5 in | Wt 262.0 lb

## 2015-09-22 DIAGNOSIS — R0602 Shortness of breath: Secondary | ICD-10-CM

## 2015-09-22 DIAGNOSIS — E781 Pure hyperglyceridemia: Secondary | ICD-10-CM

## 2015-09-22 DIAGNOSIS — Z79899 Other long term (current) drug therapy: Secondary | ICD-10-CM

## 2015-09-22 DIAGNOSIS — R0683 Snoring: Secondary | ICD-10-CM | POA: Diagnosis not present

## 2015-09-22 MED FILL — HYDROCODON-APAP 10-325: 10-325 | 30 days supply | Qty: 120 | Fill #0

## 2015-09-22 NOTE — Patient Instructions (Addendum)
Medication Instructions:  Your physician recommends that you continue on your current medications as directed. Please refer to the Current Medication list given to you today.  Labwork: Your physician recommends that you return for lab work in: 1 week with fasting lipid panel and Direct LDL  Testing/Procedures: Your physician has recommended that you have a sleep study. This test records several body functions during sleep, including: brain activity, eye movement, oxygen and carbon dioxide blood levels, heart rate and rhythm, breathing rate and rhythm, the flow of air through your mouth and nose, snoring, body muscle movements, and chest and belly movement.  Follow-Up: Your physician wants you to follow-up in: 3 to 4  month with Dr. Tamala Julian.   If you need a refill on your cardiac medications before your next appointment, please call your pharmacy.

## 2015-10-13 LAB — LIPID PANEL
Cholesterol: 349 mg/dL — ABNORMAL HIGH (ref 125–200)
HDL: 27 mg/dL — ABNORMAL LOW (ref >=40)
Total CHOL/HDL Ratio: 12.9 Ratio — ABNORMAL HIGH (ref <=5.0)
Triglycerides: 2598 mg/dL — ABNORMAL HIGH (ref <150)

## 2015-10-13 LAB — LDL CHOLESTEROL, DIRECT: Direct LDL: 65 mg/dL (ref <130)

## 2015-10-20 MED FILL — HYDROCODON-APAP 10-325: 10-325 | 30 days supply | Qty: 120 | Fill #0

## 2015-10-20 MED FILL — ATORVASTATIN 80 MG TABLET: 80 | 30 days supply | Qty: 30 | Fill #0

## 2015-10-26 ENCOUNTER — Encounter: Payer: Self-pay | Admitting: Gastroenterology

## 2015-10-30 MED FILL — HUMULIN R 500 UNITS/ML VIAL: 500 | 30 days supply | Qty: 20 | Fill #7

## 2015-10-30 MED FILL — EFFIENT 10 MG TABLET: 10 | 30 days supply | Qty: 30 | Fill #1

## 2015-10-30 MED FILL — VASCEPA 1 GM CAPSULE: 1 | 30 days supply | Qty: 120 | Fill #4

## 2015-10-30 MED FILL — METOPROLOL SUCC ER 25 MG TA: 25 | 30 days supply | Qty: 30 | Fill #3

## 2015-10-30 MED FILL — RAMIPRIL 10 MG CAPSULE: 10 | 30 days supply | Qty: 30 | Fill #4

## 2015-10-30 MED FILL — JARDIANCE 25 MG TABLET: 25 | 30 days supply | Qty: 30 | Fill #3

## 2015-10-30 MED FILL — ALLOPURINOL 300 MG TABLET: 300 | 30 days supply | Qty: 30 | Fill #4

## 2015-11-23 ENCOUNTER — Ambulatory Visit (HOSPITAL_BASED_OUTPATIENT_CLINIC_OR_DEPARTMENT_OTHER): Payer: 59 | Attending: Physician Assistant | Admitting: *Deleted

## 2015-11-23 VITALS — Ht 71.0 in | Wt 260.0 lb

## 2015-11-23 DIAGNOSIS — R0602 Shortness of breath: Secondary | ICD-10-CM

## 2015-11-23 DIAGNOSIS — R0683 Snoring: Secondary | ICD-10-CM

## 2015-11-23 DIAGNOSIS — G4733 Obstructive sleep apnea (adult) (pediatric): Secondary | ICD-10-CM

## 2015-11-23 DIAGNOSIS — I493 Ventricular premature depolarization: Secondary | ICD-10-CM | POA: Insufficient documentation

## 2015-11-23 DIAGNOSIS — Z79899 Other long term (current) drug therapy: Secondary | ICD-10-CM | POA: Insufficient documentation

## 2015-11-23 DIAGNOSIS — Z7982 Long term (current) use of aspirin: Secondary | ICD-10-CM | POA: Insufficient documentation

## 2015-11-23 MED FILL — ATORVASTATIN 80 MG TABLET: 80 | 30 days supply | Qty: 30 | Fill #1

## 2015-11-23 MED FILL — ESCITALOPRAM 20 MG TABLET: 20 | 30 days supply | Qty: 30 | Fill #2

## 2015-11-23 MED FILL — METOPROLOL SUCC ER 25 MG TA: 25 | 30 days supply | Qty: 30 | Fill #4

## 2015-11-23 MED FILL — FENOFIBRATE 160 MG TABLET: 160 | 30 days supply | Qty: 30 | Fill #2

## 2015-11-23 MED FILL — EFFIENT 10 MG TABLET: 10 | 30 days supply | Qty: 30 | Fill #2

## 2015-11-23 MED FILL — ALLOPURINOL 300 MG TABLET: 300 | 30 days supply | Qty: 30 | Fill #5

## 2015-11-23 MED FILL — RAMIPRIL 10 MG CAPSULE: 10 | 30 days supply | Qty: 30 | Fill #5

## 2015-11-23 MED FILL — VASCEPA 1 GM CAPSULE: 1 | 30 days supply | Qty: 120 | Fill #5

## 2015-11-23 MED FILL — JARDIANCE 25 MG TABLET: 25 | 30 days supply | Qty: 30 | Fill #4

## 2015-11-23 MED FILL — HUMULIN R 500 UNITS/ML VIAL: 500 | 30 days supply | Qty: 20 | Fill #8

## 2015-11-26 ENCOUNTER — Telehealth: Payer: Self-pay | Admitting: Cardiology

## 2015-11-26 ENCOUNTER — Encounter (HOSPITAL_BASED_OUTPATIENT_CLINIC_OR_DEPARTMENT_OTHER): Payer: Self-pay | Admitting: *Deleted

## 2015-11-26 DIAGNOSIS — G4733 Obstructive sleep apnea (adult) (pediatric): Secondary | ICD-10-CM

## 2015-11-26 HISTORY — DX: Obstructive sleep apnea (adult) (pediatric): G47.33

## 2015-11-26 NOTE — Telephone Encounter (Signed)
Please let patient know that they have significant sleep apnea and had successful CPAP titration and will be set up with CPAP unit.  Please let DME know that order is in EPIC.  Please set patient up for OV in 10 weeks 

## 2015-11-26 NOTE — Sleep Study (Signed)
Patient Name: Alec [name], Alec [name] MRN: [id number] Study Date: 11/23/2015 Gender: Male D.O.B: [birth year] Age (years): 31 Referring Provider: Eileen Stanford Interpreting Physician: Fransico Him MD, ABSM RPSGT: [place], Holly  Weight (lbs): 260 BMI: 49 Height (inches): 61 Neck Size: 18.0  CLINICAL INFORMATION Sleep Study Type: Split Night CPAP Indication for sleep study: Snoring   SLEEP STUDY TECHNIQUE As per the AASM Manual for the Scoring of Sleep and Associated Events v2.3 (April 2016) with a hypopnea requiring 4% desaturations. The channels recorded and monitored were frontal, central and occipital EEG, electrooculogram (EOG), submentalis EMG (chin), nasal and oral airflow, thoracic and abdominal wall motion, anterior tibialis EMG, snore microphone, electrocardiogram, and pulse oximetry. Continuous positive airway pressure (CPAP) was initiated when the patient met split night criteria and was titrated according to treat sleep-disordered breathing.  MEDICATIONS Medications taken by the patient :Allopurinol, ASA, Atorvastatin, Zyrtec, Effient, Jardiance, Lexapro, Fenofibrate, Norco, Toprol, Singulair, Altaci, Vascepa Medications administered by patient during sleep study : No sleep medicine administered.  RESPIRATORY PARAMETERS Diagnostic Total AHI (/hr): 104.8  RDI (/hr):104.8  OA Index (/hr): 81.9   CA Index (/hr): 4.3 REM AHI (/hr): N/A  NREM AHI (/hr):104.8  Supine AHI (/hr):106.4  Non-supine AHI (/hr):92.86 Min O2 Sat (%):79.00  Mean O2 (%):91.55  Time below 88% (min):26.4    Titration Optimal Pressure (cm):13  AHI at Optimal Pressure (/hr):0.0  Min O2 at Optimal Pressure (%):87.0 Supine % at Optimal (%):30  Sleep % at Optimal (%):96    SLEEP ARCHITECTURE The recording time for the entire night was 371.9 minutes. During a baseline period of 132.5 minutes, the patient slept for 126.0 minutes in REM and nonREM, yielding a sleep efficiency of 95.1%. Sleep onset  after lights out was 2.9 minutes with a REM latency of N/A minutes. The patient spent 12.30% of the night in stage N1 sleep, 87.70% in stage N2 sleep, 0.00% in stage N3 and 0.00% in REM.   During the titration period of 229.8 minutes, the patient slept for 214.5 minutes in REM and nonREM, yielding a sleep efficiency of 93.3%. Sleep onset after CPAP initiation was 6.3 minutes with a REM latency of 99.0 minutes. The patient spent 5.36% of the night in stage N1 sleep, 66.43% in stage N2 sleep, 0.00% in stage N3 and 28.21% in REM.  CARDIAC DATA The 2 lead EKG demonstrated sinus rhythm. The mean heart rate was 65.36 beats per minute. Other EKG findings include: PVCs.  LEG MOVEMENT DATA The total Periodic Limb Movements of Sleep (PLMS) were 0. The PLMS index was 0.00 .  IMPRESSIONS - Severe obstructive sleep apnea occurred during the diagnostic portion of the study (AHI = 104.8/hour). An optimal PAP pressure was selected for this patient ( 13 cm of water) - No significant central sleep apnea occurred during the diagnostic portion of the study (CAI = 4.3/hour). - Moderate oxygen desaturation was noted during the diagnostic portion of the study (Min O2 =79.00%). - The patient snored with Loud snoring volume during the diagnostic portion of the study. - PVCs were noted during this study. - Clinically significant periodic limb movements did not occur during sleep.  DIAGNOSIS - Obstructive Sleep Apnea (327.23 [G47.33 ICD-10])  RECOMMENDATIONS - Trial of CPAP therapy on 13 cm H2O with a Medium size Fisher&Paykel Full Face Mask Simplus mask and heated humidification. - Avoid alcohol, sedatives and other CNS depressants that may worsen sleep apnea and disrupt normal sleep architecture. - Sleep hygiene should be reviewed to assess factors that may  improve sleep quality. - Weight management and regular exercise should be initiated or continued. - Return to Sleep Center for re-evaluation after 10 weeks of  therapy   [place], American Board of Sleep Medicine  ELECTRONICALLY SIGNED ON:  11/26/2015, 8:08 PM [place] PH: [phone]   FX: [phone] [place]

## 2015-11-28 NOTE — Telephone Encounter (Signed)
Patient informed of information. Stated verbal understanding.  Patient ready to proceed with being set up with CPAP.    Message sent to [facility] -   Once he has started on Therapy, I will schedule 10 week follow-up

## 2015-12-18 ENCOUNTER — Ambulatory Visit (INDEPENDENT_AMBULATORY_CARE_PROVIDER_SITE_OTHER): Payer: 59 | Admitting: Gastroenterology

## 2015-12-18 ENCOUNTER — Encounter: Payer: Self-pay | Admitting: Gastroenterology

## 2015-12-18 VITALS — BP 130/68 | HR 84 | Ht 69.5 in | Wt 266.1 lb

## 2015-12-18 DIAGNOSIS — Z1211 Encounter for screening for malignant neoplasm of colon: Secondary | ICD-10-CM

## 2015-12-18 MED ORDER — NA SULFATE-K SULFATE-MG SULF 17.5-3.13-1.6 GM/177ML PO SOLN: 1.0000 | Freq: Once | ORAL | Status: DC

## 2015-12-18 NOTE — Patient Instructions (Addendum)
You will be set up for a colonoscopy for routine screening. We will communicate with your cardiologist Dr. Tamala Julian about holding your effient for 7 days prior to the colonoscopy.  You have been scheduled for a colonoscopy. Please follow written instructions given to you at your visit today.  Please pick up your prep supplies at the pharmacy within the next 1-3 days. If you use inhalers (even only as needed), please bring them with you on the day of your procedure. Your physician has requested that you go to www.startemmi.com and enter the access code given to you at your visit today. This web site gives a general overview about your procedure. However, you should still follow specific instructions given to you by our office regarding your preparation for the procedure.

## 2015-12-18 NOTE — Progress Notes (Signed)
HPI: This is a    very pleasant 55 year old man   who was referred to me by Reynold Bowen, MD  to evaluate  colon cancer screening .    Chief complaint is routine risk for colon cancer  No colon cancer screening.  It does not run in his family.  He is on effient.  Started Aug 2015 2 stents place for CAD.    He has not stopped the effient, even for an orthopedic knee surgery in the past year or so  Review of systems: Pertinent positive and negative review of systems were noted in the above HPI section. Complete review of systems was performed and was otherwise normal.   Past Medical History  Diagnosis Date  . Mixed hyperlipidemia   . Metabolic syndrome   . History of pancreatitis   . Arthritis   . Chronic lower back pain   . Gout   . Depression   . History of echocardiogram     a. 04/27/14 with EF 65-70% and mild TR.  . Type 2 diabetes mellitus (HCC)     on Insulin pump  . CAD (coronary artery disease)     a. 04/27/14 DES to dRCA; widely patent L coronary system b. cath 08/13/2015 no changes, patent RCA  . OSA (obstructive sleep apnea) 11/26/2015    Severe with total AHI 104.8/hr and oxygen desaturations as low as 79%.  On CPAP at 13cm H2O    Past Surgical History  Procedure Laterality Date  . Carpal tunnel release Bilateral 2006-2008    left 12/2004; right 10/2006  . Shoulder arthroscopy Right ~ 2010  . Knee arthroscopy Bilateral 2000's    right 08/2003;   . Posterior lumbar fusion  07/2009  . Lumbar laminectomy Left 05/2006  . Functional endoscopic sinus surgery  10/2001; 08/2005;   . Left heart catheterization with coronary angiogram N/A 04/27/2014    Procedure: LEFT HEART CATHETERIZATION WITH CORONARY ANGIOGRAM;  Surgeon: [name], MD;  Location: [facility] CATH LAB;  Service: Cardiovascular;  Laterality: N/A;  . Percutaneous stent intervention  04/27/2014    Procedure: PERCUTANEOUS STENT INTERVENTION;  Surgeon: [name], MD;  Location: [facility] CATH LAB;  Service:  Cardiovascular;;  DES x2 (distal +prox RCA)  . Knee arthroscopy with medial menisectomy Left 04/20/2015    Procedure: LEFT ARTHROSCOPY KNEE WITH MEDIAL MENISECTOMY;  Surgeon: Melrose Nakayama, MD;  Location: [facility];  Service: Orthopedics;  Laterality: Left;  Partial medial menisectomy, chondroplasty.  . Chondroplasty  04/20/2015    Procedure: CHONDROPLASTY;  Surgeon: Melrose Nakayama, MD;  Location: [facility];  Service: Orthopedics;;  . Cardiac catheterization N/A 08/14/2015    Procedure: Left Heart Cath and Coronary Angiography;  Surgeon: Belva Crome, MD;  Location: [facility] CV LAB;  Service: Cardiovascular;  Laterality: N/A;    Current Outpatient Prescriptions  Medication Sig Dispense Refill  . allopurinol (ZYLOPRIM) 300 MG tablet Take 1 tablet (300 mg total) by mouth daily. 30 tablet 11  . aspirin EC 81 MG tablet Take 81 mg by mouth daily.    Marland Kitchen atorvastatin (LIPITOR) 80 MG tablet Take 80 mg by mouth at bedtime.  8  . cetirizine (ZYRTEC) 10 MG tablet Take 10 mg by mouth daily.    Marland Kitchen EFFIENT 10 MG TABS tablet TAKE 1 TABLET (10 MG) BY MOUTH DAILY. 30 tablet 11  . Empagliflozin (JARDIANCE) 25 MG TABS Take 25 mg by mouth daily.    Marland Kitchen escitalopram (LEXAPRO) 20 MG  tablet TAKE 1 TABLET (20 MG) BY MOUTH DAILY. 30 tablet 0  . fenofibrate 160 MG tablet Take 1 tablet (160 mg total) by mouth daily. 90 tablet 1  . HYDROcodone-acetaminophen (NORCO) 10-325 MG tablet Take 1 tablet by mouth every 6 (six) hours as needed for moderate pain or severe pain.   0  . insulin regular human CONCENTRATED (HUMULIN R) 500 UNIT/ML SOLN injection Inject 500 Units into the skin 4 (four) times daily. 1.25 units per hour, bolus as needed blood suger    . metoprolol succinate (TOPROL-XL) 25 MG 24 hr tablet Take 25 mg by mouth daily.  7  . montelukast (SINGULAIR) 10 MG tablet Take 10 mg by mouth at bedtime.  5  . NITROSTAT 0.4 MG SL tablet PLACE 1 TABLET (0.4 MG TOTAL) UNDER THE TONGUE EVERY 5  (FIVE) MINUTES X 3 DOSES AS NEEDED FOR CHEST PAIN. DIAL 911 AFTER 3 DOSES 25 tablet 1  . ONE TOUCH ULTRA TEST test strip 1 each by Other route as needed for other (blood sugar).   6  . ramipril (ALTACE) 10 MG capsule Take 10 mg by mouth daily.   6  . VASCEPA 1 G CAPS Take 2 g by mouth 2 (two) times daily.      No current facility-administered medications for this visit.    Allergies as of 12/18/2015 - Review Complete 12/18/2015  Allergen Reaction Noted  . Percocet [oxycodone-acetaminophen] Nausea Only 10/21/2011    Family History  Problem Relation Age of Onset  . Coronary artery disease Mother     MI in her 31's  . Heart attack Mother   . Prostate cancer Father   . Lung cancer Father   . Diabetes Father   . Diabetes Mother     Social History   Social History  . Marital Status: Married    Spouse Name: N/A  . Number of Children: 2  . Years of Education: N/A   Occupational History  . Actuary    Social History Main Topics  . Smoking status: Never Smoker   . Smokeless tobacco: Never Used  . Alcohol Use: Yes     Comment: occ  . Drug Use: No  . Sexual Activity: Not on file   Other Topics Concern  . Not on file   Social History Narrative     Physical Exam: BP 130/68 mmHg  Pulse 84  Ht 5' 9.5" (1.765 m)  Wt 266 lb 2 oz (120.714 kg)  BMI 38.75 kg/m2 Constitutional: generally well-appearing Psychiatric: alert and oriented x3 Eyes: extraocular movements intact Mouth: oral pharynx moist, no lesions Neck: supple no lymphadenopathy Cardiovascular: heart regular rate and rhythm Lungs: clear to auscultation bilaterally Abdomen: soft, nontender, nondistended, no obvious ascites, no peritoneal signs, normal bowel sounds Extremities: no lower extremity edema bilaterally Skin: no lesions on visible extremities   Assessment and plan: 55 y.o. male with  Routine risk for colon cancer  He has been on Effient for the past year and half or so for drug-eluting  coronary artery stents placed August 2015. This is considered one of the stronger blood thinners. He understands that having a colonoscopy on blood thinners puts him at increased risk for bleeding complications. We will communicate with his cardiologist about the safety of holding his blood thinner for 5-7 days prior to a colonoscopy.   Owens Loffler, MD [facility] 12/18/2015, 3:10 PM  Cc: Reynold Bowen, MD

## 2015-12-28 ENCOUNTER — Telehealth: Payer: Self-pay | Admitting: *Deleted

## 2015-12-28 NOTE — Telephone Encounter (Signed)
Left message for patient to call and schedule appointment. Patient needs to be seen for  Follow up with CPAP.   Appointment needs to be around the beginning of June

## 2016-01-11 MED FILL — ESCITALOPRAM 20 MG TABLET: 20 | 30 days supply | Qty: 30 | Fill #3

## 2016-01-12 MED FILL — EFFIENT 10 MG TABLET: 10 | 30 days supply | Qty: 30 | Fill #3

## 2016-01-17 MED FILL — FENOFIBRATE 160 MG TABLET: 160 | 30 days supply | Qty: 30 | Fill #3

## 2016-01-17 MED FILL — METOPROLOL SUCC ER 25 MG TA: 25 | 30 days supply | Qty: 30 | Fill #5

## 2016-01-17 MED FILL — JARDIANCE 25 MG TABLET: 25 | 30 days supply | Qty: 30 | Fill #5

## 2016-01-17 MED FILL — ATORVASTATIN 80 MG TABLET: 80 | 30 days supply | Qty: 30 | Fill #2

## 2016-01-17 MED FILL — HUMULIN R 500 UNITS/ML VIAL: 500 | 30 days supply | Qty: 20 | Fill #0

## 2016-01-17 MED FILL — VASCEPA 1 GM CAPSULE: 1 | 30 days supply | Qty: 120 | Fill #6

## 2016-01-17 MED FILL — MONTELUKAST SOD 10 MG TAB: 10 | 30 days supply | Qty: 30 | Fill #0

## 2016-01-17 MED FILL — ALLOPURINOL 300 MG TABLET: 300 | 30 days supply | Qty: 30 | Fill #6

## 2016-01-17 MED FILL — RAMIPRIL 10 MG CAPSULE: 10 | 30 days supply | Qty: 30 | Fill #6

## 2016-01-17 MED FILL — SUPREP BOWEL PREP KIT: 17.5-3.13-1 | 1 days supply | Qty: 354 | Fill #0

## 2016-01-22 ENCOUNTER — Ambulatory Visit (AMBULATORY_SURGERY_CENTER): Payer: 59 | Admitting: Gastroenterology

## 2016-01-22 ENCOUNTER — Encounter: Payer: Self-pay | Admitting: Gastroenterology

## 2016-01-22 VITALS — BP 102/54 | HR 65 | Temp 98.6°F | Resp 16 | Ht 69.5 in | Wt 266.0 lb

## 2016-01-22 DIAGNOSIS — Z1211 Encounter for screening for malignant neoplasm of colon: Secondary | ICD-10-CM

## 2016-01-22 DIAGNOSIS — D122 Benign neoplasm of ascending colon: Secondary | ICD-10-CM | POA: Diagnosis not present

## 2016-01-22 LAB — GLUCOSE, CAPILLARY
Glucose-Capillary: 111 mg/dL — ABNORMAL HIGH (ref 65–99)
Glucose-Capillary: 97 mg/dL (ref 65–99)

## 2016-01-22 MED ORDER — SODIUM CHLORIDE 0.9 % IV SOLN: 500.0000 mL | INTRAVENOUS | Status: DC

## 2016-01-22 NOTE — Op Note (Signed)
[facility] Patient Name: Alec [name] Procedure Date: 01/22/2016 1:58 PM MRN: [id number] Endoscopist: Milus Banister , MD Age: 55 Date of Birth: 04-21-1961 Gender: Male Procedure:                Colonoscopy Indications:              Screening for colorectal malignant neoplasm Medicines:                Monitored Anesthesia Care Procedure:                Pre-Anesthesia Assessment:                           - Prior to the procedure, a History and Physical                            was performed, and patient medications and                            allergies were reviewed. The patient's tolerance of                            previous anesthesia was also reviewed. The risks                            and benefits of the procedure and the sedation                            options and risks were discussed with the patient.                            All questions were answered, and informed consent                            was obtained. Anticoagulants: The patient has taken                            anticoagulant medication. It was decided not to                            withhold this medication prior to the procedure.                            ASA Grade Assessment: II - A patient with mild                            systemic disease. After reviewing the risks and                            benefits, the patient was deemed in satisfactory                            condition to undergo the procedure.  After obtaining informed consent, the colonoscope                            was passed under direct vision. Throughout the                            procedure, the patient's blood pressure, pulse, and                            oxygen saturations were monitored continuously. The                            Model PCF-[id number]) scope was introduced                            through the anus and advanced to the the cecum,                             identified by appendiceal orifice and ileocecal                            valve. The colonoscopy was performed without                            difficulty. The patient tolerated the procedure                            well. The quality of the bowel preparation was                            good. The ileocecal valve, appendiceal orifice, and                            rectum were photographed. Scope In: 2:12:39 PM Scope Out: 2:24:28 PM Scope Withdrawal Time: 0 hours 10 minutes 37 seconds  Total Procedure Duration: 0 hours 11 minutes 49 seconds  Findings:                 A 3 mm polyp was found in the ascending colon. The                            polyp was sessile. The polyp was removed with a                            cold snare. Resection was complete, but the polyp                            tissue was not retrieved.                           The exam was otherwise without abnormality on                            direct and retroflexion views. Complications:  No immediate complications. Estimated blood loss:                            None. Estimated Blood Loss:     Estimated blood loss: none. Impression:               - One 3 mm polyp in the ascending colon, removed                            with a cold snare. Complete resection. Polyp tissue                            not retrieved.                           - The examination was otherwise normal on direct                            and retroflexion views. Recommendation:           - Patient has a contact number available for                            emergencies. The signs and symptoms of potential                            delayed complications were discussed with the                            patient. Return to normal activities tomorrow.                            Written discharge instructions were provided to the                            patient.                           - Resume previous  diet.                           - Continue present medications.                           - Repeat colonoscopy in 10 years for screening                            purposes. Milus Banister, MD 01/22/2016 2:24:43 PM This report has been signed electronically.

## 2016-01-22 NOTE — Patient Instructions (Signed)
Discharge instructions given. Handout on polyps. Resume previous medications. YOU HAD AN ENDOSCOPIC PROCEDURE TODAY AT THE [facility]:   Refer to the procedure report that was given to you for any specific questions about what was found during the examination.  If the procedure report does not answer your questions, please call your gastroenterologist to clarify.  If you requested that your care partner not be given the details of your procedure findings, then the procedure report has been included in a sealed envelope for you to review at your convenience later.  YOU SHOULD EXPECT: Some feelings of bloating in the abdomen. Passage of more gas than usual.  Walking can help get rid of the air that was put into your GI tract during the procedure and reduce the bloating. If you had a lower endoscopy (such as a colonoscopy or flexible sigmoidoscopy) you may notice spotting of blood in your stool or on the toilet paper. If you underwent a bowel prep for your procedure, you may not have a normal bowel movement for a few days.  Please Note:  You might notice some irritation and congestion in your nose or some drainage.  This is from the oxygen used during your procedure.  There is no need for concern and it should clear up in a day or so.  SYMPTOMS TO REPORT IMMEDIATELY:   Following lower endoscopy (colonoscopy or flexible sigmoidoscopy):  Excessive amounts of blood in the stool  Significant tenderness or worsening of abdominal pains  Swelling of the abdomen that is new, acute  Fever of 100F or higher   For urgent or emergent issues, a gastroenterologist can be reached at any hour by calling [phone].   DIET: Your first meal following the procedure should be a small meal and then it is ok to progress to your normal diet. Heavy or fried foods are harder to digest and may make you feel nauseous or bloated.  Likewise, meals heavy in dairy and vegetables can increase bloating.  Drink  plenty of fluids but you should avoid alcoholic beverages for 24 hours.  ACTIVITY:  You should plan to take it easy for the rest of today and you should NOT DRIVE or use heavy machinery until tomorrow (because of the sedation medicines used during the test).    FOLLOW UP: Our staff will call the number listed on your records the next business day following your procedure to check on you and address any questions or concerns that you may have regarding the information given to you following your procedure. If we do not reach you, we will leave a message.  However, if you are feeling well and you are not experiencing any problems, there is no need to return our call.  We will assume that you have returned to your regular daily activities without incident.  If any biopsies were taken you will be contacted by phone or by letter within the next 1-3 weeks.  Please call us at [phone] if you have not heard about the biopsies in 3 weeks.    SIGNATURES/CONFIDENTIALITY: You and/or your care partner have signed paperwork which will be entered into your electronic medical record.  These signatures attest to the fact that that the information above on your After Visit Summary has been reviewed and is understood.  Full responsibility of the confidentiality of this discharge information lies with you and/or your care-partner. 

## 2016-01-22 NOTE — Progress Notes (Signed)
Report to PACU, RN, vss, BBS= Clear.  

## 2016-01-22 NOTE — Progress Notes (Signed)
Called to room to assist during endoscopic procedure.  Patient ID and intended procedure confirmed with present staff. Received instructions for my participation in the procedure from the performing physician.  

## 2016-01-22 NOTE — Progress Notes (Signed)
Notified Dr.Jacobs that patient is still on Effient and that he took that today. No new orders given at this time.

## 2016-01-23 ENCOUNTER — Telehealth: Payer: Self-pay

## 2016-01-23 NOTE — Telephone Encounter (Signed)
  Follow up Call-  Call back number 01/22/2016  Post procedure Call Back phone  # [phone] cell  Permission to leave phone message Yes     Patient questions:  Do you have a fever, pain , or abdominal swelling? No. Pain Score  0 *  Have you tolerated food without any problems? Yes.    Have you been able to return to your normal activities? Yes.    Do you have any questions about your discharge instructions: Diet   No. Medications  No. Follow up visit  No.  Do you have questions or concerns about your Care? No.  Actions: * If pain score is 4 or above: No action needed, pain <4.

## 2016-02-09 MED FILL — HYDROCODON-APAP 10-325: 10-325 | 30 days supply | Qty: 120 | Fill #0

## 2016-02-16 ENCOUNTER — Ambulatory Visit (INDEPENDENT_AMBULATORY_CARE_PROVIDER_SITE_OTHER): Payer: 59 | Admitting: Cardiology

## 2016-02-16 ENCOUNTER — Encounter: Payer: Self-pay | Admitting: Cardiology

## 2016-02-16 VITALS — BP 124/78 | HR 69 | Ht 69.5 in | Wt 256.0 lb

## 2016-02-16 DIAGNOSIS — E669 Obesity, unspecified: Secondary | ICD-10-CM | POA: Diagnosis not present

## 2016-02-16 DIAGNOSIS — G4733 Obstructive sleep apnea (adult) (pediatric): Secondary | ICD-10-CM

## 2016-02-16 DIAGNOSIS — I10 Essential (primary) hypertension: Secondary | ICD-10-CM

## 2016-02-16 HISTORY — DX: Obesity, unspecified: E66.9

## 2016-02-16 NOTE — Patient Instructions (Signed)

## 2016-02-16 NOTE — Progress Notes (Signed)
Cardiology Office Note    Date:  02/16/2016   ID:  Alec [name], DOB 03-Jun-1961, MRN [id number]  PCP:  Sheela Stack, MD  Cardiologist:  Fransico Him, MD   Chief Complaint  Patient presents with  . Sleep Apnea  . Hypertension    History of Present Illness:  Alec [name] is a 55 y.o. male who is referred today for evaluation of OSA.  He was recently referred for a split night PSG which showed severe OSA with an AHI of 104/hr and oxygen desaturations as low as 79%.  The patient had severe snoring.  He underwent CPAP titration to 13cm H2O.  He is doing well with his CPAP therapy.  He tolerates the mask and feels the pressure is adequate. Since going on CPAP he feels more rested with less daytime sleepiness.  He has no mouth dryness or nasal congestion.      Past Medical History  Diagnosis Date  . Mixed hyperlipidemia   . Metabolic syndrome   . History of pancreatitis   . Arthritis   . Chronic lower back pain   . Gout   . Depression   . History of echocardiogram     a. 04/27/14 with EF 65-70% and mild TR.  . Type 2 diabetes mellitus (HCC)     on Insulin pump  . CAD (coronary artery disease)     a. 04/27/14 DES to dRCA; widely patent L coronary system b. cath 08/13/2015 no changes, patent RCA  . OSA (obstructive sleep apnea) 11/26/2015    Severe with total AHI 104.8/hr and oxygen desaturations as low as 79%.  On CPAP at 13cm H2O  . Obesity (BMI 30-39.9) 02/16/2016    Past Surgical History  Procedure Laterality Date  . Carpal tunnel release Bilateral 2006-2008    left 12/2004; right 10/2006  . Shoulder arthroscopy Right ~ 2010  . Knee arthroscopy Bilateral 2000's    right 08/2003;   . Posterior lumbar fusion  07/2009  . Lumbar laminectomy Left 05/2006  . Functional endoscopic sinus surgery  10/2001; 08/2005;   . Left heart catheterization with coronary angiogram N/A 04/27/2014    Procedure: LEFT HEART CATHETERIZATION WITH CORONARY ANGIOGRAM;  Surgeon: [name],  MD;  Location: [facility] CATH LAB;  Service: Cardiovascular;  Laterality: N/A;  . Percutaneous stent intervention  04/27/2014    Procedure: PERCUTANEOUS STENT INTERVENTION;  Surgeon: [name], MD;  Location: [facility] CATH LAB;  Service: Cardiovascular;;  DES x2 (distal +prox RCA)  . Knee arthroscopy with medial menisectomy Left 04/20/2015    Procedure: LEFT ARTHROSCOPY KNEE WITH MEDIAL MENISECTOMY;  Surgeon: Melrose Nakayama, MD;  Location: [facility];  Service: Orthopedics;  Laterality: Left;  Partial medial menisectomy, chondroplasty.  . Chondroplasty  04/20/2015    Procedure: CHONDROPLASTY;  Surgeon: Melrose Nakayama, MD;  Location: [facility];  Service: Orthopedics;;  . Cardiac catheterization N/A 08/14/2015    Procedure: Left Heart Cath and Coronary Angiography;  Surgeon: Belva Crome, MD;  Location: [facility];  Service: Cardiovascular;  Laterality: N/A;    Current Medications: Outpatient Prescriptions Prior to Visit  Medication Sig Dispense Refill  . allopurinol (ZYLOPRIM) 300 MG tablet Take 1 tablet (300 mg total) by mouth daily. 30 tablet 11  . aspirin EC 81 MG tablet Take 81 mg by mouth daily.    Marland Kitchen atorvastatin (LIPITOR) 80 MG tablet Take 80 mg by mouth at bedtime.  8  . cetirizine (ZYRTEC) 10  MG tablet Take 10 mg by mouth daily.    Marland Kitchen EFFIENT 10 MG TABS tablet TAKE 1 TABLET (10 MG) BY MOUTH DAILY. 30 tablet 11  . Empagliflozin (JARDIANCE) 25 MG TABS Take 25 mg by mouth daily.    Marland Kitchen escitalopram (LEXAPRO) 20 MG tablet TAKE 1 TABLET (20 MG) BY MOUTH DAILY. 30 tablet 0  . fenofibrate 160 MG tablet Take 1 tablet (160 mg total) by mouth daily. 90 tablet 1  . HYDROcodone-acetaminophen (NORCO) 10-325 MG tablet Take 1 tablet by mouth every 6 (six) hours as needed for moderate pain or severe pain.   0  . insulin regular human CONCENTRATED (HUMULIN R) 500 UNIT/ML SOLN injection Inject 500 Units into the skin 4 (four) times daily. 1.25 units per hour, bolus as needed  blood suger    . metoprolol succinate (TOPROL-XL) 25 MG 24 hr tablet Take 25 mg by mouth daily.  7  . montelukast (SINGULAIR) 10 MG tablet Take 10 mg by mouth at bedtime.  5  . NITROSTAT 0.4 MG SL tablet PLACE 1 TABLET (0.4 MG TOTAL) UNDER THE TONGUE EVERY 5 (FIVE) MINUTES X 3 DOSES AS NEEDED FOR CHEST PAIN. DIAL 911 AFTER 3 DOSES 25 tablet 1  . ONE TOUCH ULTRA TEST test strip 1 each by Other route as needed for other (blood sugar).   6  . ramipril (ALTACE) 10 MG capsule Take 10 mg by mouth daily.   6  . VASCEPA 1 G CAPS Take 2 g by mouth 2 (two) times daily.      No facility-administered medications prior to visit.     Allergies:   Percocet   Social History   Social History  . Marital Status: Married    Spouse Name: N/A  . Number of Children: 2  . Years of Education: N/A   Occupational History  . Actuary    Social History Main Topics  . Smoking status: Never Smoker   . Smokeless tobacco: Never Used  . Alcohol Use: Yes     Comment: occ  . Drug Use: No  . Sexual Activity: Not Asked   Other Topics Concern  . None   Social History Narrative     Family History:  The patient's family history includes Coronary artery disease in his mother; Diabetes in his father and mother; Heart attack in his mother; Lung cancer in his father; Prostate cancer in his father. There is no history of Colon cancer.   ROS:   Please see the history of present illness.    ROS All other systems reviewed and are negative.   PHYSICAL EXAM:   VS:  BP 124/78 mmHg  Pulse 69  Ht 5' 9.5" (1.765 m)  Wt 256 lb (116.121 kg)  BMI 37.28 kg/m2   GEN: Well nourished, well developed, in no acute distress HEENT: normal Neck: no JVD, carotid bruits, or masses Cardiac: RRR; no murmurs, rubs, or gallops,no edema.  Intact distal pulses bilaterally.  Respiratory:  clear to auscultation bilaterally, normal work of breathing GI: soft, nontender, nondistended, + BS MS: no deformity or atrophy Skin: warm  and dry, no rash Neuro:  Alert and Oriented x 3, Strength and sensation are intact Psych: euthymic mood, full affect  Wt Readings from Last 3 Encounters:  02/16/16 256 lb (116.121 kg)  01/22/16 266 lb (120.657 kg)  12/18/15 266 lb 2 oz (120.714 kg)      Studies/Labs Reviewed:   EKG:  EKG is not ordered today.  Recent Labs: 08/11/2015: ALT 17 08/13/2015: BUN 10; Potassium 3.9; Sodium 135 08/14/2015: Creatinine, Ser 0.54*; Hemoglobin 13.8; Platelets 160   Lipid Panel    Component Value Date/Time   CHOL 349* 10/13/2015 0858   TRIG 2598* 10/13/2015 0858   HDL 27* 10/13/2015 0858   CHOLHDL 12.9* 10/13/2015 0858   VLDL NOT CALC 10/13/2015 0858   LDLCALC NOT CALC 10/13/2015 0858   LDLDIRECT 65 10/13/2015 0858    Additional studies/ records that were reviewed today include:  Sleep study    ASSESSMENT:    1. OSA (obstructive sleep apnea)   2. Obesity (BMI 30-39.9)   3. Essential hypertension      PLAN:  In order of problems listed above:  OSA - the patient is tolerating PAP therapy well without any problems. The PAP download was reviewed today and showed an AHI of 0.5/hr on 13 cm H2O with 70% compliance in using more than 4 hours nightly.  The patient has been using and benefiting from CPAP use and will continue to benefit from therapy.  HTN - BP controlled on current medical regimen.  Continue BB/ACE I. Obesity - I have encouraged him to get into a routine exercise program and cut back on carbs and portions.     Medication Adjustments/Labs and Tests Ordered: Current medicines are reviewed at length with the patient today.  Concerns regarding medicines are outlined above.  Medication changes, Labs and Tests ordered today are listed in the Patient Instructions below.  Patient Instructions  Medication Instructions:  Your physician recommends that you continue on your current medications as directed. Please refer to the Current Medication list given to you today.    Labwork: None  Testing/Procedures: None  Follow-Up: Your physician wants you to follow-up in: 1 year with Dr. Radford Pax. You will receive a reminder letter in the mail two months in advance. If you don't receive a letter, please call our office to schedule the follow-up appointment.   Any Other Special Instructions Will Be Listed Below (If Applicable).     If you need a refill on your cardiac medications before your next appointment, please call your pharmacy.       Signed, Fransico Him, MD  02/16/2016 8:49 AM    [address], [place], [place]  [address] Phone: [phone]; Fax: [phone]

## 2016-02-20 MED FILL — METOPROLOL SUCC ER 25 MG TA: 25 | 30 days supply | Qty: 30 | Fill #6

## 2016-02-20 MED FILL — FENOFIBRATE 160 MG TABLET: 160 | 30 days supply | Qty: 30 | Fill #4

## 2016-02-20 MED FILL — ESCITALOPRAM 20 MG TABLET: 20 | 30 days supply | Qty: 30 | Fill #4

## 2016-02-20 MED FILL — ATORVASTATIN 80 MG TABLET: 80 | 30 days supply | Qty: 30 | Fill #3

## 2016-02-20 MED FILL — EFFIENT 10 MG TABLET: 10 | 30 days supply | Qty: 30 | Fill #4

## 2016-02-20 MED FILL — JARDIANCE 25 MG TABLET: 25 | 30 days supply | Qty: 30 | Fill #0

## 2016-02-20 MED FILL — MONTELUKAST SOD 10 MG TAB: 10 | 30 days supply | Qty: 30 | Fill #1

## 2016-02-20 MED FILL — VASCEPA 1 GM CAPSULE: 1 | 30 days supply | Qty: 120 | Fill #0

## 2016-02-20 MED FILL — HUMULIN R 500 UNITS/ML VIAL: 500 | 30 days supply | Qty: 20 | Fill #1

## 2016-02-20 MED FILL — ALLOPURINOL 300 MG TABLET: 300 | 30 days supply | Qty: 30 | Fill #0

## 2016-02-22 ENCOUNTER — Encounter: Payer: Self-pay | Admitting: Cardiology

## 2016-02-26 MED FILL — RAMIPRIL 10 MG CAPSULE: 10 | 30 days supply | Qty: 30 | Fill #0

## 2016-03-04 MED FILL — ONE TOUCH ULTRA TEST STRIPS: 30 days supply | Qty: 300 | Fill #0

## 2016-03-14 MED FILL — HYDROCODON-APAP 10-325: 10-325 | 30 days supply | Qty: 120 | Fill #0

## 2016-04-02 MED FILL — FENOFIBRATE 160 MG TABLET: 160 | 30 days supply | Qty: 30 | Fill #5

## 2016-04-02 MED FILL — ESCITALOPRAM 20 MG TABLET: 20 | 30 days supply | Qty: 30 | Fill #5

## 2016-04-02 MED FILL — EFFIENT 10 MG TABLET: 10 | 30 days supply | Qty: 30 | Fill #5

## 2016-04-02 MED FILL — RAMIPRIL 10 MG CAPSULE: 10 | 30 days supply | Qty: 30 | Fill #1

## 2016-04-02 MED FILL — ALLOPURINOL 300 MG TABLET: 300 | 30 days supply | Qty: 30 | Fill #1

## 2016-04-02 MED FILL — ATORVASTATIN 80 MG TABLET: 80 | 30 days supply | Qty: 30 | Fill #4

## 2016-04-02 MED FILL — MONTELUKAST SOD 10 MG TAB: 10 | 30 days supply | Qty: 30 | Fill #2

## 2016-04-02 MED FILL — METOPROLOL SUCC ER 25 MG TA: 25 | 30 days supply | Qty: 30 | Fill #7

## 2016-04-02 MED FILL — JARDIANCE 25 MG TABLET: 25 | 30 days supply | Qty: 30 | Fill #1

## 2016-04-02 MED FILL — VASCEPA 1 GM CAPSULE: 1 | 30 days supply | Qty: 120 | Fill #1

## 2016-04-02 MED FILL — HUMULIN R 500 UNITS/ML VIAL: 500 | 30 days supply | Qty: 20 | Fill #2

## 2016-04-15 MED FILL — HYDROCODON-APAP 10-325: 10-325 | 30 days supply | Qty: 120 | Fill #0

## 2016-05-13 ENCOUNTER — Other Ambulatory Visit: Payer: Self-pay | Admitting: Physician Assistant

## 2016-05-13 MED FILL — ALLOPURINOL 300 MG TABLET: 300 | 30 days supply | Qty: 30 | Fill #2

## 2016-05-13 MED FILL — MONTELUKAST SOD 10 MG TAB: 10 | 30 days supply | Qty: 30 | Fill #3

## 2016-05-13 MED FILL — ESCITALOPRAM 20 MG TABLET: 20 | 30 days supply | Qty: 30 | Fill #6

## 2016-05-13 MED FILL — EFFIENT 10 MG TABLET: 10 | 30 days supply | Qty: 30 | Fill #6

## 2016-05-13 MED FILL — JARDIANCE 25 MG TABLET: 25 | 30 days supply | Qty: 30 | Fill #2

## 2016-05-13 MED FILL — RAMIPRIL 10 MG CAPSULE: 10 | 30 days supply | Qty: 30 | Fill #2

## 2016-05-13 MED FILL — VASCEPA 1 GM CAPSULE: 1 | 30 days supply | Qty: 120 | Fill #2

## 2016-05-13 MED FILL — ONE TOUCH ULTRA TEST STRIPS: 20 days supply | Qty: 200 | Fill #1

## 2016-05-13 MED FILL — HUMULIN R 500 UNITS/ML VIAL: 500 | 30 days supply | Qty: 20 | Fill #3

## 2016-05-13 MED FILL — ATORVASTATIN 80 MG TABLET: 80 | 30 days supply | Qty: 30 | Fill #5

## 2016-05-13 NOTE — Telephone Encounter (Signed)
Review for refill, Thank you. 

## 2016-05-22 MED FILL — FENOFIBRATE 160 MG TABLET: 160 | 30 days supply | Qty: 30 | Fill #0

## 2016-05-22 MED FILL — METOPROLOL SUCC ER 25 MG TA: 25 | 30 days supply | Qty: 30 | Fill #0

## 2016-06-14 MED FILL — RAMIPRIL 10 MG CAPSULE: 10 | 30 days supply | Qty: 30 | Fill #3

## 2016-06-14 MED FILL — EFFIENT 10 MG TABLET: 10 | 30 days supply | Qty: 30 | Fill #7

## 2016-06-14 MED FILL — MONTELUKAST SOD 10 MG TAB: 10 | 30 days supply | Qty: 30 | Fill #4

## 2016-06-14 MED FILL — METOPROLOL SUCC ER 25 MG TA: 25 | 30 days supply | Qty: 30 | Fill #1

## 2016-06-14 MED FILL — HUMULIN R 500 UNITS/ML VIAL: 500 | 30 days supply | Qty: 20 | Fill #4

## 2016-06-14 MED FILL — ALLOPURINOL 300 MG TABLET: 300 | 30 days supply | Qty: 30 | Fill #3

## 2016-06-14 MED FILL — VASCEPA 1 GM CAPSULE: 1 | 30 days supply | Qty: 120 | Fill #3

## 2016-06-14 MED FILL — JARDIANCE 25 MG TABLET: 25 | 30 days supply | Qty: 30 | Fill #3

## 2016-06-14 MED FILL — FENOFIBRATE 160 MG TABLET: 160 | 30 days supply | Qty: 30 | Fill #1

## 2016-06-19 MED FILL — HYDROCODON-APAP 10-325: 10-325 | 30 days supply | Qty: 120 | Fill #0

## 2016-06-19 MED FILL — ESCITALOPRAM 20 MG TABLET: 20 | 30 days supply | Qty: 30 | Fill #0

## 2016-06-22 ENCOUNTER — Emergency Department (HOSPITAL_BASED_OUTPATIENT_CLINIC_OR_DEPARTMENT_OTHER)
Admission: EM | Admit: 2016-06-22 | Discharge: 2016-06-22 | Disposition: A | Discharge status: Home / Self Care | Payer: Managed Care, Other (non HMO) | Attending: Emergency Medicine | Admitting: Emergency Medicine

## 2016-06-22 ENCOUNTER — Encounter (HOSPITAL_BASED_OUTPATIENT_CLINIC_OR_DEPARTMENT_OTHER): Payer: Self-pay | Admitting: Emergency Medicine

## 2016-06-22 DIAGNOSIS — I2510 Atherosclerotic heart disease of native coronary artery without angina pectoris: Secondary | ICD-10-CM | POA: Insufficient documentation

## 2016-06-22 DIAGNOSIS — I10 Essential (primary) hypertension: Secondary | ICD-10-CM | POA: Insufficient documentation

## 2016-06-22 DIAGNOSIS — Z79899 Other long term (current) drug therapy: Secondary | ICD-10-CM | POA: Insufficient documentation

## 2016-06-22 DIAGNOSIS — Z7982 Long term (current) use of aspirin: Secondary | ICD-10-CM | POA: Insufficient documentation

## 2016-06-22 DIAGNOSIS — R040 Epistaxis: Secondary | ICD-10-CM | POA: Diagnosis not present

## 2016-06-22 DIAGNOSIS — E119 Type 2 diabetes mellitus without complications: Secondary | ICD-10-CM | POA: Insufficient documentation

## 2016-06-22 DIAGNOSIS — Z794 Long term (current) use of insulin: Secondary | ICD-10-CM | POA: Diagnosis not present

## 2016-06-22 LAB — CBC
HCT: 45.1 % (ref 39.0–52.0)
Hemoglobin: 15.4 g/dL (ref 13.0–17.0)
MCH: 29.8 pg (ref 26.0–34.0)
MCHC: 34.1 g/dL (ref 30.0–36.0)
MCV: 87.2 fL (ref 78.0–100.0)
Platelets: 234 10*3/uL (ref 150–400)
RBC: 5.17 MIL/uL (ref 4.22–5.81)
RDW: 14.3 % (ref 11.5–15.5)
WBC: 9.2 10*3/uL (ref 4.0–10.5)

## 2016-06-22 MED ORDER — OXYMETAZOLINE HCL 0.05 % NA SOLN
2.0000 | Freq: Once | NASAL | Status: AC
  Administered 2016-06-22: 2 via NASAL
  Filled 2016-06-22: qty 15

## 2016-06-22 MED ORDER — AMOXICILLIN-POT CLAVULANATE 875-125 MG PO TABS: 1.0000 | ORAL_TABLET | Freq: Two times a day (BID) | ORAL | 0 refills | Status: DC

## 2016-06-22 NOTE — ED Provider Notes (Signed)
[facility] DEPT [facility] Provider Note   CSN: [id number] Arrival date & time: 06/22/16  1259     History   Chief Complaint Chief Complaint  Patient presents with  . Epistaxis    HPI Alec [name] is a 55 y.o. male.  HPI  55 year old male presents with a left nose bleed for 5 hours. Started at 8 AM. Pretty much consistent. Multiple clots, is on effient for for a cardiac stent. No lightheadedness or dizziness. No CP/dyspnea. Developed a mild headache 2 hours after nose bleed. Has chronic sinus congestion but is worse recently. Uses a CPAP at night and his nose is often dry. No nose picking or trauma.  Past Medical History:  Diagnosis Date  . Arthritis   . CAD (coronary artery disease)    a. 04/27/14 DES to dRCA; widely patent L coronary system b. cath 08/13/2015 no changes, patent RCA  . Chronic lower back pain   . Depression   . Gout   . History of echocardiogram    a. 04/27/14 with EF 65-70% and mild TR.  Marland Kitchen History of pancreatitis   . Metabolic syndrome   . Mixed hyperlipidemia   . Obesity (BMI 30-39.9) 02/16/2016  . OSA (obstructive sleep apnea) 11/26/2015   Severe with total AHI 104.8/hr and oxygen desaturations as low as 79%.  On CPAP at 13cm H2O  . Type 2 diabetes mellitus (HCC)    on Insulin pump    Patient Active Problem List   Diagnosis Date Noted  . Obesity (BMI 30-39.9) 02/16/2016  . OSA (obstructive sleep apnea) 11/26/2015  . Controlled type 2 diabetes mellitus ([place])   . Essential hypertension   . Acute chest pain 08/12/2015  . Hypertriglyceridemia 04/28/2014  . CAD (coronary artery disease)   . Metabolic syndrome   . History of echocardiogram   . Depression   . Gout   . Unstable angina ([place]) 04/26/2014  . Type 2 diabetes mellitus ([place]) 04/26/2014  . Mixed hyperlipidemia 04/26/2014    Past Surgical History:  Procedure Laterality Date  . CARDIAC CATHETERIZATION N/A 08/14/2015   Procedure: Left Heart Cath and Coronary Angiography;  Surgeon: Belva Crome, MD;  Location: [facility];  Service: Cardiovascular;  Laterality: N/A;  . CARPAL TUNNEL RELEASE Bilateral 2006-2008   left 12/2004; right 10/2006  . CHONDROPLASTY  04/20/2015   Procedure: CHONDROPLASTY;  Surgeon: Melrose Nakayama, MD;  Location: [facility];  Service: Orthopedics;;  . FUNCTIONAL ENDOSCOPIC SINUS SURGERY  10/2001; 08/2005;   . KNEE ARTHROSCOPY Bilateral 2000's   right 08/2003;   . KNEE ARTHROSCOPY WITH MEDIAL MENISECTOMY Left 04/20/2015   Procedure: LEFT ARTHROSCOPY KNEE WITH MEDIAL MENISECTOMY;  Surgeon: Melrose Nakayama, MD;  Location: [facility];  Service: Orthopedics;  Laterality: Left;  Partial medial menisectomy, chondroplasty.  Marland Kitchen LEFT HEART CATHETERIZATION WITH CORONARY ANGIOGRAM N/A 04/27/2014   Procedure: LEFT HEART CATHETERIZATION WITH CORONARY ANGIOGRAM;  Surgeon: [name], MD;  Location: [facility] CATH LAB;  Service: Cardiovascular;  Laterality: N/A;  . LUMBAR LAMINECTOMY Left 05/2006  . PERCUTANEOUS STENT INTERVENTION  04/27/2014   Procedure: PERCUTANEOUS STENT INTERVENTION;  Surgeon: [name], MD;  Location: [facility] CATH LAB;  Service: Cardiovascular;;  DES x2 (distal +prox RCA)  . POSTERIOR LUMBAR FUSION  07/2009  . SHOULDER ARTHROSCOPY Right ~ 2010       Home Medications    Prior to Admission medications   Medication Sig Start Date End Date Taking? Authorizing Provider  Alirocumab (PRALUENT [place]) Inject into the skin as directed.    Historical Provider, MD  allopurinol (ZYLOPRIM) 300 MG tablet Take 1 tablet (300 mg total) by mouth daily. 04/28/14 11/01/16  Eileen Stanford, PA-C  amoxicillin-clavulanate (AUGMENTIN) 875-125 MG tablet Take 1 tablet by mouth 2 (two) times daily. One po bid x 7 days 06/22/16   Sherwood Gambler, MD  aspirin EC 81 MG tablet Take 81 mg by mouth daily.    Historical Provider, MD  atorvastatin (LIPITOR) 80 MG tablet Take 80 mg by mouth at bedtime. 07/28/15   Historical Provider, MD  cetirizine  (ZYRTEC) 10 MG tablet Take 10 mg by mouth daily.    Historical Provider, MD  EFFIENT 10 MG TABS tablet TAKE 1 TABLET (10 MG) BY MOUTH DAILY. 09/19/15   Belva Crome, MD  Empagliflozin (JARDIANCE) 25 MG TABS Take 25 mg by mouth daily.    Historical Provider, MD  escitalopram (LEXAPRO) 20 MG tablet TAKE 1 TABLET (20 MG) BY MOUTH DAILY. 06/23/15   Belva Crome, MD  fenofibrate 160 MG tablet Take 1 tablet (160 mg total) by mouth daily. Please call and schedule an appointment with Dr Tamala Julian 05/14/16   Almyra Deforest, PA  HYDROcodone-acetaminophen [facility]) 10-325 MG tablet Take 1 tablet by mouth every 6 (six) hours as needed for moderate pain or severe pain.  05/17/15   Historical Provider, MD  insulin regular human CONCENTRATED (HUMULIN R) 500 UNIT/ML SOLN injection Inject 500 Units into the skin 4 (four) times daily. 1.25 units per hour, bolus as needed blood suger    Historical Provider, MD  metoprolol succinate (TOPROL-XL) 25 MG 24 hr tablet Take 25 mg by mouth daily. 07/28/15   Historical Provider, MD  montelukast (SINGULAIR) 10 MG tablet Take 10 mg by mouth at bedtime. 07/28/15   Historical Provider, MD  NITROSTAT 0.4 MG SL tablet PLACE 1 TABLET (0.4 MG TOTAL) UNDER THE TONGUE EVERY 5 (FIVE) MINUTES X 3 DOSES AS NEEDED FOR CHEST PAIN. DIAL 911 AFTER 3 DOSES 08/16/15   Eileen Stanford, PA-C  ONE TOUCH ULTRA TEST test strip 1 each by Other route as needed for other (blood sugar).  08/16/15   Historical Provider, MD  ramipril (ALTACE) 10 MG capsule Take 10 mg by mouth daily.  07/28/15   Historical Provider, MD  VASCEPA 1 G CAPS Take 2 g by mouth 2 (two) times daily.  03/03/14   Historical Provider, MD    Family History Family History  Problem Relation Age of Onset  . Coronary artery disease Mother     MI in her 10's  . Heart attack Mother   . Diabetes Mother   . Prostate cancer Father   . Lung cancer Father   . Diabetes Father   . Colon cancer Neg Hx     Social History Social History  Substance Use  Topics  . Smoking status: Never Smoker  . Smokeless tobacco: Never Used  . Alcohol use Yes     Comment: occ     Allergies   Percocet [oxycodone-acetaminophen]   Review of Systems Review of Systems  HENT: Positive for nosebleeds.   Respiratory: Negative for shortness of breath.   Cardiovascular: Negative for chest pain.  Neurological: Positive for headaches. Negative for dizziness.  All other systems reviewed and are negative.    Physical Exam Updated Vital Signs BP 115/84 (BP Location: Left Arm)   Pulse 78   Temp 97.5 F (36.4 C) (Oral)   Resp 18  Ht 5\' 11"  (1.803 m)   Wt 245 lb (111.1 kg)   SpO2 94%   BMI 34.17 kg/m   Physical Exam  Constitutional: He is oriented to person, place, and time. He appears well-developed and well-nourished. No distress.  HENT:  Head: Normocephalic and atraumatic.  Right Ear: External ear normal.  Left Ear: External ear normal.  Nose: Nose normal.  Clamp present on external nares When removed, there is dried clots in left nare. Right nare benign When clot removed, there is some friability to floor of nare  Eyes: Right eye exhibits no discharge. Left eye exhibits no discharge.  Neck: Neck supple.  Cardiovascular: Normal rate, regular rhythm and normal heart sounds.   Pulmonary/Chest: Effort normal and breath sounds normal.  Abdominal: Soft. There is no tenderness.  Musculoskeletal: He exhibits no edema.  Neurological: He is alert and oriented to person, place, and time.  Skin: Skin is warm and dry. He is not diaphoretic.  Nursing note and vitals reviewed.    ED Treatments / Results  Labs (all labs ordered are listed, but only abnormal results are displayed) Labs Reviewed  CBC    EKG  EKG Interpretation None       Radiology No results found.  Procedures .Epistaxis Management Date/Time: 06/22/2016 2:07 PM Performed by: Sherwood Gambler Authorized by: Sherwood Gambler   Consent:    Consent obtained:  Verbal    Consent given by:  Patient   Risks discussed:  Nasal injury, infection and bleeding   Alternatives discussed:  No treatment Anesthesia (see MAR for exact dosages):    Anesthesia method:  None Procedure details:    Treatment site:  L anterior   Treatment method:  Anterior pack   Treatment complexity:  Limited   Treatment episode: initial   Post-procedure details:    Assessment:  Bleeding decreased   Patient tolerance of procedure:  Tolerated well, no immediate complications   (including critical care time)  Medications Ordered in ED Medications  oxymetazoline (AFRIN) 0.05 % nasal spray 2 spray (2 sprays Left Nare Given 06/22/16 1345)     Initial Impression / Assessment and Plan / ED Course  I have reviewed the triage vital signs and the nursing notes.  Pertinent labs & imaging results that were available during my care of the patient were reviewed by me and considered in my medical decision making (see chart for details).  Clinical Course  Comment By Time  After blowing his nose to clear clots, the bleeding seems to have slowed. He feels a little bit of drainage in the back of his throat but unclear if this is new bleeding or old clot. I see no obvious bleeding or signs of bleeding over Kiesselbach's plexus. There might be some friability little further back but not in any area where I'm comfortable using silver nitrate. Discussed that if the bleeding recurs, will need management with packing. Will give afrin and observe for rebleeding. Check cbc Sherwood Gambler, MD 10/07 1341  Bleeding recurred but not as bad. Packed with rhino rocket. Will observe. Sherwood Gambler, MD 10/07 1407  No bleeding since rhino rocket. Will place on antibiotics, f/u with his ENT. Sherwood Gambler, MD 10/07 1446    Patient will f/u with his ENT, Dr. Redmond Baseman. Discussed return precautions  Final Clinical Impressions(s) / ED Diagnoses   Final diagnoses:  Left-sided epistaxis    New Prescriptions New  Prescriptions   AMOXICILLIN-CLAVULANATE (AUGMENTIN) 875-125 MG TABLET    Take 1 tablet by mouth 2 (  two) times daily. One po bid x 7 days     Sherwood Gambler, MD 06/22/16 1506

## 2016-06-22 NOTE — ED Triage Notes (Signed)
Pt reports nose bleeding starting this morning at 8am.  Pt is on blood thinner called effient.  Pt has reported large clots.

## 2016-06-24 DIAGNOSIS — Z7901 Long term (current) use of anticoagulants: Secondary | ICD-10-CM | POA: Insufficient documentation

## 2016-06-24 DIAGNOSIS — R040 Epistaxis: Secondary | ICD-10-CM | POA: Insufficient documentation

## 2016-07-18 MED FILL — MONTELUKAST SOD 10 MG TAB: 10 | 30 days supply | Qty: 30 | Fill #0

## 2016-07-18 MED FILL — ESCITALOPRAM 20 MG TABLET: 20 | 30 days supply | Qty: 30 | Fill #1

## 2016-07-18 MED FILL — HUMULIN R 500 UNITS/ML VIAL: 500 | 30 days supply | Qty: 20 | Fill #5

## 2016-07-18 MED FILL — ALLOPURINOL 300 MG TABLET: 300 | 30 days supply | Qty: 30 | Fill #4

## 2016-07-18 MED FILL — EFFIENT 10 MG TABLET: 10 | 30 days supply | Qty: 30 | Fill #8

## 2016-07-18 MED FILL — METOPROLOL SUCC ER 25 MG TA: 25 | 30 days supply | Qty: 30 | Fill #2

## 2016-07-18 MED FILL — VASCEPA 1 GM CAPSULE: 1 | 30 days supply | Qty: 120 | Fill #4

## 2016-07-18 MED FILL — FENOFIBRATE 160 MG TABLET: 160 | 30 days supply | Qty: 30 | Fill #2

## 2016-07-18 MED FILL — ONE TOUCH ULTRA TEST STRIPS: 20 days supply | Qty: 200 | Fill #2

## 2016-07-18 MED FILL — RAMIPRIL 10 MG CAPSULE: 10 | 30 days supply | Qty: 30 | Fill #4

## 2016-07-18 MED FILL — JARDIANCE 25 MG TABLET: 25 | 30 days supply | Qty: 30 | Fill #4

## 2016-08-14 MED FILL — ONE TOUCH ULTRA TEST STRIPS: 30 days supply | Qty: 300 | Fill #1

## 2016-08-14 MED FILL — ALLOPURINOL 300 MG TABLET: 300 | 30 days supply | Qty: 30 | Fill #5

## 2016-08-14 MED FILL — FENOFIBRATE 160 MG TABLET: 160 | 30 days supply | Qty: 30 | Fill #3

## 2016-08-14 MED FILL — EFFIENT 10 MG TABLET: 10 | 30 days supply | Qty: 30 | Fill #9

## 2016-08-14 MED FILL — ESCITALOPRAM 20 MG TABLET: 20 | 30 days supply | Qty: 30 | Fill #2

## 2016-08-14 MED FILL — RAMIPRIL 10 MG CAPSULE: 10 | 30 days supply | Qty: 30 | Fill #5

## 2016-08-14 MED FILL — JARDIANCE 25 MG TABLET: 25 | 30 days supply | Qty: 30 | Fill #5

## 2016-08-14 MED FILL — VASCEPA 1 GM CAPSULE: 1 | 30 days supply | Qty: 120 | Fill #5

## 2016-08-14 MED FILL — METOPROLOL SUCC ER 25 MG TA: 25 | 30 days supply | Qty: 30 | Fill #3

## 2016-08-14 MED FILL — HUMULIN R 500 UNITS/ML VIAL: 500 | 30 days supply | Qty: 20 | Fill #6

## 2016-08-14 MED FILL — MONTELUKAST SOD 10 MG TAB: 10 | 30 days supply | Qty: 30 | Fill #1

## 2016-08-16 MED FILL — HYDROCODON-APAP 10-325: 10-325 | 30 days supply | Qty: 120 | Fill #0

## 2016-09-13 MED FILL — HYDROCODON-APAP 10-325: 10-325 | 30 days supply | Qty: 120 | Fill #0

## 2016-09-15 ENCOUNTER — Encounter (HOSPITAL_BASED_OUTPATIENT_CLINIC_OR_DEPARTMENT_OTHER): Payer: Self-pay | Admitting: Emergency Medicine

## 2016-09-15 ENCOUNTER — Emergency Department (HOSPITAL_BASED_OUTPATIENT_CLINIC_OR_DEPARTMENT_OTHER)
Admission: EM | Admit: 2016-09-15 | Discharge: 2016-09-15 | Disposition: A | Discharge status: Home / Self Care | Payer: PRIVATE HEALTH INSURANCE | Attending: Emergency Medicine | Admitting: Emergency Medicine

## 2016-09-15 DIAGNOSIS — Z794 Long term (current) use of insulin: Secondary | ICD-10-CM | POA: Insufficient documentation

## 2016-09-15 DIAGNOSIS — I10 Essential (primary) hypertension: Secondary | ICD-10-CM | POA: Insufficient documentation

## 2016-09-15 DIAGNOSIS — E119 Type 2 diabetes mellitus without complications: Secondary | ICD-10-CM | POA: Insufficient documentation

## 2016-09-15 DIAGNOSIS — R040 Epistaxis: Secondary | ICD-10-CM | POA: Diagnosis not present

## 2016-09-15 DIAGNOSIS — Z79899 Other long term (current) drug therapy: Secondary | ICD-10-CM | POA: Diagnosis not present

## 2016-09-15 MED ORDER — OXYMETAZOLINE HCL 0.05 % NA SOLN
1.0000 | Freq: Once | NASAL | Status: AC
  Administered 2016-09-15: 1 via NASAL

## 2016-09-15 MED ORDER — OXYMETAZOLINE HCL 0.05 % NA SOLN
NASAL | Status: AC
  Filled 2016-09-15: qty 15

## 2016-09-15 NOTE — ED Notes (Signed)
Family at bedside. 

## 2016-09-15 NOTE — ED Triage Notes (Signed)
Patient has had a nose bleed x 2 hours.

## 2016-09-15 NOTE — ED Provider Notes (Signed)
[facility] DEPT MHP Provider Note   CSN: [id number] Arrival date & time: 09/15/16  1501  By signing my name below, I, Dora Sims, attest that this documentation has been prepared under the direction and in the presence of physician practitioner, Julianne Rice, MD. Electronically Signed: Dora Sims, Scribe. 09/15/2016. 3:43 PM.  History   Chief Complaint Chief Complaint  Patient presents with  . Epistaxis    The history is provided by the patient. No language interpreter was used.     HPI Comments: Alec [name] is a 55 y.o. male who presents to the Emergency Department complaining of sudden onset, constant, epistaxis beginning a couple of hours ago. He states his bleeding is from his left nostril. No known cause of his nosebleed today. He notes a similar episode occurred this past October, also from his left nostril; he had packing placed in the ER and was advised to follow up with an ENT but did not. He is anticoagulated with Effient since a coronary stent placement and has not taken his dosage today. He denies postnasal drip or any other associated symptoms. No lightheadedness, weakness or headache. Past Medical History:  Diagnosis Date  . Arthritis   . CAD (coronary artery disease)    a. 04/27/14 DES to dRCA; widely patent L coronary system b. cath 08/13/2015 no changes, patent RCA  . Chronic lower back pain   . Depression   . Gout   . History of echocardiogram    a. 04/27/14 with EF 65-70% and mild TR.  Marland Kitchen History of pancreatitis   . Metabolic syndrome   . Mixed hyperlipidemia   . Obesity (BMI 30-39.9) 02/16/2016  . OSA (obstructive sleep apnea) 11/26/2015   Severe with total AHI 104.8/hr and oxygen desaturations as low as 79%.  On CPAP at 13cm H2O  . Type 2 diabetes mellitus (HCC)    on Insulin pump    Patient Active Problem List   Diagnosis Date Noted  . Obesity (BMI 30-39.9) 02/16/2016  . OSA (obstructive sleep apnea) 11/26/2015  . Controlled type 2  diabetes mellitus ([place])   . Essential hypertension   . Acute chest pain 08/12/2015  . Hypertriglyceridemia 04/28/2014  . CAD (coronary artery disease)   . Metabolic syndrome   . History of echocardiogram   . Depression   . Gout   . Unstable angina ([place]) 04/26/2014  . Type 2 diabetes mellitus ([place]) 04/26/2014  . Mixed hyperlipidemia 04/26/2014    Past Surgical History:  Procedure Laterality Date  . CARDIAC CATHETERIZATION N/A 08/14/2015   Procedure: Left Heart Cath and Coronary Angiography;  Surgeon: Belva Crome, MD;  Location: [facility] CV LAB;  Service: Cardiovascular;  Laterality: N/A;  . CARPAL TUNNEL RELEASE Bilateral 2006-2008   left 12/2004; right 10/2006  . CHONDROPLASTY  04/20/2015   Procedure: CHONDROPLASTY;  Surgeon: Melrose Nakayama, MD;  Location: [facility];  Service: Orthopedics;;  . FUNCTIONAL ENDOSCOPIC SINUS SURGERY  10/2001; 08/2005;   . KNEE ARTHROSCOPY Bilateral 2000's   right 08/2003;   . KNEE ARTHROSCOPY WITH MEDIAL MENISECTOMY Left 04/20/2015   Procedure: LEFT ARTHROSCOPY KNEE WITH MEDIAL MENISECTOMY;  Surgeon: Melrose Nakayama, MD;  Location: [facility];  Service: Orthopedics;  Laterality: Left;  Partial medial menisectomy, chondroplasty.  Marland Kitchen LEFT HEART CATHETERIZATION WITH CORONARY ANGIOGRAM N/A 04/27/2014   Procedure: LEFT HEART CATHETERIZATION WITH CORONARY ANGIOGRAM;  Surgeon: [name], MD;  Location: [facility] CATH LAB;  Service: Cardiovascular;  Laterality: N/A;  . LUMBAR LAMINECTOMY  Left 05/2006  . PERCUTANEOUS STENT INTERVENTION  04/27/2014   Procedure: PERCUTANEOUS STENT INTERVENTION;  Surgeon: [name], MD;  Location: [facility] CATH LAB;  Service: Cardiovascular;;  DES x2 (distal +prox RCA)  . POSTERIOR LUMBAR FUSION  07/2009  . SHOULDER ARTHROSCOPY Right ~ 2010       Home Medications    Prior to Admission medications   Medication Sig Start Date End Date Taking? Authorizing Provider  Alirocumab (PRALUENT [place]) Inject  into the skin as directed.    Historical Provider, MD  allopurinol (ZYLOPRIM) 300 MG tablet Take 1 tablet (300 mg total) by mouth daily. 04/28/14 11/01/16  Eileen Stanford, PA-C  amoxicillin-clavulanate (AUGMENTIN) 875-125 MG tablet Take 1 tablet by mouth 2 (two) times daily. One po bid x 7 days 06/22/16   Sherwood Gambler, MD  aspirin EC 81 MG tablet Take 81 mg by mouth daily.    Historical Provider, MD  atorvastatin (LIPITOR) 80 MG tablet Take 80 mg by mouth at bedtime. 07/28/15   Historical Provider, MD  cetirizine (ZYRTEC) 10 MG tablet Take 10 mg by mouth daily.    Historical Provider, MD  EFFIENT 10 MG TABS tablet TAKE 1 TABLET (10 MG) BY MOUTH DAILY. 09/19/15   Belva Crome, MD  Empagliflozin (JARDIANCE) 25 MG TABS Take 25 mg by mouth daily.    Historical Provider, MD  escitalopram (LEXAPRO) 20 MG tablet TAKE 1 TABLET (20 MG) BY MOUTH DAILY. 06/23/15   Belva Crome, MD  fenofibrate 160 MG tablet Take 1 tablet (160 mg total) by mouth daily. Please call and schedule an appointment with Dr Tamala Julian 05/14/16   Almyra Deforest, PA  HYDROcodone-acetaminophen [facility]) 10-325 MG tablet Take 1 tablet by mouth every 6 (six) hours as needed for moderate pain or severe pain.  05/17/15   Historical Provider, MD  insulin regular human CONCENTRATED (HUMULIN R) 500 UNIT/ML SOLN injection Inject 500 Units into the skin 4 (four) times daily. 1.25 units per hour, bolus as needed blood suger    Historical Provider, MD  metoprolol succinate (TOPROL-XL) 25 MG 24 hr tablet Take 25 mg by mouth daily. 07/28/15   Historical Provider, MD  montelukast (SINGULAIR) 10 MG tablet Take 10 mg by mouth at bedtime. 07/28/15   Historical Provider, MD  NITROSTAT 0.4 MG SL tablet PLACE 1 TABLET (0.4 MG TOTAL) UNDER THE TONGUE EVERY 5 (FIVE) MINUTES X 3 DOSES AS NEEDED FOR CHEST PAIN. DIAL 911 AFTER 3 DOSES 08/16/15   Eileen Stanford, PA-C  ONE TOUCH ULTRA TEST test strip 1 each by Other route as needed for other (blood sugar).  08/16/15    Historical Provider, MD  ramipril (ALTACE) 10 MG capsule Take 10 mg by mouth daily.  07/28/15   Historical Provider, MD  VASCEPA 1 G CAPS Take 2 g by mouth 2 (two) times daily.  03/03/14   Historical Provider, MD    Family History Family History  Problem Relation Age of Onset  . Coronary artery disease Mother     MI in her 42's  . Heart attack Mother   . Diabetes Mother   . Prostate cancer Father   . Lung cancer Father   . Diabetes Father   . Colon cancer Neg Hx     Social History Social History  Substance Use Topics  . Smoking status: Never Smoker  . Smokeless tobacco: Never Used  . Alcohol use Yes     Comment: occ     Allergies  Percocet [oxycodone-acetaminophen]   Review of Systems Review of Systems  Constitutional: Negative for chills and fever.  HENT: Positive for nosebleeds. Negative for facial swelling.   Skin: Negative for wound.  Neurological: Negative for weakness, light-headedness and headaches.  All other systems reviewed and are negative.    Physical Exam Updated Vital Signs BP 155/99 (BP Location: Right Arm)   Pulse 75   Temp 97.7 F (36.5 C) (Oral)   Resp 20   Ht 5\' 11"  (1.803 m)   Wt 245 lb (111.1 kg)   SpO2 96%   BMI 34.17 kg/m   Physical Exam  Constitutional: He is oriented to person, place, and time. He appears well-developed and well-nourished. No distress.  HENT:  Head: Normocephalic and atraumatic.  Mouth/Throat: Oropharynx is clear and moist. No oropharyngeal exudate.  Friability to the left nasal septum. No obvious active bleeding. Patient coughed up a clot but no bright red blood. Denies blood in the back of the throat.  Eyes: EOM are normal. Pupils are equal, round, and reactive to light.  Neck: Normal range of motion. Neck supple.  Cardiovascular: Normal rate.   Pulmonary/Chest: Effort normal.  Abdominal: Soft.  Musculoskeletal: Normal range of motion. He exhibits no edema or tenderness.  Neurological: He is alert and  oriented to person, place, and time.  Moving all extremities without deficit. Sensation intact.  Skin: Skin is warm and dry. Capillary refill takes less than 2 seconds. No rash noted. No erythema.  Psychiatric: He has a normal mood and affect. His behavior is normal.  Nursing note and vitals reviewed.    ED Treatments / Results  Labs (all labs ordered are listed, but only abnormal results are displayed) Labs Reviewed - No data to display  EKG  EKG Interpretation None       Radiology No results found.  Procedures Procedures (including critical care time)  DIAGNOSTIC STUDIES: Oxygen Saturation is 96% on RA, adequate by my interpretation.    COORDINATION OF CARE: 3:49 PM Discussed treatment plan with pt at bedside and pt agreed to plan.  Medications Ordered in ED Medications  oxymetazoline (AFRIN) 0.05 % nasal spray (not administered)  oxymetazoline (AFRIN) 0.05 % nasal spray 1 spray (1 spray Each Nare Given 09/15/16 1515)     Initial Impression / Assessment and Plan / ED Course  I have reviewed the triage vital signs and the nursing notes.  Pertinent labs & imaging results that were available during my care of the patient were reviewed by me and considered in my medical decision making (see chart for details).  Clinical Course     No active bleeding in the emergency department. We will observe for rebleeding. Bleeding is controlled. Advised to follow with his ENT. Return precautions given. Final Clinical Impressions(s) / ED Diagnoses   Final diagnoses:  Left-sided epistaxis    New Prescriptions New Prescriptions   No medications on file   I personally performed the services described in this documentation, which was scribed in my presence. The recorded information has been reviewed and is accurate.      Julianne Rice, MD 09/15/16 [phone]

## 2016-09-15 NOTE — ED Notes (Signed)
Patient stated his left nostril started bleeding about 1pm today.  Patient is on a blood thinner Effent.  Patient sitting with a nasal forceps in place and Afrin has already been used.  Left nostril continues to drain small amount of blood.   Stated when it happened before he was to follow up with the ENT but he didn't.

## 2016-09-15 NOTE — ED Notes (Signed)
Discharge instructions reviewed - voiced understanding 

## 2016-09-15 NOTE — ED Notes (Signed)
Dr Lita Mains was in to remove clip and instructed patient to replace if his nose started bleeding again.  Upon entering the room noticed the clip was back on the nose. Patient stated his left nostril started bleeding so he replaced the clip.  Small amount of blood noted on the 4X4

## 2016-09-15 NOTE — ED Notes (Signed)
Removed clip from nose.

## 2016-10-10 ENCOUNTER — Other Ambulatory Visit: Payer: Self-pay | Admitting: Interventional Cardiology

## 2016-10-10 MED FILL — METOPROLOL SUCC ER 25 MG TA: 25 | 30 days supply | Qty: 30 | Fill #4

## 2016-10-10 MED FILL — JARDIANCE 25 MG TABLET: 25 | 30 days supply | Qty: 30 | Fill #6

## 2016-10-10 MED FILL — FENOFIBRATE 160 MG TABLET: 160 | 30 days supply | Qty: 30 | Fill #4

## 2016-10-10 MED FILL — HUMULIN R 500 UNITS/ML VIAL: 500 | 30 days supply | Qty: 20 | Fill #7

## 2016-10-10 MED FILL — VASCEPA 1 GM CAPSULE: 1 | 30 days supply | Qty: 120 | Fill #6

## 2016-10-10 MED FILL — RAMIPRIL 10 MG CAPSULE: 10 | 30 days supply | Qty: 30 | Fill #0

## 2016-10-10 MED FILL — ESCITALOPRAM 20 MG TABLET: 20 | 30 days supply | Qty: 30 | Fill #3

## 2016-10-10 MED FILL — ALLOPURINOL 300 MG TABLET: 300 | 30 days supply | Qty: 30 | Fill #6

## 2016-10-10 MED FILL — ONE TOUCH ULTRA TEST STRIPS: 30 days supply | Qty: 300 | Fill #2

## 2016-10-11 MED FILL — HYDROCODON-APAP 10-325: 10-325 | 30 days supply | Qty: 120 | Fill #0

## 2016-10-11 MED FILL — EFFIENT 10 MG TABLET: 10 | 30 days supply | Qty: 30 | Fill #0

## 2016-11-08 MED FILL — ATORVASTATIN 80 MG TABLET: 80 | 30 days supply | Qty: 30 | Fill #0

## 2016-11-08 MED FILL — HYDROCODON-APAP 10-325: 10-325 | 30 days supply | Qty: 120 | Fill #0

## 2016-11-12 ENCOUNTER — Telehealth: Payer: Self-pay | Admitting: Cardiology

## 2016-11-12 ENCOUNTER — Other Ambulatory Visit: Payer: Self-pay | Admitting: *Deleted

## 2016-11-12 MED ORDER — EFFIENT 10 MG PO TABS: 10.0000 mg | ORAL_TABLET | Freq: Every day | ORAL | 0 refills | Status: DC

## 2016-11-12 MED FILL — ONE TOUCH ULTRA TEST STRIPS: 30 days supply | Qty: 300 | Fill #3

## 2016-11-12 MED FILL — FENOFIBRATE 160 MG TABLET: 160 | 30 days supply | Qty: 30 | Fill #5

## 2016-11-12 MED FILL — VASCEPA 1 GM CAPSULE: 1 | 30 days supply | Qty: 120 | Fill #0

## 2016-11-12 MED FILL — ESCITALOPRAM 20 MG TABLET: 20 | 30 days supply | Qty: 30 | Fill #4

## 2016-11-12 MED FILL — HUMULIN R 500 UNITS/ML VIAL: 500 | 30 days supply | Qty: 20 | Fill #8

## 2016-11-12 MED FILL — METOPROLOL SUCC ER 25 MG TA: 25 | 30 days supply | Qty: 30 | Fill #5

## 2016-11-12 MED FILL — RAMIPRIL 10 MG CAPSULE: 10 | 30 days supply | Qty: 30 | Fill #1

## 2016-11-12 MED FILL — JARDIANCE 25 MG TABLET: 25 | 30 days supply | Qty: 30 | Fill #0

## 2016-11-12 MED FILL — ALLOPURINOL 300 MG TABLET: 300 | 30 days supply | Qty: 30 | Fill #0

## 2016-11-12 NOTE — Telephone Encounter (Signed)
New message    *STAT* If patient is at the pharmacy, call can be transferred to refill team.   1. Which medications need to be refilled? (please list name of each medication and dose if known) EFFIENT 10 MG TABS tablet   2. Which pharmacy/location (including street and city if local pharmacy) is medication to be sent to? [facility]  3. Do they need a 30 day or 90 day supply? 30 day supply

## 2016-11-14 NOTE — Telephone Encounter (Signed)
Follow up  Pt c/o medication issue:  1. Name of Medication: Effient 10 mg tablets once daily  2. How are you currently taking this medication (dosage and times per day)? See above  3. Are you having a reaction (difficulty breathing--STAT)? N/A  4. What is your medication issue? Pt voiced his new insurance does not cover this medication and would like to switch back to his medication and have refilled Brilinta.  Please f/u with pt

## 2016-11-14 NOTE — Telephone Encounter (Signed)
Spoke with pt and he is wanting to switch back to Brilinta from Effient.  Pt's insurance no longer covers the Effient.  Advised I will send message to Dr. Tamala Julian for review and advisement on switching medications.  Scheduled pt for annual appt with Dr. Tamala Julian as well.  Pt appreciative for assistance.

## 2016-11-15 NOTE — Telephone Encounter (Signed)
Switch to plavix 75 mg daily.

## 2016-11-18 MED ORDER — CLOPIDOGREL BISULFATE 75 MG PO TABS: 75.0000 mg | ORAL_TABLET | Freq: Every day | ORAL | 3 refills | Status: DC

## 2016-11-18 MED FILL — CLOPIDOGREL 75 MG TABLET: 75 | 30 days supply | Qty: 30 | Fill #0

## 2016-11-18 NOTE — Telephone Encounter (Signed)
Spoke with pt and advised him of medication change.  Pt verbalized understanding and was in agreement with this plan.

## 2016-11-18 NOTE — Telephone Encounter (Signed)
Left message to call back  

## 2016-12-11 ENCOUNTER — Other Ambulatory Visit: Payer: Self-pay | Admitting: Physician Assistant

## 2016-12-11 MED FILL — ONE TOUCH ULTRA TEST STRIPS: 30 days supply | Qty: 300 | Fill #4

## 2016-12-11 MED FILL — ALLOPURINOL 300 MG TABLET: 300 | 30 days supply | Qty: 30 | Fill #1

## 2016-12-11 MED FILL — ATORVASTATIN 80 MG TABLET: 80 | 30 days supply | Qty: 30 | Fill #1

## 2016-12-11 MED FILL — VASCEPA 1 GM CAPSULE: 1 | 30 days supply | Qty: 120 | Fill #1

## 2016-12-11 MED FILL — ESCITALOPRAM 20 MG TABLET: 20 | 30 days supply | Qty: 30 | Fill #5

## 2016-12-11 MED FILL — JARDIANCE 25 MG TABLET: 25 | 30 days supply | Qty: 30 | Fill #1

## 2016-12-11 MED FILL — METOPROLOL SUCC ER 25 MG TA: 25 | 30 days supply | Qty: 30 | Fill #6

## 2016-12-11 MED FILL — RAMIPRIL 10 MG CAPSULE: 10 | 30 days supply | Qty: 30 | Fill #2

## 2016-12-11 MED FILL — CLOPIDOGREL 75 MG TABLET: 75 | 30 days supply | Qty: 30 | Fill #1

## 2016-12-11 MED FILL — HUMULIN R 500 UNITS/ML VIAL: 500 | 30 days supply | Qty: 20 | Fill #9

## 2016-12-11 NOTE — Telephone Encounter (Signed)
Please advise for refill, thanks 

## 2016-12-12 ENCOUNTER — Other Ambulatory Visit: Payer: Self-pay | Admitting: *Deleted

## 2016-12-12 MED ORDER — FENOFIBRATE 160 MG PO TABS: 160.0000 mg | ORAL_TABLET | Freq: Every day | ORAL | 0 refills | Status: DC

## 2016-12-12 MED FILL — FENOFIBRATE 160 MG TABLET: 160 | 30 days supply | Qty: 30 | Fill #0

## 2016-12-17 MED FILL — HYDROCODON-APAP 10-325: 10-325 | 30 days supply | Qty: 120 | Fill #0

## 2016-12-31 ENCOUNTER — Ambulatory Visit (INDEPENDENT_AMBULATORY_CARE_PROVIDER_SITE_OTHER): Payer: PRIVATE HEALTH INSURANCE | Admitting: Interventional Cardiology

## 2016-12-31 ENCOUNTER — Encounter: Payer: Self-pay | Admitting: Interventional Cardiology

## 2016-12-31 VITALS — BP 138/84 | HR 76 | Ht 71.0 in | Wt 268.0 lb

## 2016-12-31 DIAGNOSIS — G4733 Obstructive sleep apnea (adult) (pediatric): Secondary | ICD-10-CM | POA: Diagnosis not present

## 2016-12-31 DIAGNOSIS — E782 Mixed hyperlipidemia: Secondary | ICD-10-CM

## 2016-12-31 DIAGNOSIS — I25118 Atherosclerotic heart disease of native coronary artery with other forms of angina pectoris: Secondary | ICD-10-CM

## 2016-12-31 DIAGNOSIS — I10 Essential (primary) hypertension: Secondary | ICD-10-CM | POA: Diagnosis not present

## 2016-12-31 NOTE — Progress Notes (Signed)
Cardiology Office Note    Date:  12/31/2016   ID:  Alec [name], DOB 07/29/1961, MRN [id number]  PCP:  Sheela Stack, MD  Cardiologist: [name], MD   Chief Complaint  Patient presents with  . Coronary Artery Disease  . Congestive Heart Failure    History of Present Illness:  Alec [name] is a 56 y.o. male who is now doing well status post PCI of the RCA. He presented with unstable angina and marked exertional dyspnea and chest tightness. Repeat cath was performed in 2016 demonstrating widely patent stents. Initial intervention on the right coronary was in 2015 where a mid and distal RCA DES was placed.  He is asymptomatic. He is active without limitations. He denies orthopnea, PND, syncope, and medication side effects.   Past Medical History:  Diagnosis Date  . Arthritis   . CAD (coronary artery disease)    a. 04/27/14 DES to dRCA; widely patent L coronary system b. cath 08/13/2015 no changes, patent RCA  . Chronic lower back pain   . Depression   . Gout   . History of echocardiogram    a. 04/27/14 with EF 65-70% and mild TR.  Marland Kitchen History of pancreatitis   . Metabolic syndrome   . Mixed hyperlipidemia   . Obesity (BMI 30-39.9) 02/16/2016  . OSA (obstructive sleep apnea) 11/26/2015   Severe with total AHI 104.8/hr and oxygen desaturations as low as 79%.  On CPAP at 13cm H2O  . Type 2 diabetes mellitus (HCC)    on Insulin pump    Past Surgical History:  Procedure Laterality Date  . CARDIAC CATHETERIZATION N/A 08/14/2015   Procedure: Left Heart Cath and Coronary Angiography;  Surgeon: Belva Crome, MD;  Location: [facility];  Service: Cardiovascular;  Laterality: N/A;  . CARPAL TUNNEL RELEASE Bilateral 2006-2008   left 12/2004; right 10/2006  . CHONDROPLASTY  04/20/2015   Procedure: CHONDROPLASTY;  Surgeon: Melrose Nakayama, MD;  Location: [facility];  Service: Orthopedics;;  . FUNCTIONAL ENDOSCOPIC SINUS SURGERY  10/2001; 08/2005;   . KNEE  ARTHROSCOPY Bilateral 2000's   right 08/2003;   . KNEE ARTHROSCOPY WITH MEDIAL MENISECTOMY Left 04/20/2015   Procedure: LEFT ARTHROSCOPY KNEE WITH MEDIAL MENISECTOMY;  Surgeon: Melrose Nakayama, MD;  Location: [facility];  Service: Orthopedics;  Laterality: Left;  Partial medial menisectomy, chondroplasty.  Marland Kitchen LEFT HEART CATHETERIZATION WITH CORONARY ANGIOGRAM N/A 04/27/2014   Procedure: LEFT HEART CATHETERIZATION WITH CORONARY ANGIOGRAM;  Surgeon: [name], MD;  Location: [facility];  Service: Cardiovascular;  Laterality: N/A;  . LUMBAR LAMINECTOMY Left 05/2006  . PERCUTANEOUS STENT INTERVENTION  04/27/2014   Procedure: PERCUTANEOUS STENT INTERVENTION;  Surgeon: [name], MD;  Location: [facility] CATH LAB;  Service: Cardiovascular;;  DES x2 (distal +prox RCA)  . POSTERIOR LUMBAR FUSION  07/2009  . SHOULDER ARTHROSCOPY Right ~ 2010    Current Medications: Outpatient Medications Prior to Visit  Medication Sig Dispense Refill  . Alirocumab (PRALUENT [place]) Inject into the skin as directed.    Marland Kitchen aspirin EC 81 MG tablet Take 81 mg by mouth daily.    Marland Kitchen atorvastatin (LIPITOR) 80 MG tablet Take 80 mg by mouth at bedtime.  8  . cetirizine (ZYRTEC) 10 MG tablet Take 10 mg by mouth daily.    . clopidogrel (PLAVIX) 75 MG tablet Take 1 tablet (75 mg total) by mouth daily. 90 tablet 3  . Empagliflozin (JARDIANCE) 25 MG TABS  Take 25 mg by mouth daily.    Marland Kitchen escitalopram (LEXAPRO) 20 MG tablet TAKE 1 TABLET (20 MG) BY MOUTH DAILY. 30 tablet 0  . fenofibrate 160 MG tablet Take 1 tablet (160 mg total) by mouth daily. 90 tablet 0  . HYDROcodone-acetaminophen (NORCO) 10-325 MG tablet Take 1 tablet by mouth every 6 (six) hours as needed for moderate pain or severe pain.   0  . insulin regular human CONCENTRATED (HUMULIN R) 500 UNIT/ML SOLN injection Inject 500 Units into the skin 4 (four) times daily. 1.25 units per hour, bolus as needed blood suger    . metoprolol succinate (TOPROL-XL) 25 MG 24  hr tablet Take 25 mg by mouth daily.  7  . montelukast (SINGULAIR) 10 MG tablet Take 10 mg by mouth at bedtime.  5  . NITROSTAT 0.4 MG SL tablet PLACE 1 TABLET (0.4 MG TOTAL) UNDER THE TONGUE EVERY 5 (FIVE) MINUTES X 3 DOSES AS NEEDED FOR CHEST PAIN. DIAL 911 AFTER 3 DOSES 25 tablet 1  . ONE TOUCH ULTRA TEST test strip 1 each by Other route as needed for other (blood sugar).   6  . ramipril (ALTACE) 10 MG capsule Take 10 mg by mouth daily.   6  . VASCEPA 1 G CAPS Take 2 g by mouth 2 (two) times daily.     Marland Kitchen allopurinol (ZYLOPRIM) 300 MG tablet Take 1 tablet (300 mg total) by mouth daily. 30 tablet 11  . amoxicillin-clavulanate (AUGMENTIN) 875-125 MG tablet Take 1 tablet by mouth 2 (two) times daily. One po bid x 7 days 14 tablet 0   No facility-administered medications prior to visit.      Allergies:   Percocet [oxycodone-acetaminophen]   Social History   Social History  . Marital status: Married    Spouse name: N/A  . Number of children: 2  . Years of education: N/A   Occupational History  . Actuary    Social History Main Topics  . Smoking status: Never Smoker  . Smokeless tobacco: Never Used  . Alcohol use Yes     Comment: occ  . Drug use: No  . Sexual activity: Not Asked   Other Topics Concern  . None   Social History Narrative  . None     Family History:  The patient's family history includes Coronary artery disease in his mother; Diabetes in his father and mother; Heart attack in his mother; Lung cancer in his father; Prostate cancer in his father.   ROS:   Please see the history of present illness.    None  All other systems reviewed and are negative.   PHYSICAL EXAM:   VS:  BP 138/84 (BP Location: Left Arm)   Pulse 76   Ht 5\' 11"  (1.803 m)   Wt 268 lb (121.6 kg)   BMI 37.38 kg/m    GEN: Well nourished, well developed, in no acute distress  HEENT: normal  Neck: no JVD, carotid bruits, or masses Cardiac: RRR; no murmurs, rubs, or gallops,no edema   Respiratory:  clear to auscultation bilaterally, normal work of breathing GI: soft, nontender, nondistended, + BS MS: no deformity or atrophy  Skin: warm and dry, no rash Neuro:  Alert and Oriented x 3, Strength and sensation are intact Psych: euthymic mood, full affect  Wt Readings from Last 3 Encounters:  12/31/16 268 lb (121.6 kg)  09/15/16 245 lb (111.1 kg)  06/22/16 245 lb (111.1 kg)      Studies/Labs Reviewed:  EKG:  EKG  Normal sinus rhythm with normal appearance.  Recent Labs: 06/22/2016: Hemoglobin 15.4; Platelets 234   Lipid Panel    Component Value Date/Time   CHOL 349 (H) 10/13/2015 0858   TRIG 2,598 (H) 10/13/2015 0858   HDL 27 (L) 10/13/2015 0858   CHOLHDL 12.9 (H) 10/13/2015 0858   VLDL NOT CALC 10/13/2015 0858   LDLCALC NOT CALC 10/13/2015 0858   LDLDIRECT 65 10/13/2015 0858    Additional studies/ records that were reviewed today include:  Cardiac catheterization 2016: Coronary Diagrams   Diagnostic Diagram           ASSESSMENT:    1. Coronary artery disease involving native coronary artery of native heart with other form of angina pectoris ([place])   2. Essential hypertension   3. OSA (obstructive sleep apnea)   4. Mixed hyperlipidemia      PLAN:  In order of problems listed above:  1. Coronary disease is stable. Prior mid and distal RCA stents. Documented to be widely patent months after implantation with repeat cath. Plan to discontinue Plavix at this time. Call if any angina or other complaints. One-year clinical follow-up. 2. 2 g sodium diet. Target blood pressure less than 140/90 mmHg. 3. Continue using positive pressure for management. 4. Lipids are monitored by primary care as well as diabetes management.  Clinically doing quite well without recurrent angina. Plavix discontinued. Clinical follow-up in one year.  Medication Adjustments/Labs and Tests Ordered: Current medicines are reviewed at length with the patient today.   Concerns regarding medicines are outlined above.  Medication changes, Labs and Tests ordered today are listed in the Patient Instructions below. There are no Patient Instructions on file for this visit.   Signed, [name], MD  12/31/2016 10:28 AM    [address], [place], [place]  [address] Phone: [phone]; Fax: [phone]

## 2016-12-31 NOTE — Patient Instructions (Signed)
Medication Instructions:  1) DISCONTINUE Plavix  Labwork: None  Testing/Procedures: None  Follow-Up: Your physician wants you to follow-up in: 1 year with Dr. Smith.  You will receive a reminder letter in the mail two months in advance. If you don't receive a letter, please call our office to schedule the follow-up appointment.   Any Other Special Instructions Will Be Listed Below (If Applicable).     If you need a refill on your cardiac medications before your next appointment, please call your pharmacy.   

## 2017-01-13 MED FILL — ALLOPURINOL 300 MG TABLET: 300 | 30 days supply | Qty: 30 | Fill #2

## 2017-01-13 MED FILL — METOPROLOL SUCC ER 25 MG TA: 25 | 30 days supply | Qty: 30 | Fill #0

## 2017-01-13 MED FILL — FENOFIBRATE 160 MG TABLET: 160 | 30 days supply | Qty: 30 | Fill #1

## 2017-01-13 MED FILL — JARDIANCE 25 MG TABLET: 25 | 30 days supply | Qty: 30 | Fill #2

## 2017-01-13 MED FILL — HUMULIN R 500 UNITS/ML VIAL: 500 | 30 days supply | Qty: 20 | Fill #10

## 2017-01-13 MED FILL — ATORVASTATIN 80 MG TABLET: 80 | 30 days supply | Qty: 30 | Fill #2

## 2017-01-13 MED FILL — ONE TOUCH ULTRA TEST STRIPS: 30 days supply | Qty: 300 | Fill #5

## 2017-01-13 MED FILL — RAMIPRIL 10 MG CAPSULE: 10 | 30 days supply | Qty: 30 | Fill #3

## 2017-01-13 MED FILL — ESCITALOPRAM 20 MG TABLET: 20 | 30 days supply | Qty: 30 | Fill #6

## 2017-01-13 MED FILL — VASCEPA 1 GM CAPSULE: 1 | 30 days supply | Qty: 120 | Fill #2

## 2017-01-14 MED FILL — MONTELUKAST SOD 10 MG TAB: 10 | 30 days supply | Qty: 30 | Fill #2

## 2017-01-14 MED FILL — HYDROCODON-APAP 10-325: 10-325 | 30 days supply | Qty: 120 | Fill #0
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
## 2017-02-12 MED FILL — RAMIPRIL 10 MG CAPSULE: 10 | 30 days supply | Qty: 30 | Fill #4

## 2017-02-12 MED FILL — HUMULIN R 500 UNITS/ML VIAL: 500 | 30 days supply | Qty: 20 | Fill #0

## 2017-02-12 MED FILL — ALLOPURINOL 300 MG TABLET: 300 | 30 days supply | Qty: 30 | Fill #3

## 2017-02-12 MED FILL — ATORVASTATIN 80 MG TABLET: 80 | 30 days supply | Qty: 30 | Fill #3

## 2017-02-12 MED FILL — METOPROLOL SUCC ER 25 MG TA: 25 | 30 days supply | Qty: 30 | Fill #1

## 2017-02-12 MED FILL — JARDIANCE 25 MG TABLET: 25 | 30 days supply | Qty: 30 | Fill #3

## 2017-02-12 MED FILL — VASCEPA 1 GM CAPSULE: 1 | 30 days supply | Qty: 120 | Fill #3

## 2017-02-12 MED FILL — ESCITALOPRAM 20 MG TABLET: 20 | 30 days supply | Qty: 30 | Fill #0

## 2017-02-12 MED FILL — MONTELUKAST SOD 10 MG TAB: 10 | 30 days supply | Qty: 30 | Fill #3

## 2017-02-12 MED FILL — FENOFIBRATE 160 MG TABLET: 160 | 30 days supply | Qty: 30 | Fill #2

## 2017-02-13 MED FILL — HYDROCODON-APAP 10-325: 10-325 | 30 days supply | Qty: 120 | Fill #0

## 2017-02-16 NOTE — Progress Notes (Signed)
Cardiology Office Note    Date:  02/17/2017   ID:  Alec [name], DOB 1960-11-18, MRN [id number]  PCP:  Reynold Bowen, MD  Cardiologist:  Fransico Him, MD   Chief Complaint  Patient presents with  . Sleep Apnea    History of Present Illness:  Alec [name] is a 56 y.o. male with a history of severe OSA with an AHI of 104/hr and oxygen desaturations as low as 79%.  The patient had severe snoring.  He is on CPAP therapy at 13cm H2O.  He is here today for followup and is doing well with his CPAP device.  He tolerates the full face mask and feels the pressure is adequate. He continues to feel rested in the am with less daytime sleepiness.  During the winter he had problems with severe mouth dryness and had to adjust the humidity but then got too much water in the tubing and stopped using it for a while.  He tried using Biotene which has helped.  His compliance is down because after dinner he sits in his chair and falls asleep and sometime he sleeps there all night off his CPAP.     Past Medical History:  Diagnosis Date  . Arthritis   . CAD (coronary artery disease)    a. 04/27/14 DES to dRCA; widely patent L coronary system b. cath 08/13/2015 no changes, patent RCA  . Chronic lower back pain   . Depression   . Gout   . History of echocardiogram    a. 04/27/14 with EF 65-70% and mild TR.  Marland Kitchen History of pancreatitis   . Metabolic syndrome   . Mixed hyperlipidemia   . Obesity (BMI 30-39.9) 02/16/2016  . OSA (obstructive sleep apnea) 11/26/2015   Severe with total AHI 104.8/hr and oxygen desaturations as low as 79%.  On CPAP at 13cm H2O  . Type 2 diabetes mellitus (HCC)    on Insulin pump    Past Surgical History:  Procedure Laterality Date  . CARDIAC CATHETERIZATION N/A 08/14/2015   Procedure: Left Heart Cath and Coronary Angiography;  Surgeon: Belva Crome, MD;  Location: [facility];  Service: Cardiovascular;  Laterality: N/A;  . CARPAL TUNNEL RELEASE Bilateral 2006-2008   left 12/2004; right 10/2006  . CHONDROPLASTY  04/20/2015   Procedure: CHONDROPLASTY;  Surgeon: Melrose Nakayama, MD;  Location: [facility];  Service: Orthopedics;;  . FUNCTIONAL ENDOSCOPIC SINUS SURGERY  10/2001; 08/2005;   . KNEE ARTHROSCOPY Bilateral 2000's   right 08/2003;   . KNEE ARTHROSCOPY WITH MEDIAL MENISECTOMY Left 04/20/2015   Procedure: LEFT ARTHROSCOPY KNEE WITH MEDIAL MENISECTOMY;  Surgeon: Melrose Nakayama, MD;  Location: [facility];  Service: Orthopedics;  Laterality: Left;  Partial medial menisectomy, chondroplasty.  Marland Kitchen LEFT HEART CATHETERIZATION WITH CORONARY ANGIOGRAM N/A 04/27/2014   Procedure: LEFT HEART CATHETERIZATION WITH CORONARY ANGIOGRAM;  Surgeon: [name], MD;  Location: [facility];  Service: Cardiovascular;  Laterality: N/A;  . LUMBAR LAMINECTOMY Left 05/2006  . PERCUTANEOUS STENT INTERVENTION  04/27/2014   Procedure: PERCUTANEOUS STENT INTERVENTION;  Surgeon: [name], MD;  Location: [facility];  Service: Cardiovascular;;  DES x2 (distal +prox RCA)  . POSTERIOR LUMBAR FUSION  07/2009  . SHOULDER ARTHROSCOPY Right ~ 2010    Current Medications: Current Meds  Medication Sig  . Alirocumab (PRALUENT [place]) Inject into the skin as directed.  Marland Kitchen allopurinol (ZYLOPRIM) 300 MG tablet Take 1 tablet (300 mg total) by  mouth daily.  Marland Kitchen aspirin EC 81 MG tablet Take 81 mg by mouth daily.  Marland Kitchen atorvastatin (LIPITOR) 80 MG tablet Take 80 mg by mouth at bedtime.  . cetirizine (ZYRTEC) 10 MG tablet Take 10 mg by mouth daily.  . Empagliflozin (JARDIANCE) 25 MG TABS Take 25 mg by mouth daily.  Marland Kitchen escitalopram (LEXAPRO) 20 MG tablet TAKE 1 TABLET (20 MG) BY MOUTH DAILY.  . fenofibrate 160 MG tablet Take 1 tablet (160 mg total) by mouth daily.  Marland Kitchen HYDROcodone-acetaminophen (NORCO) 10-325 MG tablet Take 1 tablet by mouth every 6 (six) hours as needed for moderate pain or severe pain.   Marland Kitchen insulin regular human CONCENTRATED (HUMULIN R) 500 UNIT/ML SOLN  injection Inject 500 Units into the skin 4 (four) times daily. 1.25 units per hour, bolus as needed blood suger  . Lysine 500 MG CAPS Take 500 mg by mouth daily.  . metoprolol succinate (TOPROL-XL) 25 MG 24 hr tablet Take 25 mg by mouth daily.  . montelukast (SINGULAIR) 10 MG tablet Take 10 mg by mouth at bedtime.  . naproxen sodium (ANAPROX) 220 MG tablet Take 440 mg by mouth daily as needed (pain).  Marland Kitchen NITROSTAT 0.4 MG SL tablet PLACE 1 TABLET (0.4 MG TOTAL) UNDER THE TONGUE EVERY 5 (FIVE) MINUTES X 3 DOSES AS NEEDED FOR CHEST PAIN. DIAL 911 AFTER 3 DOSES  . ONE TOUCH ULTRA TEST test strip 1 each by Other route as needed for other (blood sugar).   . ramipril (ALTACE) 10 MG capsule Take 10 mg by mouth daily.   Marland Kitchen VASCEPA 1 G CAPS Take 2 g by mouth 2 (two) times daily.     Allergies:   Percocet [oxycodone-acetaminophen]   Social History   Social History  . Marital status: Married    Spouse name: N/A  . Number of children: 2  . Years of education: N/A   Occupational History  . Actuary    Social History Main Topics  . Smoking status: Never Smoker  . Smokeless tobacco: Never Used  . Alcohol use Yes     Comment: occ  . Drug use: No  . Sexual activity: Not Asked   Other Topics Concern  . None   Social History Narrative  . None     Family History:  The patient's family history includes Coronary artery disease in his mother; Diabetes in his father and mother; Heart attack in his mother; Lung cancer in his father; Prostate cancer in his father.   ROS:   Please see the history of present illness.    ROS All other systems reviewed and are negative.  No flowsheet data found.     PHYSICAL EXAM:   VS:  BP 122/64   Pulse 69   Ht 5\' 11"  (1.803 m)   Wt 264 lb (119.7 kg)   SpO2 96%   BMI 36.82 kg/m    GEN: Well nourished, well developed, in no acute distress  HEENT: normal  Neck: no JVD, carotid bruits, or masses Cardiac: RRR; no murmurs, rubs, or gallops,no edema.   Intact distal pulses bilaterally.  Respiratory:  clear to auscultation bilaterally, normal work of breathing GI: soft, nontender, nondistended, + BS MS: no deformity or atrophy  Skin: warm and dry, no rash Neuro:  Alert and Oriented x 3, Strength and sensation are intact Psych: euthymic mood, full affect  Wt Readings from Last 3 Encounters:  02/17/17 264 lb (119.7 kg)  12/31/16 268 lb (121.6 kg)  09/15/16 245 lb (  111.1 kg)      Studies/Labs Reviewed:   EKG:  EKG isnot ordered today.   Recent Labs: 06/22/2016: Hemoglobin 15.4; Platelets 234   Lipid Panel    Component Value Date/Time   CHOL 349 (H) 10/13/2015 0858   TRIG 2,598 (H) 10/13/2015 0858   HDL 27 (L) 10/13/2015 0858   CHOLHDL 12.9 (H) 10/13/2015 0858   VLDL NOT CALC 10/13/2015 0858   LDLCALC NOT CALC 10/13/2015 0858   LDLDIRECT 65 10/13/2015 0858    Additional studies/ records that were reviewed today include:  CPAP download    ASSESSMENT:    1. OSA (obstructive sleep apnea)   2. Obesity (BMI 30-39.9)      PLAN:  In order of problems listed above:  OSA - the patient is tolerating PAP therapy well without any problems. The PAP download was reviewed today and showed an AHI of 0.3/hr on 13 cm H2O with 30% compliance in using more than 4 hours nightly.  The patient has been using and benefiting from CPAP use and will continue to benefit from therapy. I have encouraged him to be more compliant with his CPAP device. .   Obesity - I have encouraged him to get into a routine exercise program and cut back on carbs and portions.     Medication Adjustments/Labs and Tests Ordered: Current medicines are reviewed at length with the patient today.  Concerns regarding medicines are outlined above.  Medication changes, Labs and Tests ordered today are listed in the Patient Instructions below.  There are no Patient Instructions on file for this visit.   Signed, Fransico Him, MD  02/17/2017 9:31 AM    [facility] [address], [place], [place]  [address] Phone: [phone]; Fax: [phone]

## 2017-02-17 ENCOUNTER — Encounter: Payer: Self-pay | Admitting: Cardiology

## 2017-02-17 ENCOUNTER — Ambulatory Visit (INDEPENDENT_AMBULATORY_CARE_PROVIDER_SITE_OTHER): Payer: PRIVATE HEALTH INSURANCE | Admitting: Cardiology

## 2017-02-17 VITALS — BP 122/64 | HR 69 | Ht 71.0 in | Wt 264.0 lb

## 2017-02-17 DIAGNOSIS — E669 Obesity, unspecified: Secondary | ICD-10-CM

## 2017-02-17 DIAGNOSIS — G4733 Obstructive sleep apnea (adult) (pediatric): Secondary | ICD-10-CM

## 2017-02-17 NOTE — Patient Instructions (Signed)

## 2017-03-13 ENCOUNTER — Other Ambulatory Visit: Payer: Self-pay | Admitting: Physician Assistant

## 2017-03-13 MED FILL — ALLOPURINOL 300 MG TABLET: 300 | 30 days supply | Qty: 30 | Fill #4

## 2017-03-13 MED FILL — METOPROLOL SUCC ER 25 MG TA: 25 | 30 days supply | Qty: 30 | Fill #2

## 2017-03-13 MED FILL — ESCITALOPRAM 20 MG TABLET: 20 | 30 days supply | Qty: 30 | Fill #1

## 2017-03-13 MED FILL — HUMULIN R 500 UNITS/ML VIAL: 500 | 30 days supply | Qty: 20 | Fill #1

## 2017-03-13 MED FILL — HYDROCODON-APAP 10-325: 10-325 | 30 days supply | Qty: 120 | Fill #0

## 2017-03-13 MED FILL — ATORVASTATIN 80 MG TABLET: 80 | 30 days supply | Qty: 30 | Fill #4

## 2017-03-13 MED FILL — RAMIPRIL 10 MG CAPSULE: 10 | 30 days supply | Qty: 30 | Fill #5

## 2017-03-13 MED FILL — JARDIANCE 25 MG TABLET: 25 | 30 days supply | Qty: 30 | Fill #4

## 2017-03-13 MED FILL — VASCEPA 1 GM CAPSULE: 1 | 30 days supply | Qty: 120 | Fill #4

## 2017-03-13 NOTE — Telephone Encounter (Signed)
Please review for refill, Thanks !  

## 2017-03-21 MED FILL — FENOFIBRATE 160 MG TABLET: 160 | 30 days supply | Qty: 30 | Fill #0

## 2017-03-27 MED FILL — valACYclovir HCL 1 GM TABS: 1 | 8 days supply | Qty: 30 | Fill #0

## 2017-04-11 MED FILL — ESCITALOPRAM 20 MG TABLET: 20 | 30 days supply | Qty: 30 | Fill #2

## 2017-04-11 MED FILL — METOPROLOL SUCC ER 25 MG TA: 25 | 30 days supply | Qty: 30 | Fill #3

## 2017-04-11 MED FILL — HUMULIN R 500 UNITS/ML VIAL: 500 | 30 days supply | Qty: 20 | Fill #2

## 2017-04-11 MED FILL — ATORVASTATIN 80 MG TABLET: 80 | 30 days supply | Qty: 30 | Fill #5

## 2017-04-11 MED FILL — ALLOPURINOL 300 MG TABLET: 300 | 30 days supply | Qty: 30 | Fill #5

## 2017-04-11 MED FILL — HYDROCODON-APAP 10-325: 10-325 | 30 days supply | Qty: 120 | Fill #0

## 2017-04-11 MED FILL — JARDIANCE 25 MG TABLET: 25 | 30 days supply | Qty: 30 | Fill #5

## 2017-04-11 MED FILL — VASCEPA 1 GM CAPSULE: 1 | 30 days supply | Qty: 120 | Fill #5

## 2017-05-06 MED FILL — MUPIROCIN 2% OINTMENT: 2 | 30 days supply | Qty: 22 | Fill #0

## 2017-05-15 MED FILL — FENOFIBRATE 160 MG TABLET: 160 | 30 days supply | Qty: 30 | Fill #1

## 2017-05-15 MED FILL — VASCEPA 1 GM CAPSULE: 1 | 30 days supply | Qty: 120 | Fill #6

## 2017-05-15 MED FILL — HUMULIN R 500 UNITS/ML VIAL: 500 | 30 days supply | Qty: 20 | Fill #3

## 2017-05-15 MED FILL — HYDROCODON-APAP 10-325: 10-325 | 30 days supply | Qty: 120 | Fill #0

## 2017-05-15 MED FILL — JARDIANCE 25 MG TABLET: 25 | 30 days supply | Qty: 30 | Fill #6

## 2017-05-15 MED FILL — ATORVASTATIN 80 MG TABLET: 80 | 30 days supply | Qty: 30 | Fill #6

## 2017-05-15 MED FILL — METOPROLOL SUCC ER 25 MG TA: 25 | 30 days supply | Qty: 30 | Fill #4

## 2017-05-15 MED FILL — ALLOPURINOL 300 MG TABLET: 300 | 30 days supply | Qty: 30 | Fill #6

## 2017-05-16 MED FILL — ESCITALOPRAM 20 MG TABLET: 20 | 30 days supply | Qty: 30 | Fill #0

## 2017-06-06 MED FILL — RAMIPRIL 10 MG CAPSULE: 10 | 30 days supply | Qty: 90 | Fill #0

## 2017-06-11 MED FILL — NITROGLYCERIN 0.4 MG TAB SL: 0.4 | 25 days supply | Qty: 25 | Fill #0

## 2017-06-12 MED FILL — ESCITALOPRAM 20 MG TABLET: 20 | 30 days supply | Qty: 30 | Fill #1

## 2017-06-12 MED FILL — FENOFIBRATE 160 MG TABLET: 160 | 30 days supply | Qty: 30 | Fill #2

## 2017-06-12 MED FILL — METOPROLOL SUCC ER 25 MG TA: 25 | 30 days supply | Qty: 30 | Fill #5

## 2017-06-12 MED FILL — ATORVASTATIN 80 MG TABLET: 80 | 30 days supply | Qty: 30 | Fill #7

## 2017-06-12 MED FILL — HUMULIN R 500 UNITS/ML VIAL: 500 | 30 days supply | Qty: 20 | Fill #4

## 2017-06-13 MED FILL — HYDROCODON-APAP 10-325: 10-325 | 30 days supply | Qty: 120 | Fill #0

## 2017-06-13 MED FILL — ALLOPURINOL 300 MG TABS: 300 | 30 days supply | Qty: 30 | Fill #0

## 2017-06-13 MED FILL — VASCEPA 1 GM CAPSULE: 1 | 30 days supply | Qty: 120 | Fill #0

## 2017-06-24 MED FILL — ERYTHROMYCIN EYE OINTMENT: 5 | 15 days supply | Qty: 4 | Fill #0

## 2017-07-11 MED FILL — RAMIPRIL 10 MG CAPSULE: 10 | 30 days supply | Qty: 90 | Fill #1

## 2017-07-11 MED FILL — HYDROCODON-APAP 10-325: 10-325 | 30 days supply | Qty: 120 | Fill #0

## 2017-07-11 MED FILL — ESCITALOPRAM 20 MG TABLET: 20 | 30 days supply | Qty: 30 | Fill #2

## 2017-07-11 MED FILL — VASCEPA 1 GM CAPSULE: 1 | 30 days supply | Qty: 120 | Fill #1

## 2017-07-11 MED FILL — ATORVASTATIN 80 MG TABLET: 80 | 30 days supply | Qty: 30 | Fill #8

## 2017-07-11 MED FILL — HUMULIN R 500 UNITS/ML VIAL: 500 | 30 days supply | Qty: 20 | Fill #5

## 2017-07-11 MED FILL — ALLOPURINOL 300 MG TABS: 300 | 30 days supply | Qty: 30 | Fill #1

## 2017-07-11 MED FILL — FENOFIBRATE 160 MG TABLET: 160 | 30 days supply | Qty: 30 | Fill #3

## 2017-07-14 MED FILL — JARDIANCE 25 MG TABLET: 25 | 30 days supply | Qty: 30 | Fill #0

## 2017-07-14 MED FILL — METOPROLOL SUCC ER 25 MG TA: 25 | 30 days supply | Qty: 30 | Fill #0

## 2017-08-05 MED FILL — ATORVASTATIN 80 MG TABLET: 80 | 30 days supply | Qty: 30 | Fill #9

## 2017-08-05 MED FILL — VASCEPA 1 GM CAPSULE: 1 | 30 days supply | Qty: 120 | Fill #2

## 2017-08-05 MED FILL — FENOFIBRATE 160 MG TABLET: 160 | 30 days supply | Qty: 30 | Fill #4

## 2017-08-05 MED FILL — ESCITALOPRAM 20 MG TABLET: 20 | 30 days supply | Qty: 30 | Fill #3

## 2017-08-05 MED FILL — RAMIPRIL 10 MG CAPSULE: 10 | 30 days supply | Qty: 90 | Fill #2

## 2017-08-05 MED FILL — ALLOPURINOL 300 MG TABS: 300 | 30 days supply | Qty: 30 | Fill #2

## 2017-08-06 MED FILL — METOPROLOL SUCC ER 25 MG TA: 25 | 30 days supply | Qty: 30 | Fill #1

## 2017-08-06 MED FILL — HUMULIN R 500 UNITS/ML VIAL: 500 | 30 days supply | Qty: 20 | Fill #0

## 2017-08-06 MED FILL — JARDIANCE 25 MG TABLET: 25 | 30 days supply | Qty: 30 | Fill #1

## 2017-08-14 MED FILL — HYDROCODON-APAP 10-325: 10-325 | 30 days supply | Qty: 120 | Fill #0

## 2017-08-20 MED FILL — ONE TOUCH ULTRA TEST STRIPS: 30 days supply | Qty: 300 | Fill #0

## 2017-09-11 MED FILL — HYDROCODON-APAP 10-325: 10-325 | 30 days supply | Qty: 120 | Fill #0

## 2017-09-12 MED FILL — RAMIPRIL 10 MG CAPSULE: 10 | 30 days supply | Qty: 30 | Fill #0

## 2017-09-12 MED FILL — ESCITALOPRAM 20 MG TABLET: 20 | 30 days supply | Qty: 30 | Fill #4

## 2017-09-12 MED FILL — JARDIANCE 25 MG TABLET: 25 | 30 days supply | Qty: 30 | Fill #2

## 2017-09-12 MED FILL — METOPROLOL SUCC ER 25 MG TA: 25 | 30 days supply | Qty: 30 | Fill #2

## 2017-09-12 MED FILL — FENOFIBRATE 160 MG TABLET: 160 | 30 days supply | Qty: 30 | Fill #5

## 2017-09-12 MED FILL — ALLOPURINOL 300 MG TABS: 300 | 30 days supply | Qty: 30 | Fill #3

## 2017-09-12 MED FILL — ATORVASTATIN 80 MG TABLET: 80 | 30 days supply | Qty: 30 | Fill #10

## 2017-09-12 MED FILL — VASCEPA 1 GM CAPSULE: 1 | 30 days supply | Qty: 120 | Fill #3

## 2017-09-12 MED FILL — HUMULIN R 500 UNITS/ML VIAL: 500 | 30 days supply | Qty: 20 | Fill #1

## 2017-09-19 ENCOUNTER — Other Ambulatory Visit: Payer: Self-pay

## 2017-09-19 ENCOUNTER — Encounter (HOSPITAL_COMMUNITY): Payer: Self-pay

## 2017-09-19 ENCOUNTER — Emergency Department (HOSPITAL_COMMUNITY): Payer: Worker's Compensation

## 2017-09-19 ENCOUNTER — Emergency Department (HOSPITAL_COMMUNITY)
Admission: EM | Admit: 2017-09-19 | Discharge: 2017-09-19 | Disposition: A | Discharge status: Home / Self Care | Payer: Worker's Compensation | Attending: Emergency Medicine | Admitting: Emergency Medicine

## 2017-09-19 DIAGNOSIS — Z7982 Long term (current) use of aspirin: Secondary | ICD-10-CM | POA: Diagnosis not present

## 2017-09-19 DIAGNOSIS — Z794 Long term (current) use of insulin: Secondary | ICD-10-CM | POA: Insufficient documentation

## 2017-09-19 DIAGNOSIS — W11XXXA Fall on and from ladder, initial encounter: Secondary | ICD-10-CM | POA: Diagnosis not present

## 2017-09-19 DIAGNOSIS — E119 Type 2 diabetes mellitus without complications: Secondary | ICD-10-CM | POA: Insufficient documentation

## 2017-09-19 DIAGNOSIS — Y998 Other external cause status: Secondary | ICD-10-CM | POA: Insufficient documentation

## 2017-09-19 DIAGNOSIS — W19XXXA Unspecified fall, initial encounter: Secondary | ICD-10-CM

## 2017-09-19 DIAGNOSIS — Z9641 Presence of insulin pump (external) (internal): Secondary | ICD-10-CM | POA: Diagnosis not present

## 2017-09-19 DIAGNOSIS — Y929 Unspecified place or not applicable: Secondary | ICD-10-CM | POA: Insufficient documentation

## 2017-09-19 DIAGNOSIS — S0993XA Unspecified injury of face, initial encounter: Secondary | ICD-10-CM | POA: Diagnosis not present

## 2017-09-19 DIAGNOSIS — Y9389 Activity, other specified: Secondary | ICD-10-CM | POA: Insufficient documentation

## 2017-09-19 DIAGNOSIS — I2510 Atherosclerotic heart disease of native coronary artery without angina pectoris: Secondary | ICD-10-CM | POA: Insufficient documentation

## 2017-09-19 DIAGNOSIS — Z79899 Other long term (current) drug therapy: Secondary | ICD-10-CM | POA: Diagnosis not present

## 2017-09-19 DIAGNOSIS — S0990XA Unspecified injury of head, initial encounter: Secondary | ICD-10-CM | POA: Insufficient documentation

## 2017-09-19 MED ORDER — HYDROCODONE-ACETAMINOPHEN 5-325 MG PO TABS: 1.0000 | ORAL_TABLET | ORAL | 0 refills | Status: DC | PRN

## 2017-09-19 MED ORDER — ONDANSETRON 4 MG PO TBDP
8.0000 mg | ORAL_TABLET | Freq: Once | ORAL | Status: AC
  Administered 2017-09-19: 8 mg via ORAL
  Filled 2017-09-19: qty 2

## 2017-09-19 MED ORDER — HYDROMORPHONE HCL 1 MG/ML IJ SOLN
2.0000 mg | Freq: Once | INTRAMUSCULAR | Status: AC
  Administered 2017-09-19: 2 mg via INTRAMUSCULAR
  Filled 2017-09-19: qty 2

## 2017-09-19 NOTE — ED Notes (Signed)
Patient verbalizes understanding of discharge instructions. Opportunity for questioning and answers were provided. Armband removed by staff, pt discharged from ED ambulatory.   

## 2017-09-19 NOTE — Discharge Instructions (Signed)
Get plenty of rest and drink a lot of fluids.  Use ice on the sore areas for 2 days, after that use heat.  Use Tylenol or Motrin as needed for pain, or the narcotic pain reliever if needed.  Do not drive or work when taking the narcotic pain reliever.

## 2017-09-19 NOTE — ED Provider Notes (Signed)
[facility] EMERGENCY DEPARTMENT Provider Note   CSN: [id number] Arrival date & time: 09/19/17  1452     History   Chief Complaint Chief Complaint  Patient presents with  . Fall    HPI Alec [name] is a 57 y.o. male.  He presents for injuries from fall.  Today, climbing down a ladder, on the last rung when he fell backwards striking his head, and "getting knocked out."  He complains of pain in the back of his head and his jaw.  He has not ambulated yet.  He was transferred by EMS with a cervical collar in place.  He denies paresthesias, weakness, nausea, vomiting, chest pain, shortness of breath, abdominal pain or new lower back pain.  He does have chronic low back pain.  There are no other known modifying factors.  HPI  Past Medical History:  Diagnosis Date  . Arthritis   . CAD (coronary artery disease)    a. 04/27/14 DES to dRCA; widely patent L coronary system b. cath 08/13/2015 no changes, patent RCA  . Chronic lower back pain   . Depression   . Gout   . History of echocardiogram    a. 04/27/14 with EF 65-70% and mild TR.  Marland Kitchen History of pancreatitis   . Metabolic syndrome   . Mixed hyperlipidemia   . Obesity (BMI 30-39.9) 02/16/2016  . OSA (obstructive sleep apnea) 11/26/2015   Severe with total AHI 104.8/hr and oxygen desaturations as low as 79%.  On CPAP at 13cm H2O  . Type 2 diabetes mellitus (HCC)    on Insulin pump    Patient Active Problem List   Diagnosis Date Noted  . Epistaxis 06/24/2016  . On anticoagulant therapy 06/24/2016  . Obesity (BMI 30-39.9) 02/16/2016  . OSA (obstructive sleep apnea) 11/26/2015  . Controlled type 2 diabetes mellitus ([place])   . Acute chest pain 08/12/2015  . Hypertriglyceridemia 04/28/2014  . CAD (coronary artery disease)   . Metabolic syndrome   . History of echocardiogram   . Depression   . Gout   . Unstable angina ([place]) 04/26/2014  . Type 2 diabetes mellitus ([place]) 04/26/2014  . Mixed hyperlipidemia  04/26/2014    Past Surgical History:  Procedure Laterality Date  . CARDIAC CATHETERIZATION N/A 08/14/2015   Procedure: Left Heart Cath and Coronary Angiography;  Surgeon: Belva Crome, MD;  Location: [facility];  Service: Cardiovascular;  Laterality: N/A;  . CARPAL TUNNEL RELEASE Bilateral 2006-2008   left 12/2004; right 10/2006  . CHONDROPLASTY  04/20/2015   Procedure: CHONDROPLASTY;  Surgeon: Melrose Nakayama, MD;  Location: [facility];  Service: Orthopedics;;  . FUNCTIONAL ENDOSCOPIC SINUS SURGERY  10/2001; 08/2005;   . KNEE ARTHROSCOPY Bilateral 2000's   right 08/2003;   . KNEE ARTHROSCOPY WITH MEDIAL MENISECTOMY Left 04/20/2015   Procedure: LEFT ARTHROSCOPY KNEE WITH MEDIAL MENISECTOMY;  Surgeon: Melrose Nakayama, MD;  Location: [facility];  Service: Orthopedics;  Laterality: Left;  Partial medial menisectomy, chondroplasty.  Marland Kitchen LEFT HEART CATHETERIZATION WITH CORONARY ANGIOGRAM N/A 04/27/2014   Procedure: LEFT HEART CATHETERIZATION WITH CORONARY ANGIOGRAM;  Surgeon: [name], MD;  Location: [facility] CATH LAB;  Service: Cardiovascular;  Laterality: N/A;  . LUMBAR LAMINECTOMY Left 05/2006  . PERCUTANEOUS STENT INTERVENTION  04/27/2014   Procedure: PERCUTANEOUS STENT INTERVENTION;  Surgeon: [name], MD;  Location: [facility] CATH LAB;  Service: Cardiovascular;;  DES x2 (distal +prox RCA)  . POSTERIOR LUMBAR FUSION  07/2009  .  SHOULDER ARTHROSCOPY Right ~ 2010       Home Medications    Prior to Admission medications   Medication Sig Start Date End Date Taking? Authorizing Provider  Alirocumab (PRALUENT [place]) Inject into the skin as directed.    [provider]  allopurinol (ZYLOPRIM) 300 MG tablet Take 1 tablet (300 mg total) by mouth daily. 04/28/14   Eileen Stanford, PA-C  aspirin EC 81 MG tablet Take 81 mg by mouth daily.    [provider]  atorvastatin (LIPITOR) 80 MG tablet Take 80 mg by mouth at bedtime. 07/28/15   [provider]  cetirizine (ZYRTEC) 10 MG tablet Take 10 mg by mouth daily.    [provider]  Empagliflozin (JARDIANCE) 25 MG TABS Take 25 mg by mouth daily.    [provider]  escitalopram (LEXAPRO) 20 MG tablet TAKE 1 TABLET (20 MG) BY MOUTH DAILY. 06/23/15   Belva Crome, MD  fenofibrate 160 MG tablet TAKE 1 TABLET (160 MG TOTAL) BY MOUTH DAILY. 03/13/17   Almyra Deforest, PA  HYDROcodone-acetaminophen (NORCO) 5-325 MG tablet Take 1-2 tablets by mouth every 4 (four) hours as needed for moderate pain. 09/19/17   Daleen Bo, MD  insulin regular human CONCENTRATED (HUMULIN R) 500 UNIT/ML SOLN injection Inject 500 Units into the skin 4 (four) times daily. 1.25 units per hour, bolus as needed blood suger    [provider]  Lysine 500 MG CAPS Take 500 mg by mouth daily.    [provider]  metoprolol succinate (TOPROL-XL) 25 MG 24 hr tablet Take 25 mg by mouth daily. 07/28/15   [provider]  montelukast (SINGULAIR) 10 MG tablet Take 10 mg by mouth at bedtime. 07/28/15   [provider]  naproxen sodium (ANAPROX) 220 MG tablet Take 440 mg by mouth daily as needed (pain).    [provider]  NITROSTAT 0.4 MG SL tablet PLACE 1 TABLET (0.4 MG TOTAL) UNDER THE TONGUE EVERY 5 (FIVE) MINUTES X 3 DOSES AS NEEDED FOR CHEST PAIN. DIAL 911 AFTER 3 DOSES 08/16/15   Eileen Stanford, PA-C  ONE TOUCH ULTRA TEST test strip 1 each by Other route as needed for other (blood sugar).  08/16/15   [provider]  ramipril (ALTACE) 10 MG capsule Take 10 mg by mouth daily.  07/28/15   [provider]  VASCEPA 1 G CAPS Take 2 g by mouth 2 (two) times daily.  03/03/14   [provider]    Family History Family History  Problem Relation Age of Onset  . Coronary artery disease Mother        MI in her 44's  . Heart attack Mother   . Diabetes Mother   . Prostate cancer Father   . Lung cancer Father   . Diabetes Father   . Colon  cancer Neg Hx     Social History Social History   Tobacco Use  . Smoking status: Never Smoker  . Smokeless tobacco: Never Used  Substance Use Topics  . Alcohol use: Yes    Comment: occ  . Drug use: No     Allergies   Percocet [oxycodone-acetaminophen]   Review of Systems Review of Systems  All other systems reviewed and are negative.    Physical Exam Updated Vital Signs BP 121/75 (BP Location: Right Arm)   Pulse 84   Resp 18   Ht 5\' 11"  (1.803 m)   Wt 119.7 kg (264 lb)  SpO2 93%   BMI 36.82 kg/m   Physical Exam  Constitutional: He is oriented to person, place, and time. He appears well-developed and well-nourished.  HENT:  Head: Normocephalic.  Right Ear: External ear normal.  Left Ear: External ear normal.  Tender occiput without swelling, deformity or bleeding.  Eyes: Conjunctivae and EOM are normal. Pupils are equal, round, and reactive to light.  Neck: Normal range of motion and phonation normal. Neck supple.  Wearing a hard collar.  Collar removed temporarily for examination, no midline step-off or tenderness of the posterior cervical spine.  Collar replaced.  Cardiovascular: Normal rate, regular rhythm and normal heart sounds.  Pulmonary/Chest: Effort normal and breath sounds normal. He exhibits no bony tenderness.  Abdominal: Soft. There is no tenderness.  Musculoskeletal: Normal range of motion.  Neurological: He is alert and oriented to person, place, and time. No cranial nerve deficit or sensory deficit. He exhibits normal muscle tone. Coordination normal.  No dysarthria, aphasia or nystagmus.  Skin: Skin is warm, dry and intact.  Psychiatric: He has a normal mood and affect. His behavior is normal. Judgment and thought content normal.  Nursing note and vitals reviewed.    ED Treatments / Results  Labs (all labs ordered are listed, but only abnormal results are displayed) Labs Reviewed - No data to display  EKG  EKG Interpretation None        Radiology Ct Head Wo Contrast  Result Date: 09/19/2017 CLINICAL DATA:  57 y/o M; fall from 20 foot ladder with injury to back of head. Head and neck pain. EXAM: CT HEAD WITHOUT CONTRAST CT MAXILLOFACIAL WITHOUT CONTRAST CT CERVICAL SPINE WITHOUT CONTRAST TECHNIQUE: Multidetector CT imaging of the head, cervical spine, and maxillofacial structures were performed using the standard protocol without intravenous contrast. Multiplanar CT image reconstructions of the cervical spine and maxillofacial structures were also generated. COMPARISON:  10/21/2011 CT head. FINDINGS: CT HEAD FINDINGS Brain: No evidence of acute infarction, hemorrhage, hydrocephalus, extra-axial collection or mass lesion/mass effect. Vascular: No hyperdense vessel or unexpected calcification. Skull: Right parietal region scalp contusion and small hematoma. No calvarial fracture. Other: None. CT MAXILLOFACIAL FINDINGS Osseous: No fracture or mandibular dislocation. No destructive process. Orbits: Negative. No traumatic or inflammatory finding. Sinuses: Left maxillary sinus mucous retention cyst. Left maxillary antrostomy postsurgical changes. Normal aeration of mastoid air cells. Soft tissues: Negative. CT CERVICAL SPINE FINDINGS Alignment: Minimal grade 1 C2-3 anterolisthesis. Straightening of cervical lordosis. Skull base and vertebrae: No acute fracture. No primary bone lesion or focal pathologic process. Soft tissues and spinal canal: No prevertebral fluid or swelling. No visible canal hematoma. Disc levels: Right-greater-than-left C2-3 facet hypertrophy. Disc space heights are well preserved. Upper chest: Negative. Other: Negative. IMPRESSION: 1. Right parietal region scalp contusion.  No calvarial fracture. 2. No acute intracranial abnormality identified. 3. No acute facial fracture or mandibular dislocation. 4. No acute fracture or dislocation of cervical spine. 5. Minimal grade 1 C2-3 anterolisthesis likely due to facet arthropathy.  Electronically Signed   By: Kristine Garbe M.D.   On: 09/19/2017 16:57   Ct Cervical Spine Wo Contrast  Result Date: 09/19/2017 CLINICAL DATA:  58 y/o M; fall from 20 foot ladder with injury to back of head. Head and neck pain. EXAM: CT HEAD WITHOUT CONTRAST CT MAXILLOFACIAL WITHOUT CONTRAST CT CERVICAL SPINE WITHOUT CONTRAST TECHNIQUE: Multidetector CT imaging of the head, cervical spine, and maxillofacial structures were performed using the standard protocol without intravenous contrast. Multiplanar CT image reconstructions of the cervical spine and  maxillofacial structures were also generated. COMPARISON:  10/21/2011 CT head. FINDINGS: CT HEAD FINDINGS Brain: No evidence of acute infarction, hemorrhage, hydrocephalus, extra-axial collection or mass lesion/mass effect. Vascular: No hyperdense vessel or unexpected calcification. Skull: Right parietal region scalp contusion and small hematoma. No calvarial fracture. Other: None. CT MAXILLOFACIAL FINDINGS Osseous: No fracture or mandibular dislocation. No destructive process. Orbits: Negative. No traumatic or inflammatory finding. Sinuses: Left maxillary sinus mucous retention cyst. Left maxillary antrostomy postsurgical changes. Normal aeration of mastoid air cells. Soft tissues: Negative. CT CERVICAL SPINE FINDINGS Alignment: Minimal grade 1 C2-3 anterolisthesis. Straightening of cervical lordosis. Skull base and vertebrae: No acute fracture. No primary bone lesion or focal pathologic process. Soft tissues and spinal canal: No prevertebral fluid or swelling. No visible canal hematoma. Disc levels: Right-greater-than-left C2-3 facet hypertrophy. Disc space heights are well preserved. Upper chest: Negative. Other: Negative. IMPRESSION: 1. Right parietal region scalp contusion.  No calvarial fracture. 2. No acute intracranial abnormality identified. 3. No acute facial fracture or mandibular dislocation. 4. No acute fracture or dislocation of cervical  spine. 5. Minimal grade 1 C2-3 anterolisthesis likely due to facet arthropathy. Electronically Signed   By: Kristine Garbe M.D.   On: 09/19/2017 16:57   Ct Maxillofacial Wo Cm  Result Date: 09/19/2017 CLINICAL DATA:  57 y/o M; fall from 20 foot ladder with injury to back of head. Head and neck pain. EXAM: CT HEAD WITHOUT CONTRAST CT MAXILLOFACIAL WITHOUT CONTRAST CT CERVICAL SPINE WITHOUT CONTRAST TECHNIQUE: Multidetector CT imaging of the head, cervical spine, and maxillofacial structures were performed using the standard protocol without intravenous contrast. Multiplanar CT image reconstructions of the cervical spine and maxillofacial structures were also generated. COMPARISON:  10/21/2011 CT head. FINDINGS: CT HEAD FINDINGS Brain: No evidence of acute infarction, hemorrhage, hydrocephalus, extra-axial collection or mass lesion/mass effect. Vascular: No hyperdense vessel or unexpected calcification. Skull: Right parietal region scalp contusion and small hematoma. No calvarial fracture. Other: None. CT MAXILLOFACIAL FINDINGS Osseous: No fracture or mandibular dislocation. No destructive process. Orbits: Negative. No traumatic or inflammatory finding. Sinuses: Left maxillary sinus mucous retention cyst. Left maxillary antrostomy postsurgical changes. Normal aeration of mastoid air cells. Soft tissues: Negative. CT CERVICAL SPINE FINDINGS Alignment: Minimal grade 1 C2-3 anterolisthesis. Straightening of cervical lordosis. Skull base and vertebrae: No acute fracture. No primary bone lesion or focal pathologic process. Soft tissues and spinal canal: No prevertebral fluid or swelling. No visible canal hematoma. Disc levels: Right-greater-than-left C2-3 facet hypertrophy. Disc space heights are well preserved. Upper chest: Negative. Other: Negative. IMPRESSION: 1. Right parietal region scalp contusion.  No calvarial fracture. 2. No acute intracranial abnormality identified. 3. No acute facial fracture or  mandibular dislocation. 4. No acute fracture or dislocation of cervical spine. 5. Minimal grade 1 C2-3 anterolisthesis likely due to facet arthropathy. Electronically Signed   By: Kristine Garbe M.D.   On: 09/19/2017 16:57    Procedures Procedures (including critical care time)  Medications Ordered in ED Medications  HYDROmorphone (DILAUDID) injection 2 mg (2 mg Intramuscular Given 09/19/17 1600)  ondansetron (ZOFRAN-ODT) disintegrating tablet 8 mg (8 mg Oral Given 09/19/17 1600)     Initial Impression / Assessment and Plan / ED Course  I have reviewed the triage vital signs and the nursing notes.  Pertinent labs & imaging results that were available during my care of the patient were reviewed by me and considered in my medical decision making (see chart for details).     Patient Vitals for the past 24 hrs:  BP Pulse Resp SpO2 Height Weight  09/19/17 1737 121/75 84 18 93 % - -  09/19/17 1515 - - - - 5\' 11"  (1.803 m) 119.7 kg (264 lb)  09/19/17 1500 (!) 141/75 89 18 100 % - -    5:59 PM Reevaluation with update and discussion. After initial assessment and treatment, an updated evaluation reveals patient remains alert.  Cervical collar removed and he is able to move his neck well and ambulate to the bathroom easily.  Lungs auscultated and are clear.  Posterior ribs palpated and are nontender.  Findings discussed and questions answered. Daleen Bo      Final Clinical Impressions(s) / ED Diagnoses   Final diagnoses:  Fall, initial encounter  Injury of head, initial encounter  Facial injury, initial encounter   Fall with contusions and head injury.  Reported loss of consciousness.  Possible concussion.  Stable for discharge with outpatient management.  Plan on return to work in 3 days.  Nursing Notes Reviewed/ Care Coordinated Applicable Imaging Reviewed Interpretation of Laboratory Data incorporated into ED treatment  The patient appears reasonably screened and/or  stabilized for discharge and I doubt any other medical condition or other [facility] requiring further screening, evaluation, or treatment in the ED at this time prior to discharge.  Plan: Home Medications-continue current home medications; Home Treatments-rest, ice therapy advance to heat; return here if the recommended treatment, does not improve the symptoms; Recommended follow up-PCP or occupational physician as needed   ED Discharge Orders        Ordered    HYDROcodone-acetaminophen (NORCO) 5-325 MG tablet  Every 4 hours PRN     09/19/17 1757       Daleen Bo, MD 09/19/17 1801

## 2017-09-19 NOTE — ED Triage Notes (Addendum)
Per EMS, pt from work, was coming down a 6 foot ladder and on the next to bottom step he accidentally slipped and fell and hit the back of his head and has a hematoma and got knocked out. Pt also complains of pain in the jaw, neck and lower legs. Pt has hx of back problems. Neuro intact. Axox4. 145/100, hx of htn, HR 92, RR 16, spo2 94% on RA. 12 lead NSR. C-collar in place.

## 2017-10-09 MED FILL — HYDROCODON-APAP 10-325: 10-325 | 30 days supply | Qty: 120 | Fill #0

## 2017-10-16 MED FILL — ATORVASTATIN 80 MG TABLET: 80 | 30 days supply | Qty: 30 | Fill #11

## 2017-10-16 MED FILL — VASCEPA 1 GM CAPSULE: 1 | 30 days supply | Qty: 120 | Fill #4

## 2017-10-16 MED FILL — ALLOPURINOL 300 MG TABS: 300 | 30 days supply | Qty: 30 | Fill #4

## 2017-10-16 MED FILL — ESCITALOPRAM 20 MG TABLET: 20 | 30 days supply | Qty: 30 | Fill #5

## 2017-10-16 MED FILL — HUMULIN R 500 UNITS/ML VIAL: 500 | 30 days supply | Qty: 20 | Fill #2

## 2017-10-16 MED FILL — FENOFIBRATE 160 MG TABLET: 160 | 30 days supply | Qty: 30 | Fill #6

## 2017-10-16 MED FILL — METOPROLOL SUCCINATE ER 25: 25 | 30 days supply | Qty: 30 | Fill #3

## 2017-10-29 MED FILL — CONTOUR NEXT STRIPS: 30 days supply | Qty: 300 | Fill #0

## 2017-10-31 MED FILL — JARDIANCE 25 MG TABLET: 25 | 30 days supply | Qty: 30 | Fill #3

## 2017-11-10 MED FILL — HYDROCODON-APAP 10-325: 10-325 | 30 days supply | Qty: 120 | Fill #0

## 2017-11-27 MED FILL — valACYclovir HCL 1 GM TABS: 1 | 8 days supply | Qty: 30 | Fill #1

## 2017-12-05 MED FILL — CONTOUR NEXT STRIPS: 30 days supply | Qty: 300 | Fill #1

## 2017-12-05 MED FILL — HUMULIN R 500 UNITS/ML VIAL: 500 | 30 days supply | Qty: 20 | Fill #3

## 2017-12-05 MED FILL — JARDIANCE 25 MG TABLET: 25 | 30 days supply | Qty: 30 | Fill #4

## 2017-12-05 MED FILL — LYRICA 50 MG CAPSULE: 50 | 30 days supply | Qty: 60 | Fill #0

## 2017-12-05 MED FILL — ESCITALOPRAM 20 MG TABLET: 20 | 30 days supply | Qty: 30 | Fill #0

## 2017-12-05 MED FILL — ALLOPURINOL 300 MG TABS: 300 | 30 days supply | Qty: 30 | Fill #5

## 2017-12-05 MED FILL — ATORVASTATIN 80 MG TABLET: 80 | 90 days supply | Qty: 90 | Fill #0

## 2017-12-05 MED FILL — FENOFIBRATE 160 MG TABLET: 160 | 30 days supply | Qty: 30 | Fill #7

## 2017-12-05 MED FILL — VASCEPA 1 GM CAPSULE: 1 | 30 days supply | Qty: 120 | Fill #5

## 2017-12-05 MED FILL — HYDROCODON-APAP 10-325: 10-325 | 30 days supply | Qty: 120 | Fill #0

## 2017-12-05 MED FILL — METOPROLOL SUCCINATE ER 25: 25 | 30 days supply | Qty: 30 | Fill #4

## 2018-01-05 MED FILL — LYRICA 50 MG CAPSULE: 50 | 30 days supply | Qty: 60 | Fill #1

## 2018-01-05 MED FILL — JARDIANCE 25 MG TABLET: 25 | 30 days supply | Qty: 30 | Fill #5

## 2018-01-05 MED FILL — HYDROCODON-APAP 10-325: 10-325 | 30 days supply | Qty: 120 | Fill #0

## 2018-01-05 MED FILL — ALLOPURINOL 300 MG TABS: 300 | 30 days supply | Qty: 30 | Fill #6

## 2018-01-05 MED FILL — HUMULIN R 500 UNITS/ML VIAL: 500 | 30 days supply | Qty: 20 | Fill #4

## 2018-01-05 MED FILL — ESCITALOPRAM 20 MG TABLET: 20 | 30 days supply | Qty: 30 | Fill #1

## 2018-01-05 MED FILL — FENOFIBRATE 160 MG TABLET: 160 | 30 days supply | Qty: 30 | Fill #8

## 2018-01-05 MED FILL — METOPROLOL SUCCINATE ER 25: 25 | 30 days supply | Qty: 30 | Fill #5

## 2018-01-05 MED FILL — VASCEPA 1 GM CAPSULE: 1 | 30 days supply | Qty: 120 | Fill #6

## 2018-01-27 ENCOUNTER — Other Ambulatory Visit: Payer: Self-pay | Admitting: Physician Assistant

## 2018-01-27 MED FILL — CEFDINIR 300 MG CAPS: 300 | 10 days supply | Qty: 20 | Fill #0

## 2018-02-05 MED FILL — JARDIANCE 25 MG TABLET: 25 | 30 days supply | Qty: 30 | Fill #6

## 2018-02-05 MED FILL — ALLOPURINOL 300 MG TABS: 300 | 30 days supply | Qty: 30 | Fill #0

## 2018-02-05 MED FILL — ESCITALOPRAM 20 MG TABLET: 20 | 30 days supply | Qty: 30 | Fill #2

## 2018-02-05 MED FILL — HYDROCODON-APAP 10-325: 10-325 | 30 days supply | Qty: 120 | Fill #0

## 2018-02-05 MED FILL — LYRICA 50 MG CAPSULE: 50 | 30 days supply | Qty: 60 | Fill #0

## 2018-02-05 MED FILL — CONTOUR NEXT STRIPS: 30 days supply | Qty: 300 | Fill #2

## 2018-02-05 MED FILL — FENOFIBRATE 160 MG TABLET: 160 | 90 days supply | Qty: 90 | Fill #0

## 2018-02-05 MED FILL — METOPROLOL SUCCINATE ER 25: 25 | 30 days supply | Qty: 30 | Fill #0

## 2018-02-05 MED FILL — VASCEPA 1 GM CAPSULE: 1 | 30 days supply | Qty: 120 | Fill #0

## 2018-02-05 MED FILL — HUMULIN R 500 UNITS/ML VIAL: 500 | 30 days supply | Qty: 20 | Fill #5

## 2018-03-10 MED FILL — HYDROCODON-APAP 10-325: 10-325 | 30 days supply | Qty: 120 | Fill #0

## 2018-04-03 MED FILL — CONTOUR NEXT STRIPS: 30 days supply | Qty: 300 | Fill #3

## 2018-04-03 MED FILL — VASCEPA 1 GM CAPSULE: 1 | 30 days supply | Qty: 120 | Fill #1

## 2018-04-03 MED FILL — ESCITALOPRAM 20 MG TABLET: 20 | 30 days supply | Qty: 30 | Fill #3

## 2018-04-03 MED FILL — METOPROLOL SUCCINATE ER 25: 25 | 30 days supply | Qty: 30 | Fill #1

## 2018-04-03 MED FILL — ALLOPURINOL 300 MG TABLET: 300 | 30 days supply | Qty: 30 | Fill #1

## 2018-04-03 MED FILL — JARDIANCE 25 MG TABLET: 25 | 30 days supply | Qty: 30 | Fill #0

## 2018-04-03 MED FILL — RAMIPRIL 10 MG CAPSULE: 10 | 30 days supply | Qty: 30 | Fill #1

## 2018-04-03 MED FILL — HUMULIN R 500 UNITS/ML VIAL: 500 | 30 days supply | Qty: 20 | Fill #6

## 2018-04-03 MED FILL — LYRICA 50 MG CAPSULE: 50 | 30 days supply | Qty: 60 | Fill #1

## 2018-04-10 MED FILL — HYDROCODON-APAP 10-325: 10-325 | 30 days supply | Qty: 120 | Fill #0

## 2018-05-12 MED FILL — RAMIPRIL 10 MG CAPSULE: 10 | 30 days supply | Qty: 30 | Fill #2

## 2018-05-12 MED FILL — METOPROLOL SUCCINATE ER 25: 25 | 30 days supply | Qty: 30 | Fill #2

## 2018-05-12 MED FILL — JARDIANCE 25 MG TABLET: 25 | 30 days supply | Qty: 30 | Fill #1

## 2018-05-12 MED FILL — ALLOPURINOL 300 MG TABLET: 300 | 30 days supply | Qty: 30 | Fill #2

## 2018-05-12 MED FILL — HUMULIN R 500 UNITS/ML VIAL: 500 | 30 days supply | Qty: 20 | Fill #7

## 2018-05-12 MED FILL — VASCEPA 1 GM CAPSULE: 1 | 30 days supply | Qty: 120 | Fill #2

## 2018-05-12 MED FILL — ESCITALOPRAM 20 MG TABLET: 20 | 30 days supply | Qty: 30 | Fill #4

## 2018-05-12 MED FILL — LYRICA 50 MG CAPSULE: 50 | 30 days supply | Qty: 60 | Fill #2

## 2018-05-13 MED FILL — FENOFIBRATE 160 MG TABS: 160 | 90 days supply | Qty: 90 | Fill #1

## 2018-05-13 MED FILL — HYDROCODON-APAP 10-325: 10-325 | 30 days supply | Qty: 120 | Fill #0

## 2018-05-28 MED FILL — ATORVASTATIN 80 MG TABLET: 80 | 90 days supply | Qty: 90 | Fill #1

## 2018-06-24 MED FILL — JARDIANCE 25 MG TABLET: 25 | 30 days supply | Qty: 30 | Fill #2

## 2018-06-24 MED FILL — PREGABALIN 50 MG CAPS: 50 | 30 days supply | Qty: 60 | Fill #3

## 2018-06-24 MED FILL — VASCEPA 1 GM CAPSULE: 1 | 30 days supply | Qty: 120 | Fill #3

## 2018-06-24 MED FILL — RAMIPRIL 10 MG CAPSULE: 10 | 30 days supply | Qty: 30 | Fill #3

## 2018-06-24 MED FILL — HUMULIN R 500 UNITS/ML VIAL: 500 | 30 days supply | Qty: 20 | Fill #8

## 2018-06-24 MED FILL — ESCITALOPRAM 20 MG TABLET: 20 | 30 days supply | Qty: 30 | Fill #5

## 2018-06-24 MED FILL — ALLOPURINOL 300 MG TABLET: 300 | 30 days supply | Qty: 30 | Fill #3

## 2018-06-24 MED FILL — METOPROLOL SUCCINATE ER 25: 25 | 30 days supply | Qty: 30 | Fill #3

## 2018-07-09 MED FILL — valACYclovir HCL 1 GM TABS: 1 | 8 days supply | Qty: 30 | Fill #0

## 2018-07-10 MED FILL — HYDROCODON-APAP 10-325: 10-325 | 30 days supply | Qty: 120 | Fill #0

## 2018-08-12 ENCOUNTER — Telehealth: Payer: Self-pay | Admitting: Interventional Cardiology

## 2018-08-12 MED FILL — HYDROCODON-APAP 10-325: 10-325 | 30 days supply | Qty: 120 | Fill #0

## 2018-08-12 MED FILL — JARDIANCE 25 MG TABLET: 25 | 30 days supply | Qty: 30 | Fill #3

## 2018-08-12 MED FILL — HUMULIN R 500 UNITS/ML VIAL: 500 | 90 days supply | Qty: 20 | Fill #0

## 2018-08-12 NOTE — Telephone Encounter (Signed)
Spoke with Alec [name] and scheduled him to see Jory Sims, DNP on 12/2.  Advised to go to ER if any changes.  Alec [name] verbalized understanding and was in agreement with this plan.

## 2018-08-12 NOTE — Telephone Encounter (Signed)
Spoke with pt and he denies SOB, CP, lightheadedness, dizziness or blurred vision.  States he has tingling on the left side of his neck that goes into his neck and shoulder.  He has had 3 of these episodes in the last 2 weeks.  They last about 5-10 minutes.  He checked his BP while on the phone and it was 141/91, HR 86.  States BP is usually normal and feels likely elevated from being anxious about current symptoms.  States nothing seems to bring these episodes on, they just suddenly occur.  Advised pt I would speak to DOD and call him back.  Spoke with Dr. Angelena Form and he said to schedule pt to come in early next week for eval, if any changes over the weekend go to ER.    Attempted to call pt back and left message.

## 2018-08-12 NOTE — Telephone Encounter (Signed)
New Message          Patient is calling today to report tingling in his left side of his neck and ear and radiating to shoulder. It has happened twice today already.

## 2018-08-16 NOTE — Progress Notes (Signed)
Cardiology Office Note   Date:  08/17/2018   ID:  Alec [name], DOB 1961/03/15, MRN [id number]  PCP:  Alec Bowen, MD  Cardiologist:  Dr.Smith/Dr. Radford Pax (OSA)  Chief Complaint  Patient presents with  . Numbness    neck and ear   . Chest Pain  . Coronary Artery Disease     History of Present Illness: Alec [name] is a 57 y.o. male who presents for ongoing assessment and management of CAD with hx of PCI to the RCA in 2015. Last cath in 2016 revealed widely patent stents. Other history includes morbid obesity, mixed HL and Type II diabetes,  He was last seen by Dr. Tamala [name] on 12/31/2016 and was doing well. He called our office on 08/12/2018 with complaints of tingling on the left side of his neck and ear, radiating to the left shoulder. He is here for further evaluation.   He has been seen by Dr. Radford Pax on 02/17/2017 for management of OSA.e was not always compliant with CPAP as he often fell asleep in his recliner and sleeps there all night off the CPAP. He admits to non-compliance with CPAP.  He states that he has had left jaw,neck and shoulder tingling and pain with exertion. This is dissimilar to pain he experienced when he had MI. He reports diaphoresis, with the new symptoms. He felt substernal chest pain while riding his his car, while his wife was driving. He took two NTG with resolution of pain. This was a few months ago.   Past Medical History:  Diagnosis Date  . Arthritis   . CAD (coronary artery disease)    a. 04/27/14 DES to dRCA; widely patent L coronary system b. cath 08/13/2015 no changes, patent RCA  . Chronic lower back pain   . Depression   . Gout   . History of echocardiogram    a. 04/27/14 with EF 65-70% and mild TR.  Marland Kitchen History of pancreatitis   . Metabolic syndrome   . Mixed hyperlipidemia   . Obesity (BMI 30-39.9) 02/16/2016  . OSA (obstructive sleep apnea) 11/26/2015   Severe with total AHI 104.8/hr and oxygen desaturations as low as 79%.  On CPAP at 13cm H2O  .  Type 2 diabetes mellitus (HCC)    on Insulin pump    Past Surgical History:  Procedure Laterality Date  . CARDIAC CATHETERIZATION N/A 08/14/2015   Procedure: Left Heart Cath and Coronary Angiography;  Surgeon: Belva Crome, MD;  Location: [place] [facility];  Service: Cardiovascular;  Laterality: N/A;  . CARPAL TUNNEL RELEASE Bilateral 2006-2008   left 12/2004; right 10/2006  . CHONDROPLASTY  04/20/2015   Procedure: CHONDROPLASTY;  Surgeon: Melrose Nakayama, MD;  Location: [place];  Service: Orthopedics;;  . FUNCTIONAL ENDOSCOPIC SINUS SURGERY  10/2001; 08/2005;   . KNEE ARTHROSCOPY Bilateral 2000's   right 08/2003;   . KNEE ARTHROSCOPY WITH MEDIAL MENISECTOMY Left 04/20/2015   Procedure: LEFT ARTHROSCOPY KNEE WITH MEDIAL MENISECTOMY;  Surgeon: Melrose Nakayama, MD;  Location: [place];  Service: Orthopedics;  Laterality: Left;  Partial medial menisectomy, chondroplasty.  Marland Kitchen LEFT HEART CATHETERIZATION WITH CORONARY ANGIOGRAM N/A 04/27/2014   Procedure: LEFT HEART CATHETERIZATION WITH CORONARY ANGIOGRAM;  Surgeon: [name], MD;  Location: [facility] CATH [facility];  Service: Cardiovascular;  Laterality: N/A;  . LUMBAR LAMINECTOMY Left 05/2006  . PERCUTANEOUS STENT INTERVENTION  04/27/2014   Procedure: PERCUTANEOUS STENT INTERVENTION;  Surgeon: [name], MD;  Location: [facility] CATH LAB;  Service: Cardiovascular;;  DES x2 (distal +prox RCA)  . POSTERIOR LUMBAR FUSION  07/2009  . SHOULDER ARTHROSCOPY Right ~ 2010     Current Outpatient Medications  Medication Sig Dispense Refill  . Alirocumab (PRALUENT [place]) Inject into the skin as directed.    Marland Kitchen allopurinol (ZYLOPRIM) 300 MG tablet Take 1 tablet (300 mg total) by mouth daily. 30 tablet 11  . aspirin EC 81 MG tablet Take 81 mg by mouth daily.    Marland Kitchen atorvastatin (LIPITOR) 80 MG tablet Take 80 mg by mouth at bedtime.  8  . cetirizine (ZYRTEC) 10 MG tablet Take 10 mg by mouth daily.    . Empagliflozin (JARDIANCE) 25 MG  TABS Take 25 mg by mouth daily.    Marland Kitchen escitalopram (LEXAPRO) 20 MG tablet TAKE 1 TABLET (20 MG) BY MOUTH DAILY. 30 tablet 0  . fenofibrate 160 MG tablet TAKE 1 TABLET (160 MG TOTAL) BY MOUTH DAILY. 90 tablet 1  . HYDROcodone-acetaminophen (NORCO) 5-325 MG tablet Take 1-2 tablets by mouth every 4 (four) hours as needed for moderate pain. 10 tablet 0  . insulin regular human CONCENTRATED (HUMULIN R) 500 UNIT/ML SOLN injection Inject 500 Units into the skin 4 (four) times daily. 1.25 units per hour, bolus as needed blood suger    . metoprolol succinate (TOPROL-XL) 25 MG 24 hr tablet Take 25 mg by mouth daily.  7  . montelukast (SINGULAIR) 10 MG tablet Take 10 mg by mouth at bedtime.  5  . naproxen sodium (ANAPROX) 220 MG tablet Take 440 mg by mouth daily as needed (pain).    Marland Kitchen NITROSTAT 0.4 MG SL tablet PLACE 1 TABLET (0.4 MG TOTAL) UNDER THE TONGUE EVERY 5 (FIVE) MINUTES X 3 DOSES AS NEEDED FOR CHEST PAIN. DIAL 911 AFTER 3 DOSES 25 tablet 1  . ONE TOUCH ULTRA TEST test strip 1 each by Other route as needed for other (blood sugar).   6  . ramipril (ALTACE) 10 MG capsule Take 10 mg by mouth daily.   6  . VASCEPA 1 G CAPS Take 2 g by mouth 2 (two) times daily.      No current facility-administered medications for this visit.     Allergies:   Percocet [oxycodone-acetaminophen]    Social History:  The patient  reports that he has never smoked. He has never used smokeless tobacco. He reports that he drinks alcohol. He reports that he does not use drugs.   Family History:  The patient's family history includes Coronary artery disease in his mother; Diabetes in his father and mother; Heart attack in his mother; Lung cancer in his father; Prostate cancer in his father.    ROS: All other systems are reviewed and negative. Unless otherwise mentioned in H&P    PHYSICAL EXAM: VS:  BP 120/68   Pulse 72   Ht 5\' 11"  (1.803 m)   Wt 279 lb 3.2 oz (126.6 kg)   BMI 38.94 kg/m  , BMI Body mass index is  38.94 kg/m. GEN: Well nourished, well developed, in no acute distress, obes HEENT: normal Neck: no JVD, carotid bruits, or masses Cardiac: RRR; no murmurs, rubs, or gallops,no edema  Respiratory:  Clear to auscultation bilaterally, normal work of breathing GI: soft, nontender, nondistended, + BS MS: no deformity or atrophy Skin: warm and dry, no rash, multiple tattoos. Wearing an insulin pump. Neuro:  Strength and sensation are intact Psych: euthymic mood, full affect   EKG:  NSR rate of 72 bpm  Recent Labs: No results found for requested labs within last 8760 hours.    Lipid Panel    Component Value Date/Time   CHOL 349 (H) 10/13/2015 0858   TRIG 2,598 (H) 10/13/2015 0858   HDL 27 (L) 10/13/2015 0858   CHOLHDL 12.9 (H) 10/13/2015 0858   VLDL NOT CALC 10/13/2015 0858   LDLCALC NOT CALC 10/13/2015 0858   LDLDIRECT 65 10/13/2015 0858      Wt Readings from Last 3 Encounters:  08/17/18 279 lb 3.2 oz (126.6 kg)  09/19/17 264 lb (119.7 kg)  02/17/17 264 lb (119.7 kg)      Other studies Reviewed: Echocardiogram Sep 04, 2015 Left ventricle: The cavity size was normal. Wall thickness was   increased in a pattern of mild LVH. Systolic function was normal.   The estimated ejection fraction was in the range of 60% to 65%.   Wall motion was normal; there were no regional wall motion   abnormalities. Doppler parameters are consistent with abnormal   left ventricular relaxation (grade 1 diastolic dysfunction). - Aortic valve: Mildly calcified annulus. Trileaflet; mildly   thickened leaflets. Valve area (VTI): 3.78 cm^2. Valve area   (Vmax): 3.61 cm^2. - Technically difficult study. Echocontrast was used to enhance   visualization.  Cardiac Cath 08/14/2015 Conclusion    Mild luminal irregularities are noted in the right coronary, and circumflex.  Widely patent proximal and distal RCA stents placed in August 2015.  Normal LV function with elevated end-diastolic  pressure.  Prolonged chest discomfort, greater than 24 hours without EKG changes or enzyme elevation suggests alternative explanation than ischemia/infarction.     ASSESSMENT AND PLAN:  1.Aypical Angina Symptoms:  Jaw discomfort with tingling in his left jaw, left neck and radiation down his left arm. Dissimilar to pain associated with CAD in 2015, with RCA stenosis. Due to continued CVRF of diabetes, obesity, and HL I will go ahead and do a NM stress test for evaluation of progression of CAD. I have asked him to hold the metoprolol the day before the stress test and the day of the stress test to allow HR to increase.   2. Hypercholesterolemia: I will check fasting lipids and LFT's for levels. He will continue atorvastatin 80. Goal of LDL < 70.   3. Hypertension: BP is well controlled. No changes in his regimen.   Current medicines are reviewed at length with the patient today.    Labs/ tests ordered today include: NM Stress test. Fasting lipids and LFT's   Phill Myron. West Pugh, ANP, AACC   08/17/2018 11:58 AM    [place] [address] Office [phone] Fax [phone]

## 2018-08-17 ENCOUNTER — Encounter: Payer: Self-pay | Admitting: Adult Health

## 2018-08-17 ENCOUNTER — Ambulatory Visit: Payer: PRIVATE HEALTH INSURANCE | Admitting: Adult Health

## 2018-08-17 VITALS — BP 120/68 | HR 72 | Ht 71.0 in | Wt 279.2 lb

## 2018-08-17 DIAGNOSIS — Z79899 Other long term (current) drug therapy: Secondary | ICD-10-CM

## 2018-08-17 DIAGNOSIS — E7800 Pure hypercholesterolemia, unspecified: Secondary | ICD-10-CM

## 2018-08-17 DIAGNOSIS — I25118 Atherosclerotic heart disease of native coronary artery with other forms of angina pectoris: Secondary | ICD-10-CM | POA: Diagnosis not present

## 2018-08-17 DIAGNOSIS — R079 Chest pain, unspecified: Secondary | ICD-10-CM | POA: Diagnosis not present

## 2018-08-17 NOTE — Patient Instructions (Signed)
Medication Instructions:  NO CHANGES- Your physician recommends that you continue on your current medications as directed. Please refer to the Current Medication list given to you today.  If you need a refill on your cardiac medications before your next appointment, please call your pharmacy.  Labwork: LFT AND LIPID DAY OF STRESS TEST HERE IN OUR OFFICE AT [facility]  Take the provided lab slips with you to the lab for your blood draw.   You will need to fast. DO NOT EAT OR DRINK PAST MIDNIGHT.   If you have labs (blood work) drawn today and your tests are completely normal, you will receive your results only by: Marland Kitchen MyChart Message (if you have MyChart) OR . A paper copy in the mail If you have any lab test that is abnormal or we need to change your treatment, we will call you to review the results.  Testing/Procedures: Your physician has requested that you have an Exercise Myoview. A cardiac stress test is a cardiological test that measures the heart's ability to respond to external stress in a controlled clinical environment. The stress response is induced by exercise (exercise-treadmill). For further information please visit [email or web address][facility]tn. If you have questions or concerns about your appointment, you can call the Nuclear Lab at [phone].  Hold: METOPROLOL THE DAY BEFORE THE TEST HOLD THE MORNING OF the testing    Follow-Up: You will need a follow up appointment in 1 MONTH.  You may see  DR Antionette Char, DNP, AACC or one of the following Advanced Practice Providers on your designated Care Team. At [facility], you and your health needs are our priority.  As part of our continuing mission to provide you with exceptional heart care, we have created designated Provider Care Teams.  These Care Teams include your primary Cardiologist (physician) and Advanced Practice Providers (APPs -  Physician Assistants and Nurse Practitioners) who all work together to provide you with the  care you need, when you need it.  Thank you for choosing [facility] at [facility]!!

## 2018-08-21 ENCOUNTER — Telehealth (HOSPITAL_COMMUNITY): Payer: Self-pay

## 2018-08-21 NOTE — Telephone Encounter (Signed)
Encounter complete. 

## 2018-08-24 ENCOUNTER — Ambulatory Visit: Payer: Self-pay | Admitting: Cardiology

## 2018-08-26 ENCOUNTER — Other Ambulatory Visit: Payer: Self-pay | Admitting: Cardiology

## 2018-08-26 ENCOUNTER — Ambulatory Visit (HOSPITAL_COMMUNITY): Payer: Self-pay

## 2018-08-26 MED FILL — ATORVASTATIN 80 MG TABLET: 80 | 90 days supply | Qty: 90 | Fill #2

## 2018-08-26 MED FILL — FENOFIBRATE 160 MG TABLET: 160 | 90 days supply | Qty: 90 | Fill #0

## 2018-08-26 MED FILL — ALLOPURINOL 300 MG TABLET: 300 | 30 days supply | Qty: 30 | Fill #4

## 2018-08-26 MED FILL — valACYclovir HCL 1 GM TABS: 1 | 8 days supply | Qty: 30 | Fill #1

## 2018-08-26 MED FILL — NITROGLYCERIN 0.4 MG TAB SL: 0.4 | 7 days supply | Qty: 25 | Fill #0

## 2018-08-26 MED FILL — HUMULIN R 500 UNITS/ML VIAL: 500 | 23 days supply | Qty: 20 | Fill #0

## 2018-08-26 MED FILL — RAMIPRIL 10 MG CAPSULE: 10 | 30 days supply | Qty: 30 | Fill #4

## 2018-08-26 MED FILL — PREGABALIN 50 MG CAPS: 50 | 30 days supply | Qty: 60 | Fill #0

## 2018-08-26 MED FILL — METOPROLOL SUCCINATE ER 25: 25 | 30 days supply | Qty: 30 | Fill #4

## 2018-08-26 MED FILL — ESCITALOPRAM 20 MG TABLET: 20 | 30 days supply | Qty: 30 | Fill #0

## 2018-08-26 MED FILL — VASCEPA 1 GM CAPSULE: 1 | 30 days supply | Qty: 120 | Fill #4

## 2018-08-26 MED FILL — CONTOUR NEXT STRIPS: 30 days supply | Qty: 300 | Fill #4

## 2018-08-27 ENCOUNTER — Inpatient Hospital Stay (HOSPITAL_COMMUNITY): Admission: RE | Admit: 2018-08-27 | Payer: Self-pay | Source: Ambulatory Visit

## 2018-09-01 ENCOUNTER — Telehealth (HOSPITAL_COMMUNITY): Payer: Self-pay

## 2018-09-01 NOTE — Telephone Encounter (Signed)
Encounter complete. 

## 2018-09-03 ENCOUNTER — Ambulatory Visit (HOSPITAL_COMMUNITY): Payer: Self-pay

## 2018-09-03 ENCOUNTER — Ambulatory Visit (HOSPITAL_COMMUNITY)
Admission: RE | Admit: 2018-09-03 | Discharge: 2018-09-03 | Disposition: A | Discharge status: Home / Self Care | Payer: PRIVATE HEALTH INSURANCE | Source: Ambulatory Visit | Attending: Cardiology | Admitting: Cardiology

## 2018-09-03 DIAGNOSIS — R079 Chest pain, unspecified: Secondary | ICD-10-CM

## 2018-09-03 DIAGNOSIS — I25118 Atherosclerotic heart disease of native coronary artery with other forms of angina pectoris: Secondary | ICD-10-CM | POA: Diagnosis present

## 2018-09-03 MED ORDER — TECHNETIUM TC 99M TETROFOSMIN IV KIT
31.8000 | PACK | Freq: Once | INTRAVENOUS | Status: AC | PRN
  Administered 2018-09-03: 31.8 via INTRAVENOUS
  Filled 2018-09-03: qty 32

## 2018-09-04 ENCOUNTER — Ambulatory Visit (HOSPITAL_COMMUNITY)
Admission: RE | Admit: 2018-09-04 | Discharge: 2018-09-04 | Disposition: A | Discharge status: Home / Self Care | Payer: PRIVATE HEALTH INSURANCE | Source: Ambulatory Visit | Attending: Internal Medicine | Admitting: Internal Medicine

## 2018-09-04 ENCOUNTER — Ambulatory Visit (HOSPITAL_COMMUNITY): Payer: Self-pay

## 2018-09-04 LAB — MYOCARDIAL PERFUSION IMAGING
Estimated workload: 7.7 METS
Exercise duration (min): 7 min
Exercise duration (sec): 40 s
LV dias vol: 92 mL (ref 62–150)
LV sys vol: 40 mL
MPHR: 164 {beats}/min
Peak HR: 151 {beats}/min
Percent HR: 92 %
RPE: 19
Rest HR: 73 {beats}/min
SDS: 2
SRS: 0
SSS: 2
TID: 0.72

## 2018-09-04 MED ORDER — TECHNETIUM TC 99M TETROFOSMIN IV KIT
27.4000 | PACK | Freq: Once | INTRAVENOUS | Status: AC | PRN
  Administered 2018-09-04: 27.4 via INTRAVENOUS

## 2018-09-17 MED FILL — HYDROCODON-APAP 10-325: 10-325 | 30 days supply | Qty: 120 | Fill #0

## 2018-09-17 MED FILL — JARDIANCE 25 MG TABLET: 25 | 30 days supply | Qty: 30 | Fill #4

## 2018-09-17 MED FILL — AZITHROMYCIN 250 MG TABLET: 250 | 5 days supply | Qty: 6 | Fill #0

## 2018-09-21 NOTE — Progress Notes (Signed)
Cardiology Office Note   Date:  09/22/2018   ID:  Lennox Dolberry [name], DOB 1961/02/05, MRN [id number]  PCP:  Alec Bowen, MD  Cardiologist:  Dr.Smith/Dr.Turner (OSA) Chief Complaint  Patient presents with  . Coronary Artery Disease     History of Present Illness: Alec [name] is a 58 y.o. male who presents for ongoing assessment and management of CAD with h/o PCI to the RCA in 2015. She had a repeat cath in 2016 which revealed widely patent stents. She has other history of morbid obesity, mixed HL, and Type Ii Diabetes.   He has been seen by Dr. Radford [name] on 02/17/2017 for management of OSA.e was not always compliant with CPAP as he often fell asleep in his recliner and sleeps there all night off the CPAP. He admits to non-compliance with CPAP.  On last office visit he was complaining of chest pain while riding in his car while his wife was driving. He reported diaphoresis. This was dissimilar to symptoms he had with MI. He was scheduled for NM stress test of evaluation. Stress test was normal. No evidence of ischemia or scar.   He comes today without any complaints. He is medically compliant. He requests appointment with Dr. Radford [name] for CPAP management and also with a dietician or nutritionist for help with weight loss.   Past Medical History:  Diagnosis Date  . Arthritis   . CAD (coronary artery disease)    a. 04/27/14 DES to dRCA; widely patent L coronary system b. cath 08/13/2015 no changes, patent RCA  . Chronic lower back pain   . Depression   . Gout   . History of echocardiogram    a. 04/27/14 with EF 65-70% and mild TR.  Alec [name] History of pancreatitis   . Metabolic syndrome   . Mixed hyperlipidemia   . Obesity (BMI 30-39.9) 02/16/2016  . OSA (obstructive sleep apnea) 11/26/2015   Severe with total AHI 104.8/hr and oxygen desaturations as low as 79%.  On CPAP at 13cm H2O  . Type 2 diabetes mellitus (HCC)    on Insulin pump    Past Surgical History:  Procedure Laterality Date  . CARDIAC  CATHETERIZATION N/A 08/14/2015   Procedure: Left Heart Cath and Coronary Angiography;  Surgeon: Alec Crome, MD;  Location: [place] [facility];  Service: Cardiovascular;  Laterality: N/A;  . CARPAL TUNNEL RELEASE Bilateral 2006-2008   left 12/2004; right 10/2006  . CHONDROPLASTY  04/20/2015   Procedure: CHONDROPLASTY;  Surgeon: Alec Nakayama, MD;  Location: [place];  Service: Orthopedics;;  . FUNCTIONAL ENDOSCOPIC SINUS SURGERY  10/2001; 08/2005;   . KNEE ARTHROSCOPY Bilateral 2000's   right 08/2003;   . KNEE ARTHROSCOPY WITH MEDIAL MENISECTOMY Left 04/20/2015   Procedure: LEFT ARTHROSCOPY KNEE WITH MEDIAL MENISECTOMY;  Surgeon: Alec Nakayama, MD;  Location: [place];  Service: Orthopedics;  Laterality: Left;  Partial medial menisectomy, chondroplasty.  Alec [name] LEFT HEART CATHETERIZATION WITH CORONARY ANGIOGRAM N/A 04/27/2014   Procedure: LEFT HEART CATHETERIZATION WITH CORONARY ANGIOGRAM;  Surgeon: Alec Grooms, MD;  Location: [facility] CATH LAB;  Service: Cardiovascular;  Laterality: N/A;  . LUMBAR LAMINECTOMY Left 05/2006  . PERCUTANEOUS STENT INTERVENTION  04/27/2014   Procedure: PERCUTANEOUS STENT INTERVENTION;  Surgeon: Alec Grooms, MD;  Location: [facility] CATH LAB;  Service: Cardiovascular;;  DES x2 (distal +prox RCA)  . POSTERIOR LUMBAR FUSION  07/2009  . SHOULDER ARTHROSCOPY Right ~ 2010     Current Outpatient Medications  Medication Sig  Dispense Refill  . Alirocumab (PRALUENT [place]) Inject into the skin as directed.    Alec [name] allopurinol (ZYLOPRIM) 300 MG tablet Take 1 tablet (300 mg total) by mouth daily. 30 tablet 11  . aspirin EC 81 MG tablet Take 81 mg by mouth daily.    Alec [name] atorvastatin (LIPITOR) 80 MG tablet Take 80 mg by mouth at bedtime.  8  . cetirizine (ZYRTEC) 10 MG tablet Take 10 mg by mouth daily.    . Empagliflozin (JARDIANCE) 25 MG TABS Take 25 mg by mouth daily.    Alec [name] escitalopram (LEXAPRO) 20 MG tablet TAKE 1 TABLET (20 MG) BY MOUTH DAILY. 30 tablet 0   . fenofibrate 160 MG tablet TAKE 1 TABLET (160 MG TOTAL) BY MOUTH DAILY. 90 tablet 3  . HYDROcodone-acetaminophen (NORCO) 5-325 MG tablet Take 1-2 tablets by mouth every 4 (four) hours as needed for moderate pain. 10 tablet 0  . insulin regular human CONCENTRATED (HUMULIN R) 500 UNIT/ML SOLN injection Inject 500 Units into the skin 4 (four) times daily. 1.25 units per hour, bolus as needed blood suger    . metoprolol succinate (TOPROL-XL) 25 MG 24 hr tablet Take 25 mg by mouth daily.  7  . montelukast (SINGULAIR) 10 MG tablet Take 10 mg by mouth at bedtime.  5  . naproxen sodium (ANAPROX) 220 MG tablet Take 440 mg by mouth daily as needed (pain).    Alec [name] NITROSTAT 0.4 MG SL tablet PLACE 1 TABLET (0.4 MG TOTAL) UNDER THE TONGUE EVERY 5 (FIVE) MINUTES X 3 DOSES AS NEEDED FOR CHEST PAIN. DIAL 911 AFTER 3 DOSES 25 tablet 1  . ONE TOUCH ULTRA TEST test strip 1 each by Other route as needed for other (blood sugar).   6  . ramipril (ALTACE) 10 MG capsule Take 10 mg by mouth daily.   6  . VASCEPA 1 G CAPS Take 2 g by mouth 2 (two) times daily.      No current facility-administered medications for this visit.     Allergies:   Percocet [oxycodone-acetaminophen]    Social History:  The patient  reports that he has never smoked. He has never used smokeless tobacco. He reports current alcohol use. He reports that he does not use drugs.   Family History:  The patient's family history includes Coronary artery disease in his mother; Diabetes in his father and mother; Heart attack in his mother; Lung cancer in his father; Prostate cancer in his father.    ROS: All other systems are reviewed and negative. Unless otherwise mentioned in H&P    PHYSICAL EXAM: VS:  BP (!) 152/84   Pulse 79   Ht 5\' 11"  (1.803 m)   Wt 278 lb 14.4 oz (126.5 kg)   BMI 38.90 kg/m  , BMI Body mass index is 38.9 kg/m. GEN: Well nourished, well developed, in no acute distress. Obese HEENT: normal Neck: no JVD, carotid bruits, or  masses Cardiac: RRR; no murmurs, rubs, or gallops,no edema  Respiratory:  Clear to auscultation bilaterally, normal work of breathing GI: soft, nontender, nondistended, + BS MS: no deformity or atrophy Skin: warm and dry, no rash Neuro:  Strength and sensation are intact Psych: euthymic mood, full affect   EKG: Not completed.   Recent Labs: No results found for requested labs within last 8760 hours.    Lipid Panel    Component Value Date/Time   CHOL 349 (H) 10/13/2015 0858   TRIG 2,598 (H) 10/13/2015 0858   HDL 27 (L)  10/13/2015 0858   CHOLHDL 12.9 (H) 10/13/2015 0858   VLDL NOT CALC 10/13/2015 0858   LDLCALC NOT CALC 10/13/2015 0858   LDLDIRECT 65 10/13/2015 0858      Wt Readings from Last 3 Encounters:  09/22/18 278 lb 14.4 oz (126.5 kg)  09/03/18 279 lb (126.6 kg)  08/17/18 279 lb 3.2 oz (126.6 kg)      Other studies Reviewed: Study Highlights    Nuclear stress EF: 56%.  Clinically and electrically negative for ischemia  Normal perfusion No ischemia or scar  Low risk study.     ASSESSMENT AND PLAN:  1. CAD:  He asked me to review his most recent cardiac cath report from 2016 to show him where his stents are placed. I have printed a copy and explained that he has two stents in his Proximal and Distal RCA, with mild stenosis  In the CX. He is to continue his current regimen with BB, statin and ACE inhibitor. He is no longer on antiplatelet therapy. Stress test results are discussed with reassurance given.   2. OSA:He has had trouble with CPAP compliance. He is made an appointment with Dr.Turner for ongoing recommendations.   3. Obesity: He is referred to dietician  4. Hypertension Elevated today but has not yet taken his medications this morning. He has them at work and will take them when he gets there.    Current medicines are reviewed at length with the patient today.    Labs/ tests ordered today include: None  Phill Myron. West Pugh, ANP, AACC     09/22/2018 8:42 AM    [facility] [place] [address] Office [phone] Fax [phone]

## 2018-09-22 ENCOUNTER — Encounter: Payer: Self-pay | Admitting: Adult Health

## 2018-09-22 ENCOUNTER — Telehealth: Payer: Self-pay

## 2018-09-22 ENCOUNTER — Ambulatory Visit: Payer: PRIVATE HEALTH INSURANCE | Admitting: Adult Health

## 2018-09-22 VITALS — BP 152/84 | HR 79 | Ht 71.0 in | Wt 278.9 lb

## 2018-09-22 DIAGNOSIS — G4733 Obstructive sleep apnea (adult) (pediatric): Secondary | ICD-10-CM | POA: Diagnosis not present

## 2018-09-22 DIAGNOSIS — E7800 Pure hypercholesterolemia, unspecified: Secondary | ICD-10-CM | POA: Diagnosis not present

## 2018-09-22 DIAGNOSIS — I10 Essential (primary) hypertension: Secondary | ICD-10-CM

## 2018-09-22 DIAGNOSIS — I2510 Atherosclerotic heart disease of native coronary artery without angina pectoris: Secondary | ICD-10-CM

## 2018-09-22 NOTE — Patient Instructions (Signed)
Follow-Up: You will need a follow up appointment in 6 months.  Please call our office 2 months (MAY 2020)   in advance to schedule (AUGUST 2020) this appointment.  You may see [name], MD  Jory Sims, DNP, AACC  or one of the following Advanced Practice Providers on your designated Care Team:  Truitt Merle, NP  Cecilie Kicks, NP  Kathyrn Drown, NP   You will need a follow up appointment 1st AVAILABLE WITH DR Linward Foster APNEA Medication Instructions:  NO CHANGES- Your physician recommends that you continue on your current medications as directed. Please refer to the Current Medication list given to you today. If you need a refill on your cardiac medications before your next appointment, please call your pharmacy.  Labwork: When you have labs (blood work) and your tests are completely normal, you will receive your results ONLY by [place] (if you have MyChart) -OR- A paper copy in the mail.  At [facility], you and your health needs are our priority.  As part of our continuing mission to provide you with exceptional heart care, we have created designated Provider Care Teams.  These Care Teams include your primary Cardiologist (physician) and Advanced Practice Providers (APPs -  Physician Assistants and Nurse Practitioners) who all work together to provide you with the care you need, when you need it.  Thank you for choosing [facility] at [facility]!!

## 2018-09-22 NOTE — Telephone Encounter (Signed)
Followed up on referral for diet management. Scheduled for Mon 1/13 at 4:30 (4:15 in Epic)

## 2018-09-28 ENCOUNTER — Ambulatory Visit (INDEPENDENT_AMBULATORY_CARE_PROVIDER_SITE_OTHER): Payer: PRIVATE HEALTH INSURANCE

## 2018-09-28 DIAGNOSIS — Z0000 Encounter for general adult medical examination without abnormal findings: Secondary | ICD-10-CM | POA: Diagnosis not present

## 2018-09-29 NOTE — Progress Notes (Signed)
Week: 1  Progress Notes: Pt discussed need for eating better. Says he has trouble with eating out, portion sizes, and sodium intake. Tries not to salt his food after preparation, but admits sometimes he will. Does notice a fluctuation with his blood sugar throughout the day. Has decided to eat a slight snack between meals. Went over sodium content and how to incorporate low sodium options. Also provided information on mediterranean diet.   Challenges: Pt has trouble with his back that makes physical activity a challenge.   Opportunities: Pt mentioned that he loves to cook. Will cook at least 4x a week. 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   Client Commitment/Agreement for Next Session: First goal is to reduce portion size by not going for second serving. Follow up call on 1/20

## 2018-10-02 MED FILL — ALLOPURINOL 300 MG TABLET: 300 | 30 days supply | Qty: 30 | Fill #5

## 2018-10-02 MED FILL — METOPROLOL SUCCINATE ER 25: 25 | 30 days supply | Qty: 30 | Fill #5

## 2018-10-02 MED FILL — PREGABALIN 50 MG CAPS: 50 | 30 days supply | Qty: 60 | Fill #1

## 2018-10-02 MED FILL — ESCITALOPRAM 20 MG TABLET: 20 | 30 days supply | Qty: 30 | Fill #1

## 2018-10-02 MED FILL — RAMIPRIL 10 MG CAPSULE: 10 | 90 days supply | Qty: 90 | Fill #0

## 2018-10-02 MED FILL — VASCEPA 1 GM CAPSULE: 1 | 30 days supply | Qty: 120 | Fill #5

## 2018-10-02 MED FILL — valACYclovir HCL 1 GM TABS: 1 | 8 days supply | Qty: 30 | Fill #2

## 2018-10-09 ENCOUNTER — Ambulatory Visit: Payer: Self-pay | Admitting: Cardiology

## 2018-10-16 MED FILL — HUMULIN R 500 UNITS/ML VIAL: 500 | 23 days supply | Qty: 20 | Fill #1

## 2018-10-16 MED FILL — HYDROCODON-APAP 10-325: 10-325 | 30 days supply | Qty: 120 | Fill #0

## 2018-10-16 MED FILL — JARDIANCE 25 MG TABLET: 25 | 30 days supply | Qty: 30 | Fill #5

## 2018-11-01 NOTE — Progress Notes (Signed)
Cardiology Office Note:    Date:  11/02/2018   ID:  Mendy Lapinsky Achenbach, DOB February 21, 1961, MRN [id number]  PCP:  Reynold Bowen, MD  Cardiologist:  [name], MD    Referring MD: Reynold Bowen, MD   Chief Complaint  Patient presents with  . Sleep Apnea  . Hypertension    History of Present Illness:    MAHONRI SEIDEN is a 58 y.o. male with a hx of severe OSA with an AHI of 104/hr and oxygen desaturations as low as 79%. The patient had severe snoring. He is on CPAP therapy at 13cm H2O. He is doing well with his CPAP device and thinks that he has gotten used to it.  He tolerates the mask and feels the pressure is adequate.  Since going on CPAP he feels rested in the am and has no significant daytime sleepiness.  He has been having problems with severe mouth dryness and recently found out that he was using a mouthwash with alcohol in it.  He also had not used his PAP for 2 months because he left it up [place] when he went to his brothers funeral and his sister then had to return it.  He is using nasal saline spray.   Past Medical History:  Diagnosis Date  . Arthritis   . CAD (coronary artery disease)    a. 04/27/14 DES to dRCA; widely patent L coronary system b. cath 08/13/2015 no changes, patent RCA  . Chronic lower back pain   . Depression   . Gout   . History of echocardiogram    a. 04/27/14 with EF 65-70% and mild TR.  Marland Kitchen History of pancreatitis   . Metabolic syndrome   . Mixed hyperlipidemia   . Obesity (BMI 30-39.9) 02/16/2016  . OSA (obstructive sleep apnea) 11/26/2015   Severe with total AHI 104.8/hr and oxygen desaturations as low as 79%.  On CPAP at 13cm H2O  . Type 2 diabetes mellitus (HCC)    on Insulin pump    Past Surgical History:  Procedure Laterality Date  . CARDIAC CATHETERIZATION N/A 08/14/2015   Procedure: Left Heart Cath and Coronary Angiography;  Surgeon: Belva Crome, MD;  Location: [facility];  Service: Cardiovascular;  Laterality: N/A;  . CARPAL  TUNNEL RELEASE Bilateral 2006-2008   left 12/2004; right 10/2006  . CHONDROPLASTY  04/20/2015   Procedure: CHONDROPLASTY;  Surgeon: Melrose Nakayama, MD;  Location: [facility];  Service: Orthopedics;;  . FUNCTIONAL ENDOSCOPIC SINUS SURGERY  10/2001; 08/2005;   . KNEE ARTHROSCOPY Bilateral 2000's   right 08/2003;   . KNEE ARTHROSCOPY WITH MEDIAL MENISECTOMY Left 04/20/2015   Procedure: LEFT ARTHROSCOPY KNEE WITH MEDIAL MENISECTOMY;  Surgeon: Melrose Nakayama, MD;  Location: [facility] [facility];  Service: Orthopedics;  Laterality: Left;  Partial medial menisectomy, chondroplasty.  Marland Kitchen LEFT HEART CATHETERIZATION WITH CORONARY ANGIOGRAM N/A 04/27/2014   Procedure: LEFT HEART CATHETERIZATION WITH CORONARY ANGIOGRAM;  Surgeon: [name], MD;  Location: [facility];  Service: Cardiovascular;  Laterality: N/A;  . LUMBAR LAMINECTOMY Left 05/2006  . PERCUTANEOUS STENT INTERVENTION  04/27/2014   Procedure: PERCUTANEOUS STENT INTERVENTION;  Surgeon: [name], MD;  Location: [facility] CATH [facility];  Service: Cardiovascular;;  DES x2 (distal +prox RCA)  . POSTERIOR LUMBAR FUSION  07/2009  . SHOULDER ARTHROSCOPY Right ~ 2010    Current Medications: Current Meds  Medication Sig  . Alirocumab (PRALUENT [place]) Inject into the skin as directed.  Marland Kitchen  allopurinol (ZYLOPRIM) 300 MG tablet Take 1 tablet (300 mg total) by mouth daily.  Marland Kitchen aspirin EC 81 MG tablet Take 81 mg by mouth daily.  Marland Kitchen atorvastatin (LIPITOR) 80 MG tablet Take 80 mg by mouth at bedtime.  . cetirizine (ZYRTEC) 10 MG tablet Take 10 mg by mouth daily.  . Empagliflozin (JARDIANCE) 25 MG TABS Take 25 mg by mouth daily.  Marland Kitchen escitalopram (LEXAPRO) 20 MG tablet TAKE 1 TABLET (20 MG) BY MOUTH DAILY.  . fenofibrate 160 MG tablet TAKE 1 TABLET (160 MG TOTAL) BY MOUTH DAILY.  Marland Kitchen HYDROcodone-acetaminophen (NORCO) 5-325 MG tablet Take 1-2 tablets by mouth every 4 (four) hours as needed for moderate pain.  Marland Kitchen insulin regular human CONCENTRATED  (HUMULIN R) 500 UNIT/ML SOLN injection Inject 500 Units into the skin 4 (four) times daily. 1.95 units per hour, bolus as needed blood suger  . metoprolol succinate (TOPROL-XL) 25 MG 24 hr tablet Take 25 mg by mouth daily.  . montelukast (SINGULAIR) 10 MG tablet Take 10 mg by mouth at bedtime.  . naproxen sodium (ANAPROX) 220 MG tablet Take 440 mg by mouth daily as needed (pain).  Marland Kitchen NITROSTAT 0.4 MG SL tablet PLACE 1 TABLET (0.4 MG TOTAL) UNDER THE TONGUE EVERY 5 (FIVE) MINUTES X 3 DOSES AS NEEDED FOR CHEST PAIN. DIAL 911 AFTER 3 DOSES  . ONE TOUCH ULTRA TEST test strip 1 each by Other route as needed for other (blood sugar).   . pregabalin (LYRICA) 50 MG capsule Take 50 mg by mouth 2 (two) times daily.  . ramipril (ALTACE) 10 MG capsule Take 10 mg by mouth daily.   . valACYclovir (VALTREX) 1000 MG tablet   . VASCEPA 1 G CAPS Take 2 g by mouth 2 (two) times daily.      Allergies:   Percocet [oxycodone-acetaminophen]   Social History   Socioeconomic History  . Marital status: Married    Spouse name: Not on file  . Number of children: 2  . Years of education: Not on file  . Highest education level: Not on file  Occupational History  . Occupation: Actuary  Social Needs  . Financial resource strain: Not on file  . Food insecurity:    Worry: Not on file    Inability: Not on file  . Transportation needs:    Medical: Not on file    Non-medical: Not on file  Tobacco Use  . Smoking status: Never Smoker  . Smokeless tobacco: Never Used  Substance and Sexual Activity  . Alcohol use: Yes    Comment: occ  . Drug use: No  . Sexual activity: Not on file  Lifestyle  . Physical activity:    Days per week: Not on file    Minutes per session: Not on file  . Stress: Not on file  Relationships  . Social connections:    Talks on phone: Not on file    Gets together: Not on file    Attends religious service: Not on file    Active member of club or organization: Not on file     Attends meetings of clubs or organizations: Not on file    Relationship status: Not on file  Other Topics Concern  . Not on file  Social History Narrative  . Not on file     Family History: The patient's family history includes Coronary artery disease in his mother; Diabetes in his father and mother; Heart attack in his mother; Lung cancer in his father; Prostate  cancer in his father. There is no history of Colon cancer.  ROS:   Please see the history of present illness.    ROS  All other systems reviewed and negative.   EKGs/Labs/Other Studies Reviewed:    The following studies were reviewed today: PAP download  EKG:  EKG is not ordered today  Recent Labs: No results found for requested labs within last 8760 hours.   Recent Lipid Panel    Component Value Date/Time   CHOL 349 (H) 10/13/2015 0858   TRIG 2,598 (H) 10/13/2015 0858   HDL 27 (L) 10/13/2015 0858   CHOLHDL 12.9 (H) 10/13/2015 0858   VLDL NOT CALC 10/13/2015 0858   LDLCALC NOT CALC 10/13/2015 0858   LDLDIRECT 65 10/13/2015 0858    Physical Exam:    VS:  BP 120/76   Pulse 75   Ht 5\' 11"  (1.803 m)   Wt 279 lb (126.6 kg)   SpO2 91%   BMI 38.91 kg/m     Wt Readings from Last 3 Encounters:  11/02/18 279 lb (126.6 kg)  09/22/18 278 lb 14.4 oz (126.5 kg)  09/03/18 279 lb (126.6 kg)     GEN:  Well nourished, well developed in no acute distress HEENT: Normal NECK: No JVD; No carotid bruits LYMPHATICS: No lymphadenopathy CARDIAC: RRR, no murmurs, rubs, gallops RESPIRATORY:  Clear to auscultation without rales, wheezing or rhonchi  ABDOMEN: Soft, non-tender, non-distended MUSCULOSKELETAL:  No edema; No deformity  SKIN: Warm and dry NEUROLOGIC:  Alert and oriented x 3 PSYCHIATRIC:  Normal affect   ASSESSMENT:    1. OSA (obstructive sleep apnea)   2. Obesity (BMI 30-39.9)    PLAN:    In order of problems listed above:  1.  OSA - the patient is tolerating PAP therapy well without any problems. The  PAP download was reviewed today and showed an AHI of 0.6/hr on 13 cm H2O with 17% compliance in using more than 4 hours nightly.  The patient has been using and benefiting from PAP use and will continue to benefit from therapy. I have encouraged him to use nasal saline spray twice daily, try to adjust his humidity dial on his device and add a chin strap.  I will see him back in 6 weeks to make sure he is doing ok.  2.  Obesity - I have encouraged him to get into a routine exercise program and cut back on carbs and portions.    Medication Adjustments/Labs and Tests Ordered: Current medicines are reviewed at length with the patient today.  Concerns regarding medicines are outlined above.  No orders of the defined types were placed in this encounter.  No orders of the defined types were placed in this encounter.   Signed, Fransico Him, MD  11/02/2018 3:23 PM    [place]

## 2018-11-02 ENCOUNTER — Encounter: Payer: Self-pay | Admitting: Cardiology

## 2018-11-02 ENCOUNTER — Telehealth: Payer: Self-pay | Admitting: *Deleted

## 2018-11-02 ENCOUNTER — Ambulatory Visit: Payer: PRIVATE HEALTH INSURANCE | Admitting: Cardiology

## 2018-11-02 VITALS — BP 120/76 | HR 75 | Ht 71.0 in | Wt 279.0 lb

## 2018-11-02 DIAGNOSIS — E669 Obesity, unspecified: Secondary | ICD-10-CM | POA: Diagnosis not present

## 2018-11-02 DIAGNOSIS — G4733 Obstructive sleep apnea (adult) (pediatric): Secondary | ICD-10-CM | POA: Diagnosis not present

## 2018-11-02 NOTE — Telephone Encounter (Signed)
-----   Message from Sarina Ill, RN sent at 11/02/2018  3:36 PM EST ----- Regarding: Sleep Hello, Dr. Radford Pax ordered a new chin strap and adjustment to humidity dial on CPAP.  Thanks, [employer]

## 2018-11-02 NOTE — Telephone Encounter (Signed)
Order placed to [facility] today. 

## 2018-11-02 NOTE — Patient Instructions (Addendum)
Medication Instructions:  Your physician recommends that you continue on your current medications as directed. Please refer to the Current Medication list given to you today.  If you need a refill on your cardiac medications before your next appointment, please call your pharmacy.   Lab work: None If you have labs (blood work) drawn today and your tests are completely normal, you will receive your results only by: Marland Kitchen MyChart Message (if you have MyChart) OR . A paper copy in the mail If you have any lab test that is abnormal or we need to change your treatment, we will call you to review the results.  Testing/Procedures: None  Follow-Up: At [facility], you and your health needs are our priority.  As part of our continuing mission to provide you with exceptional heart care, we have created designated Provider Care Teams.  These Care Teams include your primary Cardiologist (physician) and Advanced Practice Providers (APPs -  Physician Assistants and Nurse Practitioners) who all work together to provide you with the care you need, when you need it.  Your physician recommends that you schedule a follow-up appointment in: 6 weeks with Dr. Mallie Snooks will need a follow up appointment in 1 years.  Please call our office 2 months in advance to schedule this appointment.  You may see Dr. Radford Pax  or one of the following Advanced Practice Providers on your designated Care Team:   [place], PA-C Melina Copa, PA-C . Ermalinda Barrios, PA-C  Nasal Saline spray, two times a day

## 2018-11-09 MED FILL — HUMULIN R 500 UNITS/ML VIAL: 500 | 23 days supply | Qty: 20 | Fill #2

## 2018-11-19 MED FILL — HYDROCODON-APAP 10-325: 10-325 | 30 days supply | Qty: 120 | Fill #0

## 2018-11-20 MED FILL — ESCITALOPRAM 20 MG TABLET: 20 | 30 days supply | Qty: 30 | Fill #2

## 2018-11-20 MED FILL — JARDIANCE 25 MG TABLET: 25 | 30 days supply | Qty: 30 | Fill #6

## 2018-11-20 MED FILL — ALLOPURINOL 300 MG TABLET: 300 | 30 days supply | Qty: 30 | Fill #6

## 2018-12-04 ENCOUNTER — Encounter: Payer: Self-pay | Admitting: Cardiology

## 2018-12-04 MED FILL — METOPROLOL SUCCINATE ER 25: 25 | 30 days supply | Qty: 30 | Fill #0

## 2018-12-04 MED FILL — VASCEPA 1 GM CAPSULE: 1 | 30 days supply | Qty: 120 | Fill #6

## 2018-12-04 MED FILL — HUMULIN R 500 UNITS/ML VIAL: 500 | 23 days supply | Qty: 20 | Fill #3

## 2018-12-15 ENCOUNTER — Ambulatory Visit: Payer: PRIVATE HEALTH INSURANCE | Admitting: Cardiology

## 2018-12-18 MED FILL — ESCITALOPRAM 20 MG TABLET: 20 | 30 days supply | Qty: 30 | Fill #3

## 2018-12-18 MED FILL — ALLOPURINOL 300 MG TABLET: 300 | 30 days supply | Qty: 30 | Fill #0

## 2018-12-18 MED FILL — JARDIANCE 25 MG TABLET: 25 | 30 days supply | Qty: 30 | Fill #0

## 2018-12-19 MED FILL — HYDROCODON-APAP 10-325: 10-325 | 30 days supply | Qty: 120 | Fill #0

## 2018-12-21 MED FILL — MONTELUKAST SOD 10 MG TAB: 10 | 30 days supply | Qty: 30 | Fill #0

## 2018-12-24 MED FILL — HUMULIN R 500 UNITS/ML VIAL: 500 | 23 days supply | Qty: 20 | Fill #4

## 2019-01-13 MED FILL — FENOFIBRATE 160 MG TABLET: 160 | 90 days supply | Qty: 90 | Fill #1

## 2019-01-13 MED FILL — ATORVASTATIN 80 MG TABLET: 80 | 90 days supply | Qty: 90 | Fill #0

## 2019-01-13 MED FILL — HUMULIN R 500 UNITS/ML VIAL: 500 | 23 days supply | Qty: 20 | Fill #5

## 2019-01-13 MED FILL — VASCEPA 1 GM CAPSULE: 1 | 30 days supply | Qty: 120 | Fill #0

## 2019-01-13 MED FILL — METOPROLOL SUCCINATE ER 25: 25 | 30 days supply | Qty: 30 | Fill #1

## 2019-01-13 MED FILL — RAMIPRIL 10 MG CAPSULE: 10 | 90 days supply | Qty: 90 | Fill #1

## 2019-01-19 MED FILL — HYDROCODON-APAP 10-325: 10-325 | 30 days supply | Qty: 120 | Fill #0

## 2019-01-19 MED FILL — ESCITALOPRAM 20 MG TABLET: 20 | 30 days supply | Qty: 30 | Fill #4

## 2019-01-19 MED FILL — JARDIANCE 25 MG TABLET: 25 | 30 days supply | Qty: 30 | Fill #1

## 2019-01-22 MED FILL — ALLOPURINOL 300 MG TABLET: 300 | 30 days supply | Qty: 30 | Fill #1

## 2019-02-18 MED FILL — HYDROCODON-APAP 10-325: 10-325 | 30 days supply | Qty: 120 | Fill #0

## 2019-02-22 ENCOUNTER — Telehealth (HOSPITAL_COMMUNITY): Payer: Self-pay | Admitting: *Deleted

## 2019-02-22 NOTE — Telephone Encounter (Signed)
Attempting to reach patient to schedule appt

## 2019-02-26 ENCOUNTER — Other Ambulatory Visit (HOSPITAL_COMMUNITY): Payer: Self-pay | Admitting: Endocrinology

## 2019-02-26 ENCOUNTER — Ambulatory Visit (HOSPITAL_COMMUNITY)
Admission: RE | Admit: 2019-02-26 | Discharge: 2019-02-26 | Disposition: A | Discharge status: Home / Self Care | Payer: PRIVATE HEALTH INSURANCE | Source: Ambulatory Visit | Attending: Family | Admitting: Family

## 2019-02-26 ENCOUNTER — Other Ambulatory Visit: Payer: Self-pay

## 2019-02-26 DIAGNOSIS — I69998 Other sequelae following unspecified cerebrovascular disease: Secondary | ICD-10-CM | POA: Diagnosis present

## 2019-02-26 DIAGNOSIS — R209 Unspecified disturbances of skin sensation: Secondary | ICD-10-CM

## 2019-03-11 MED FILL — HUMULIN R 500 UNITS/ML VIAL: 500 | 23 days supply | Qty: 20 | Fill #0

## 2019-03-18 MED FILL — JARDIANCE 25 MG TABLET: 25 | 30 days supply | Qty: 30 | Fill #2

## 2019-03-18 MED FILL — METOPROLOL SUCCINATE ER 25: 25 | 30 days supply | Qty: 30 | Fill #2

## 2019-03-18 MED FILL — ESCITALOPRAM 20 MG TABLET: 20 | 30 days supply | Qty: 30 | Fill #5

## 2019-03-18 MED FILL — ALLOPURINOL 300 MG TABLET: 300 | 30 days supply | Qty: 30 | Fill #2

## 2019-03-19 MED FILL — HYDROCODON-APAP 10-325: 10-325 | 30 days supply | Qty: 120 | Fill #0

## 2019-03-19 MED FILL — valACYclovir HCL 1 GM TABS: 1 | 8 days supply | Qty: 30 | Fill #0

## 2019-03-19 MED FILL — PREGABALIN 50 MG CAPS: 50 | 30 days supply | Qty: 60 | Fill #0

## 2019-04-05 MED FILL — HUMULIN R 500 UNITS/ML VIAL: 500 | 23 days supply | Qty: 20 | Fill #1

## 2019-04-15 ENCOUNTER — Other Ambulatory Visit: Payer: Self-pay | Admitting: Physician Assistant

## 2019-04-15 DIAGNOSIS — M542 Cervicalgia: Secondary | ICD-10-CM

## 2019-04-15 MED FILL — FENOFIBRATE 160 MG TABLET: 160 | 90 days supply | Qty: 90 | Fill #2

## 2019-04-15 MED FILL — ALLOPURINOL 300 MG TABLET: 300 | 30 days supply | Qty: 30 | Fill #3

## 2019-04-15 MED FILL — ATORVASTATIN 80 MG TABLET: 80 | 90 days supply | Qty: 90 | Fill #1

## 2019-04-15 MED FILL — JARDIANCE 25 MG TABLET: 25 | 30 days supply | Qty: 30 | Fill #3

## 2019-04-15 MED FILL — ESCITALOPRAM 20 MG TABLET: 20 | 90 days supply | Qty: 90 | Fill #0

## 2019-04-15 MED FILL — RAMIPRIL 10 MG CAPSULE: 10 | 90 days supply | Qty: 90 | Fill #0

## 2019-04-15 MED FILL — METOPROLOL SUCCINATE ER 25: 25 | 30 days supply | Qty: 30 | Fill #3

## 2019-04-19 MED FILL — HYDROCODON-APAP 10-325: 10-325 | 30 days supply | Qty: 120 | Fill #0

## 2019-04-19 MED FILL — VASCEPA 1 GM CAPSULE: 1 | 30 days supply | Qty: 120 | Fill #1

## 2019-04-19 MED FILL — PREGABALIN 50 MG CAPS: 50 | 30 days supply | Qty: 60 | Fill #1

## 2019-05-06 MED FILL — HUMULIN R 500 UNITS/ML VIAL: 500 | 23 days supply | Qty: 20 | Fill #2

## 2019-05-19 MED FILL — HYDROCODON-APAP 10-325: 10-325 | 30 days supply | Qty: 120 | Fill #0

## 2019-05-20 MED FILL — CONTOUR NEXT STRIPS: 30 days supply | Qty: 300 | Fill #0

## 2019-06-09 MED FILL — ALLOPURINOL 300 MG TABLET: 300 | 30 days supply | Qty: 30 | Fill #4

## 2019-06-09 MED FILL — HUMULIN R 500 UNITS/ML VIAL: 500 | 23 days supply | Qty: 20 | Fill #3

## 2019-06-09 MED FILL — VASCEPA 1 GM CAPSULE: 1 | 30 days supply | Qty: 120 | Fill #2

## 2019-06-09 MED FILL — METOPROLOL SUCCINATE ER 25: 25 | 30 days supply | Qty: 30 | Fill #4

## 2019-06-18 MED FILL — JARDIANCE 25 MG TABLET: 25 | 30 days supply | Qty: 30 | Fill #4

## 2019-06-18 MED FILL — HYDROCODON-APAP 10-325: 10-325 | 30 days supply | Qty: 120 | Fill #0

## 2019-07-09 MED FILL — ALLOPURINOL 300 MG TABLET: 300 | 30 days supply | Qty: 30 | Fill #5

## 2019-07-09 MED FILL — VASCEPA 1 GM CAPSULE: 1 | 30 days supply | Qty: 120 | Fill #3

## 2019-07-09 MED FILL — METOPROLOL SUCCINATE ER 25: 25 | 30 days supply | Qty: 30 | Fill #5

## 2019-07-09 MED FILL — HUMULIN R 500 UNITS/ML VIAL: 500 | 23 days supply | Qty: 20 | Fill #4

## 2019-07-19 MED FILL — HYDROCODON-APAP 10-325: 10-325 | 30 days supply | Qty: 120 | Fill #0

## 2019-07-19 MED FILL — ESCITALOPRAM 20 MG TABLET: 20 | 90 days supply | Qty: 90 | Fill #1

## 2019-07-19 MED FILL — PREGABALIN 50 MG CAPS: 50 | 30 days supply | Qty: 60 | Fill #2

## 2019-07-19 MED FILL — JARDIANCE 25 MG TABLET: 25 | 30 days supply | Qty: 30 | Fill #5

## 2019-08-11 MED FILL — METOPROLOL SUCCINATE ER 25: 25 | 30 days supply | Qty: 30 | Fill #0

## 2019-08-11 MED FILL — ALLOPURINOL 300 MG TABLET: 300 | 30 days supply | Qty: 30 | Fill #6

## 2019-08-11 MED FILL — HUMULIN R 500 UNITS/ML VIAL: 500 | 23 days supply | Qty: 20 | Fill #5

## 2019-08-11 MED FILL — VASCEPA 1 GM CAPSULE: 1 | 30 days supply | Qty: 120 | Fill #4

## 2019-08-11 MED FILL — ATORVASTATIN 80 MG TABLET: 80 | 90 days supply | Qty: 90 | Fill #2

## 2019-08-13 MED FILL — valACYclovir HCL 1 GM TABS: 1 | 8 days supply | Qty: 30 | Fill #1

## 2019-08-18 MED FILL — HYDROCODON-APAP 10-325: 10-325 | 30 days supply | Qty: 120 | Fill #0

## 2019-08-19 MED FILL — PREGABALIN 50 MG CAPS: 50 | 30 days supply | Qty: 60 | Fill #3

## 2019-08-19 MED FILL — JARDIANCE 25 MG TABLET: 25 | 30 days supply | Qty: 30 | Fill #6

## 2019-08-30 ENCOUNTER — Encounter: Payer: Self-pay | Admitting: Internal Medicine

## 2019-08-30 ENCOUNTER — Other Ambulatory Visit: Payer: Self-pay

## 2019-08-30 ENCOUNTER — Ambulatory Visit (INDEPENDENT_AMBULATORY_CARE_PROVIDER_SITE_OTHER): Payer: PRIVATE HEALTH INSURANCE | Admitting: Internal Medicine

## 2019-08-30 VITALS — BP 130/70 | HR 89 | Temp 97.2°F | Ht 70.0 in | Wt 284.4 lb

## 2019-08-30 DIAGNOSIS — R0602 Shortness of breath: Secondary | ICD-10-CM

## 2019-08-30 DIAGNOSIS — J986 Disorders of diaphragm: Secondary | ICD-10-CM | POA: Diagnosis not present

## 2019-08-30 NOTE — Patient Instructions (Addendum)
The patient should have follow up scheduled with myself in 3 months.  Please schedule SNIFF test in radiology.   Prior to next visit patient should have a full set of PFTs ASAP including:  Spirometry with bronchodilator if obstructed Lung Volumes DLCO FeNO

## 2019-08-30 NOTE — Progress Notes (Signed)
Hobbs Parola Pe    [id number]    17-May-1961  Primary Care Physician:South, Annie Main, MD  Referring Physician: Reynold Bowen, [place],   [id number] Reason for Consultation: shortness of breath Date of Consultation: 08/30/2019  Chief complaint:   Chief Complaint  Patient presents with  . Consult    shortness of breath all the time for 3 months, dry cough at times, wheezing,      HPI: 58 yo gentleman with a history of CAD, obesity and OSA who presents with shortness of breath for 3 months duration. Coughing after he lays down at night - dry.  Sleeps with 2 pillows at night.  Has had several sinus surgeries in the past and history of nasal polyps but no asthma or nsaid induced flares of breathing.   Dyspnea is mostly when he is up walking around but occasionally at rest.  Bending forward makes it worse, as does walking up hill and climbing stairs. Has to stop after a flight of stairs. No wheezing. Family has mentioned over the last 6 months he can't keep [place] as well as he used to.   Has gained 8 lbs over the summer. Has OSA and wears his CPAP and has had it for 2-3 years.  Has dryness with mask. Has nasal and mouth mask. But he falls asleep before he puts it on.     He had PFTs done 10 years ago - has worked as a IT trainer, Information systems manager, and worked in Secondary school teacher as well. No breathing difficulties as a child or prior to 3 months ago. No previous pneumonia.   From [place] originally. Lives at home with 2 kids. 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   Social History   Occupational History  . Occupation: Actuary  Tobacco Use  . Smoking status: Passive Smoke Exposure - Never Smoker  . Smokeless tobacco: Never Used  . Tobacco comment: tried for less than a year when 16  Substance and Sexual Activity  . Alcohol use: Yes    Comment: occ  . Drug use: No  . Sexual activity: Not on file    Relevant family  history:  Family History  Problem Relation Age of Onset  . Coronary artery disease Mother        MI in her 87's  . Heart attack Mother   . Diabetes Mother   . Emphysema Mother   . Prostate cancer Father   . Lung cancer Father   . Diabetes Father   . Asthma Son   . Colon cancer Neg Hx     Past Medical History:  Diagnosis Date  . Arthritis   . CAD (coronary artery disease)    a. 04/27/14 DES to dRCA; widely patent L coronary system b. cath 08/13/2015 no changes, patent RCA  . Chronic lower back pain   . Depression   . Gout   . History of echocardiogram    a. 04/27/14 with EF 65-70% and mild TR.  Marland Kitchen History of pancreatitis   . Metabolic syndrome   . Mixed hyperlipidemia   . Obesity (BMI 30-39.9) 02/16/2016  . OSA (obstructive sleep apnea) 11/26/2015   Severe with total AHI 104.8/hr and oxygen desaturations as low as 79%.  On CPAP at 13cm H2O  . Type 2 diabetes mellitus (HCC)    on Insulin pump    Past Surgical History:  Procedure Laterality Date  . CARDIAC CATHETERIZATION N/A  08/14/2015   Procedure: Left Heart Cath and Coronary Angiography;  Surgeon: Belva Crome, MD;  Location: [facility];  Service: Cardiovascular;  Laterality: N/A;  . CARPAL TUNNEL RELEASE Bilateral 2006-2008   left 12/2004; right 10/2006  . CHONDROPLASTY  04/20/2015   Procedure: CHONDROPLASTY;  Surgeon: Melrose Nakayama, MD;  Location: [place];  Service: Orthopedics;;  . FUNCTIONAL ENDOSCOPIC SINUS SURGERY  10/2001; 08/2005;   . KNEE ARTHROSCOPY Bilateral 2000's   right 08/2003;   . KNEE ARTHROSCOPY WITH MEDIAL MENISECTOMY Left 04/20/2015   Procedure: LEFT ARTHROSCOPY KNEE WITH MEDIAL MENISECTOMY;  Surgeon: Melrose Nakayama, MD;  Location: [facility];  Service: Orthopedics;  Laterality: Left;  Partial medial menisectomy, chondroplasty.  Marland Kitchen LEFT HEART CATHETERIZATION WITH CORONARY ANGIOGRAM N/A 04/27/2014   Procedure: LEFT HEART CATHETERIZATION WITH CORONARY ANGIOGRAM;  Surgeon:  [name], MD;  Location: [facility] C[facility];  Service: Cardiovascular;  Laterality: N/A;  . LUMBAR LAMINECTOMY Left 05/2006  . PERCUTANEOUS STENT INTERVENTION  04/27/2014   Procedure: PERCUTANEOUS STENT INTERVENTION;  Surgeon: [name], MD;  Location: [facility] CATH LAB;  Service: Cardiovascular;;  DES x2 (distal +prox RCA)  . POSTERIOR LUMBAR FUSION  07/2009  . SHOULDER ARTHROSCOPY Right ~ 2010     Review of systems: Review of Systems  Constitutional: Negative for chills, fever and weight loss.       Weight gain  HENT: Positive for congestion. Negative for sinus pain and sore throat.   Eyes: Negative for discharge and redness.  Respiratory: Positive for cough. Negative for hemoptysis, sputum production, shortness of breath and wheezing.   Cardiovascular: Positive for leg swelling. Negative for chest pain and palpitations.  Gastrointestinal: Negative for heartburn, nausea and vomiting.  Musculoskeletal: Positive for joint pain. Negative for myalgias.  Skin: Negative for rash.  Neurological: Negative for dizziness, tremors, focal weakness and headaches.  Endo/Heme/Allergies: Negative for environmental allergies.  Psychiatric/Behavioral: Negative for depression. The patient is not nervous/anxious.   All other systems reviewed and are negative.   Physical Exam: Blood pressure 130/70, pulse 89, temperature (!) 97.2 F (36.2 C), temperature source Temporal, height 5\' 10"  (1.778 m), weight 284 lb 6.4 oz (129 kg), SpO2 96 %. Gen:      No acute distress Eyes: EOMI, sclera anicteric ENT:  no nasal polyps, mucus membranes moist Neck:     Supple, no thyromegaly Lungs:    No increased respiratory effort, symmetric chest wall excursion, clear to auscultation bilaterally, no wheezes or crackles CV:         Regular rate and rhythm; no murmurs, rubs, or gallops.  No pedal edema Abd:     Obese,  + bowel sounds; soft, non-tender MSK: no acute synovitis of DIP or PIP joints, no mechanics hands.    Skin:      Warm and dry; no rashes Neuro: normal speech, no focal facial asymmetry Psych: alert and oriented x3, normal mood and affect  Data Reviewed: Imaging: I have personally reviewed the c chest x-rays from 2003, and compared them to the ones in 2007 and present day.  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  Labs: Lab Results  Component Value Date   WBC 9.2 06/22/2016   HGB 15.4 06/22/2016   HCT 45.1 06/22/2016   MCV 87.2 06/22/2016   PLT 234 06/22/2016    Immunization status: Immunization History  Administered Date(s) Administered  . Influenza Whole 06/07/2019  . Pneumococcal Polysaccharide-23  08/27/2010    Assessment:  Dyspnea CAD OSA - on CPAP Obesity Body mass index is 40.81 kg/m.   Plan/Recommendations: Mr. 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  His symptoms worsened over the summer while he gained a little bit of weight.  We discussed obtaining a sniff test and PFTs to further evaluate this.  In the meantime weight loss and adherence to his CPAP are paramount in improving his symptoms.  We discussed that a referral to thoracic surgery may be helpful, but ultimately diaphragmatic plication is not always efficacious.  I will see him back in 3 months after this testing.  He does have a nocturnal cough which may be consistent with GERD.  A trial of PPI may be helpful.  Hemoglobin of 15 is in the normal range for a 58 year old man.  His oxygen saturations are normal during the day, but he does have nocturnal desaturation to OSA.  Return to Care: No follow-ups on file.  [name], MD Pulmonary and [place]  CC: Reynold Bowen, MD

## 2019-09-03 ENCOUNTER — Ambulatory Visit (HOSPITAL_COMMUNITY): Payer: PRIVATE HEALTH INSURANCE

## 2019-09-13 MED FILL — VASCEPA 1 GM CAPSULE: 1 | 30 days supply | Qty: 120 | Fill #5

## 2019-09-13 MED FILL — METOPROLOL SUCCINATE ER 25: 25 | 30 days supply | Qty: 30 | Fill #1

## 2019-09-14 MED FILL — HUMULIN R 500 UNITS/ML VIAL: 500 | 23 days supply | Qty: 20 | Fill #0

## 2019-09-14 MED FILL — ALLOPURINOL 300 MG TABLET: 300 | 30 days supply | Qty: 30 | Fill #0

## 2019-09-15 MED FILL — HYDROCODON-APAP 10-325: 10-325 | 30 days supply | Qty: 120 | Fill #0

## 2019-09-28 ENCOUNTER — Ambulatory Visit (HOSPITAL_COMMUNITY): Admission: RE | Admit: 2019-09-28 | Payer: PRIVATE HEALTH INSURANCE | Source: Ambulatory Visit

## 2019-09-30 ENCOUNTER — Encounter: Payer: Self-pay | Admitting: Pulmonary Disease

## 2019-09-30 ENCOUNTER — Telehealth: Payer: Self-pay | Admitting: Internal Medicine

## 2019-09-30 ENCOUNTER — Telehealth (INDEPENDENT_AMBULATORY_CARE_PROVIDER_SITE_OTHER): Payer: PRIVATE HEALTH INSURANCE | Admitting: Pulmonary Disease

## 2019-09-30 ENCOUNTER — Telehealth: Payer: Self-pay | Admitting: Pulmonary Disease

## 2019-09-30 DIAGNOSIS — J0100 Acute maxillary sinusitis, unspecified: Secondary | ICD-10-CM | POA: Insufficient documentation

## 2019-09-30 DIAGNOSIS — U071 COVID-19: Secondary | ICD-10-CM | POA: Diagnosis not present

## 2019-09-30 DIAGNOSIS — G4733 Obstructive sleep apnea (adult) (pediatric): Secondary | ICD-10-CM

## 2019-09-30 MED ORDER — AMOXICILLIN-POT CLAVULANATE 875-125 MG PO TABS: 1.0000 | ORAL_TABLET | Freq: Two times a day (BID) | ORAL | 0 refills | Status: DC

## 2019-09-30 MED ORDER — PREDNISONE 10 MG PO TABS: ORAL_TABLET | ORAL | 0 refills | Status: DC

## 2019-09-30 NOTE — Telephone Encounter (Signed)
As patient is currently symptomatic and still within the 10-day timeframe.  He may be a candidate for monoclonal antibody infusion.  Patient can also be scheduled for a virtual visit with a provider if they would like.  Patient's pulmonary function test which is scheduled in February/2021 will need to be rescheduled 90 days from Covid positive.  We will need to have a copy of his rapid test faxed to our office.  Patient will need to coordinate this.We will route this message to Tammy.  To see if the patient would be a candidate.Wyn Quaker, FNP

## 2019-09-30 NOTE — Telephone Encounter (Signed)
If sats are low at 90-92% (typiccal are 96% ) , he is not a candidate for M . Ab infusion . Oxygen needs.levels  can not be changed . Would recommend a video visit if he is sick for further evaluation   Please contact office for sooner follow up if symptoms do not improve or worsen or seek emergency care

## 2019-09-30 NOTE — Progress Notes (Addendum)
Virtual Visit via Video Note  I connected with Alec [name] on 09/30/19 at  3:00 PM EST by a video enabled telemedicine application and verified that I am speaking with the correct person using two identifiers.  Location: Patient: Home Provider: Office - [facility] - [id number], [address], [place], [place] [address]  I discussed the limitations of evaluation and management by telemedicine and the availability of in person appointments. The patient expressed understanding and agreed to proceed. I also discussed with the patient that there may be a patient responsible charge related to this service. The patient expressed understanding and agreed to proceed.  Patient consented to consult via telephone: Yes People present and their role in pt care: Pt   History of Present Illness:  59 year old male initially consulted with our practice on 08/30/2019 initially consulted with our practice on 08/30/2019 for shortness of breath.  Past medical history: Type 2 diabetes, CAD, hyperlipidemia, metabolic syndrome, gout, depression Smoking history: Former smoker Maintenance: Patient of Dr. Shearon Stalls  Chief complaint:    59 year old male initially consulted with our practice on 08/30/2019 initially consulted with our practice on 08/30/2019 for shortness of breath.  At that time patient was reporting he has had shortness of breath for around 3 months.  He also has known CAD, obesity and obstructive sleep apnea which is managed by Dr. Radford Pax with cardiology.  The plan as of 08/30/2019 was: Patient was recommended to complete a sniff test in radiology as well as to have pulmonary function testing.  Needed the sniff test or the pulmonary function test have been completed yet.  Patient completing video visit with our office today.  Patient started develop symptoms of nasal congestion, fever, cough on 09/29/2019.  Patient denies any known sick contacts.  Patient's spouse who he lives with is currently  asymptomatic.  [facility] where he tested positive for Covid.  Sinus drainage is yellow-green.  Current temperature today is 101.  Patient's oxygen saturations are running between 90 to 95% on room air.  Observations/Objective:  09/30/2019 - SPO2 - 91-95 on RA  09/30/2019 - SPO2 - 91 on RA with exertion  09/30/2019 - HR - 90 09/30/2019 - temp - 101  09/19/2017-CT maxillofacial-left maxillary sinus retention cyst, left maxillary antrostomy postsurgical changes, normal aeration of mastoid air cells  08/12/2015-echocardiogram-LV ejection fraction 60 to 123456, grade 1 diastolic dysfunction, unable to fully evaluate pulmonary artery systolic pressure  [id number] - [place] - positive   Social History   Tobacco Use  Smoking Status Passive Smoke Exposure - Never Smoker  Smokeless Tobacco Never Used  Tobacco Comment   tried for less than a year when 16   Immunization History  Administered Date(s) Administered  . Influenza Whole 06/07/2019  . Pneumococcal Polysaccharide-23 08/27/2010    Assessment and Plan:  OSA (obstructive sleep apnea) Plan: Continue follow-up with cardiology Continue to utilize your CPAP  Sinusitis, acute maxillary Plan: Augmentin today Prednisone course today Close follow-up with our office on 10/04/2019  COVID-19 virus detected Plan: Augmentin today Prednisone course today We will investigate to see if the patient would be a candidate for outpatient monoclonal antibody infusion >>> Patient would be ideal based off of timeframe of disease, patient slightly worsened hypoxemia from baseline oxygen saturation in December/2020 revealing 96% at last office visit  [place] home monitoring ordered today Encourage patient to utilize Tylenol and/or ibuprofen/NSAIDs for management of temperatures  Reviewed with patient  as well as spouse that if symptoms worsen to contact our office we could coordinate an  [facility]ment [facility] If oxygen levels are dropping below 88% and symptoms have worsened patient needs to contact 911 or present to an emergency room for emergent evaluation  Addendum: 09/30/2019  I connected by phone with [name] on 09/30/2019 at 4:41 PM to discuss the potential use of an new treatment for mild to moderate COVID-19 viral infection in non-hospitalized patients.  This patient is a 59 y.o. male that meets the FDA criteria for Emergency Use Authorization of bamlanivimab or casirivimab\imdevimab.  Has a (+) direct SARS-CoV-2 viral test result  Has mild or moderate COVID-19   Is ? 58 years of age and weighs ? 40 kg  Is NOT hospitalized due to COVID-19  Is NOT requiring oxygen therapy or requiring an increase in baseline oxygen flow rate due to COVID-19  Is within 10 days of symptom onset  Has at least one of the high risk factor(s) for progression to severe COVID-19 and/or hospitalization as defined in EUA.  Specific high risk criteria : BMI >/= 35   I have spoken and communicated the following to the patient or parent/caregiver:  1. FDA has authorized the emergency use of bamlanivimab and casirivimab\imdevimab for the treatment of mild to moderate COVID-19 in adults and pediatric patients with positive results of direct SARS-CoV-2 viral testing who are 11 years of age and older weighing at least 40 kg, and who are at high risk for progressing to severe COVID-19 and/or hospitalization.  2. The significant known and potential risks and benefits of bamlanivimab and casirivimab\imdevimab, and the extent to which such potential risks and benefits are unknown.  3. Information on available alternative treatments and the risks and benefits of those alternatives, including clinical trials.  4. Patients treated with bamlanivimab and casirivimab\imdevimab should continue to self-isolate and use infection control measures (e.g.,  wear mask, isolate, social distance, avoid sharing personal items, clean and disinfect "high touch" surfaces, and frequent handwashing) according to CDC guidelines.   5. The patient or parent/caregiver has the option to accept or refuse bamlanivimab or casirivimab\imdevimab .  After reviewing this information with the patient, The patient agreed to proceed with receiving the bamlanimivab infusion and will be provided a copy of the Fact sheet prior to receiving the infusion.   Lauraine Rinne 09/30/2019 4:41 PM       Follow Up Instructions:  Return in about 4 days (around 10/04/2019), or if symptoms worsen or fail to improve, for Follow up with Wyn Quaker FNP-C.    I discussed the assessment and treatment plan with the patient. The patient was provided an opportunity to ask questions and all were answered. The patient agreed with the plan and demonstrated an understanding of the instructions.   The patient was advised to call back or seek an in-person evaluation if the symptoms worsen or if the condition fails to improve as anticipated.  I provided 32 minutes of non-face-to-face time during this encounter.   Lauraine Rinne, NP

## 2019-09-30 NOTE — Patient Instructions (Addendum)
You were seen today by Lauraine Rinne, NP  for:   1. COVID-19 virus detected  - MYCHART COVID-19 HOME MONITORING PROGRAM; Future - Temperature monitoring; Future  Continue to monitor temperatures and oxygen levels routinely multiple times a day  We will treat you empirically with Augmentin as well as prednisone taper  We will investigate whether or not you would be a candidate for the outpatient antibody infusion  2. OSA (obstructive sleep apnea)  Continue to utilize CPAP  Keep follow-up with Dr. Radford Pax  3. Acute maxillary sinusitis, recurrence not specified  - amoxicillin-clavulanate (AUGMENTIN) 875-125 MG tablet; Take 1 tablet by mouth 2 (two) times daily.  Dispense: 14 tablet; Refill: 0 - predniSONE (DELTASONE) 10 MG tablet; 4 tabs for 2 days, then 3 tabs for 2 days, 2 tabs for 2 days, then 1 tab for 2 days, then stop  Dispense: 20 tablet; Refill: 0   We recommend today:   Meds ordered this encounter  Medications  . amoxicillin-clavulanate (AUGMENTIN) 875-125 MG tablet    Sig: Take 1 tablet by mouth 2 (two) times daily.    Dispense:  14 tablet    Refill:  0  . predniSONE (DELTASONE) 10 MG tablet    Sig: 4 tabs for 2 days, then 3 tabs for 2 days, 2 tabs for 2 days, then 1 tab for 2 days, then stop    Dispense:  20 tablet    Refill:  0    Follow Up:    Return in about 4 days (around 10/04/2019), or if symptoms worsen or fail to improve, for Follow up with Wyn Quaker FNP-C.   Please do your part to reduce the spread of COVID-19:      Reduce your risk of any infection  and COVID19 by using the similar precautions used for avoiding the common cold or flu:  Marland Kitchen Wash your hands often with soap and warm water for at least 20 seconds.  If soap and water are not readily available, use an alcohol-based hand sanitizer with at least 60% alcohol.  . If coughing or sneezing, cover your mouth and nose by coughing or sneezing into the elbow areas of your shirt or coat, into a tissue  or into your sleeve (not your hands). Langley Gauss A MASK when in public  . Avoid shaking hands with others and consider head nods or verbal greetings only. . Avoid touching your eyes, nose, or mouth with unwashed hands.  . Avoid close contact with people who are sick. . Avoid places or events with large numbers of people in one location, like concerts or sporting events. . If you have some symptoms but not all symptoms, continue to monitor at home and seek medical attention if your symptoms worsen. . If you are having a medical emergency, call 911.   [place] / e-Visit: [email or web address]         [facility]: [facility]: [id number]                   [facility]: [id number]     It is flu season:   >>> Best ways to protect herself from the flu: Receive the yearly flu vaccine, practice good hand hygiene washing with soap and also using hand sanitizer when available, eat a nutritious meals, get adequate rest, hydrate appropriately   Please contact the office if your symptoms worsen or you have concerns that  you are not improving.   Thank you for choosing [facility] for your healthcare, and for allowing [place] to partner with you on your healthcare journey. I am thankful to be able to provide care to you today.   Wyn Quaker FNP-C

## 2019-09-30 NOTE — Addendum Note (Signed)
Addended by: Lauraine Rinne on: 09/30/2019 04:43 PM   Modules accepted: Orders, SmartSet

## 2019-09-30 NOTE — Telephone Encounter (Signed)
09/30/2019 1622  Called and spoke with patient and spouse.  Primary care had prescribed Levaquin.  Our office prescribed Augmentin.  After discussion patient will proceed forward with Augmentin as discussed at our office visit today.  Also discussed with patient outpatient monoclonal antibody infusion.  See virtual visit note regarding this.  Orders have been placed.  Patient has been scheduled for 10/04/2019 at 0830.  Lauraine Rinne 09/30/2019 4:37 PM

## 2019-09-30 NOTE — Telephone Encounter (Signed)
Called and spoke to pt. Pt states he [facility], this resulted [facility]. Pts spo2 has been 90-92% on RA. Pt states he also had a fever last night of 102 and took Ibuprofen and has not had a fever today. Pt also c/o non prod cough, sinus pressure/congestion, PND x all s/s started 5 days ago. Pt aware to seek emergency care if his levels go 88% or below or if any other s/s worsen.   Dr. Shearon Stalls is unavailable today, will send to APP of the day, Wyn Quaker, NP.

## 2019-09-30 NOTE — Telephone Encounter (Signed)
Pt is already scheduled for a mychart video visit with Aaron Edelman today at 3 pm

## 2019-09-30 NOTE — Assessment & Plan Note (Addendum)
Plan: Continue follow-up with cardiology Continue to utilize your CPAP

## 2019-09-30 NOTE — Telephone Encounter (Signed)
Called and spoke with pt's wife Benjamine Mola and stated to her the info per Aaron Edelman. Stated to her that we will have to cancel pt's PFT and have it be performed 90 days from positive covid test and she verbalized understanding.   Pt has been scheduled a mychart visit with Aaron Edelman today at 3pm. Nothing further needed.

## 2019-09-30 NOTE — Assessment & Plan Note (Signed)
Plan: Augmentin today Prednisone course today Close follow-up with our office on 10/04/2019

## 2019-09-30 NOTE — Assessment & Plan Note (Addendum)
Plan: Augmentin today Prednisone course today We will investigate to see if the patient would be a candidate for outpatient monoclonal antibody infusion >>> Patient would be ideal based off of timeframe of disease, patient slightly worsened hypoxemia from baseline oxygen saturation in December/2020 revealing 96% at last office visit  [place] home monitoring ordered today Encourage patient to utilize Tylenol and/or ibuprofen/NSAIDs for management of temperatures  Reviewed with patient as well as spouse that if symptoms worsen to contact our office we could coordinate an appointment at [facility] If oxygen levels are dropping below 88% and symptoms have worsened patient needs to contact 911 or present to an emergency room for emergent evaluation

## 2019-10-01 ENCOUNTER — Encounter (INDEPENDENT_AMBULATORY_CARE_PROVIDER_SITE_OTHER): Payer: Self-pay

## 2019-10-02 ENCOUNTER — Telehealth: Payer: Self-pay

## 2019-10-02 ENCOUNTER — Encounter (INDEPENDENT_AMBULATORY_CARE_PROVIDER_SITE_OTHER): Payer: Self-pay

## 2019-10-02 ENCOUNTER — Other Ambulatory Visit (HOSPITAL_COMMUNITY): Payer: PRIVATE HEALTH INSURANCE

## 2019-10-02 NOTE — Telephone Encounter (Signed)
More active cough today. Pt stated he is on a prednisone taper and abx. Pt stated he is having mild wheezing but stated that he doesn't feel the wheezing has worsened. Advised pt to go to [facility] or ED if having difficulty breathing' Care advice given: If cough remains the same or better tx with OTC cough medications. Try hard candy or cough drops and drinking warm fluids and to take 2 tsp of honey at night. If cough is becoming  Worse or is not sleeping at night or phlegm is green or yellow contact PCP.

## 2019-10-03 ENCOUNTER — Encounter (INDEPENDENT_AMBULATORY_CARE_PROVIDER_SITE_OTHER): Payer: Self-pay

## 2019-10-03 NOTE — Progress Notes (Signed)
Virtual Visit via Video Note  I connected with Alec [name] on 10/04/19 at  3:00 PM EST by a video enabled telemedicine application and verified that I am speaking with the correct person using two identifiers.  Location: Patient: Home Provider: Office - [facility] - [id number], [address], [place], [place] [address]  I discussed the limitations of evaluation and management by telemedicine and the availability of in person appointments. The patient expressed understanding and agreed to proceed. I also discussed with the patient that there may be a patient responsible charge related to this service. The patient expressed understanding and agreed to proceed.  Patient consented to consult via telephone: Yes People present and their role in pt care: Pt   History of Present Illness:  59 year old male initially consulted with our practice on 08/30/2019 initially consulted with our practice on 08/30/2019 for shortness of breath.  Past medical history: Type 2 diabetes, CAD, hyperlipidemia, metabolic syndrome, gout, depression Smoking history: Former smoker Maintenance: Patient of Dr. Shearon Stalls  Chief complaint: S/p infusion   59 year old male never smoker/passive smoke exposure followed in our office for dyspnea on exertion.  Patient tested positive for SARS-CoV-2 on 09/30/2019.  Patient was treated telephonically with Augmentin as well as a course of prednisone.  He is also scheduled for the outpatient monoclonal antibody infusion.  Patient reports that this infusion went well today.  He tolerated it well.  He does feel like he is doing better.  He is more fatigued from having to do more and going to the appointment today at the infusion center.  He does feel he has some worsening congestion today.  Patient is no longer carrying a temperature.  Last low-grade temp was on 10/02/2019.  Observations/Objective:  09/19/2017-CT maxillofacial-left maxillary sinus retention cyst, left maxillary  antrostomy postsurgical changes, normal aeration of mastoid air cells  08/12/2015-echocardiogram-LV ejection fraction 60 to 123456, grade 1 diastolic dysfunction, unable to fully evaluate pulmonary artery systolic pressure  99991111 - [place] - positive   Social History   Tobacco Use  Smoking Status Passive Smoke Exposure - Never Smoker  Smokeless Tobacco Never Used  Tobacco Comment   tried for less than a year when 24   Immunization History  Administered Date(s) Administered  . Influenza Whole 06/07/2019  . Pneumococcal Polysaccharide-23 08/27/2010    Assessment and Plan:  COVID-19 virus detected Plan: Continue to monitor symptoms If symptoms worsen or fevers develop please contact our office If you continue to have persistent nasal congestion or worsened sinus symptoms please contact primary care or our office and we can discuss an ENT referral and potentially a different course of antibiotics  Sinusitis, acute maxillary Plan: We will continue to clinically monitor. Finish Augmentin  OSA (obstructive sleep apnea) Plan: Continue follow-up with cardiology Continue CPAP  Shortness of breath Plan: We will get patient rescheduled for pulmonary function testing Will coordinate a follow-up with Dr. Shearon Stalls   Follow Up Instructions:  Return in about 2 months (around 12/02/2019), or if symptoms worsen or fail to improve, for Follow up with Dr. Shearon Stalls, Follow up for PFT.    I discussed the assessment and treatment plan with the patient. The patient was provided an opportunity to ask questions and all were answered. The patient agreed with the plan and demonstrated an understanding of the instructions.   The patient was advised to call back or seek an in-person evaluation if the symptoms worsen or if the condition fails to improve  as anticipated.  I provided 32 minutes of non-face-to-face time during this encounter.   Lauraine Rinne, NP

## 2019-10-04 ENCOUNTER — Encounter: Payer: Self-pay | Admitting: Pulmonary Disease

## 2019-10-04 ENCOUNTER — Ambulatory Visit (HOSPITAL_COMMUNITY)
Admission: RE | Admit: 2019-10-04 | Discharge: 2019-10-04 | Disposition: A | Discharge status: Home / Self Care | Payer: PRIVATE HEALTH INSURANCE | Source: Ambulatory Visit | Attending: Pulmonary Disease | Admitting: Pulmonary Disease

## 2019-10-04 ENCOUNTER — Other Ambulatory Visit: Payer: Self-pay

## 2019-10-04 ENCOUNTER — Encounter (INDEPENDENT_AMBULATORY_CARE_PROVIDER_SITE_OTHER): Payer: Self-pay

## 2019-10-04 ENCOUNTER — Telehealth (INDEPENDENT_AMBULATORY_CARE_PROVIDER_SITE_OTHER): Payer: PRIVATE HEALTH INSURANCE | Admitting: Pulmonary Disease

## 2019-10-04 DIAGNOSIS — R0602 Shortness of breath: Secondary | ICD-10-CM | POA: Diagnosis not present

## 2019-10-04 DIAGNOSIS — G4733 Obstructive sleep apnea (adult) (pediatric): Secondary | ICD-10-CM | POA: Diagnosis not present

## 2019-10-04 DIAGNOSIS — Z23 Encounter for immunization: Secondary | ICD-10-CM | POA: Insufficient documentation

## 2019-10-04 DIAGNOSIS — J0100 Acute maxillary sinusitis, unspecified: Secondary | ICD-10-CM

## 2019-10-04 DIAGNOSIS — Z9989 Dependence on other enabling machines and devices: Secondary | ICD-10-CM

## 2019-10-04 DIAGNOSIS — U071 COVID-19: Secondary | ICD-10-CM | POA: Diagnosis present

## 2019-10-04 DIAGNOSIS — Z792 Long term (current) use of antibiotics: Secondary | ICD-10-CM

## 2019-10-04 MED ORDER — SODIUM CHLORIDE 0.9 % IV SOLN
INTRAVENOUS | Status: DC | PRN
  Administered 2019-10-04: 09:00:00 250 mL via INTRAVENOUS

## 2019-10-04 MED ORDER — SODIUM CHLORIDE 0.9 % IV SOLN
700.0000 mg | Freq: Once | INTRAVENOUS | Status: AC
  Administered 2019-10-04: 700 mg via INTRAVENOUS
  Filled 2019-10-04: qty 20

## 2019-10-04 MED ORDER — BENZONATATE 200 MG PO CAPS: 200.0000 mg | ORAL_CAPSULE | Freq: Three times a day (TID) | ORAL | 1 refills | Status: DC | PRN

## 2019-10-04 MED ORDER — FAMOTIDINE IN NACL 20-0.9 MG/50ML-% IV SOLN: 20.0000 mg | Freq: Once | INTRAVENOUS | Status: DC | PRN

## 2019-10-04 MED ORDER — DIPHENHYDRAMINE HCL 50 MG/ML IJ SOLN: 50.0000 mg | Freq: Once | INTRAMUSCULAR | Status: DC | PRN

## 2019-10-04 MED ORDER — METHYLPREDNISOLONE SODIUM SUCC 125 MG IJ SOLR: 125.0000 mg | Freq: Once | INTRAMUSCULAR | Status: DC | PRN

## 2019-10-04 MED ORDER — EPINEPHRINE 0.3 MG/0.3ML IJ SOAJ: 0.3000 mg | Freq: Once | INTRAMUSCULAR | Status: DC | PRN

## 2019-10-04 MED ORDER — ALBUTEROL SULFATE HFA 108 (90 BASE) MCG/ACT IN AERS: 2.0000 | INHALATION_SPRAY | Freq: Once | RESPIRATORY_TRACT | Status: DC | PRN

## 2019-10-04 NOTE — Assessment & Plan Note (Signed)
Plan: We will continue to clinically monitor. Finish Augmentin

## 2019-10-04 NOTE — Patient Instructions (Addendum)
You were seen today by Lauraine Rinne, NP  for:   1. COVID-19 virus detected  - benzonatate (TESSALON) 200 MG capsule; Take 1 capsule (200 mg total) by mouth 3 (three) times daily as needed for cough.  Dispense: 30 capsule; Refill: 1  2. Acute maxillary sinusitis, recurrence not specified  Continue to monitor your symptoms If symptoms persist please present to primary care and discuss an ENT referral  3. OSA (obstructive sleep apnea)  Continue to work with cardiology Continue CPAP  4. Shortness of breath  We will reschedule your pulmonary function test We will also schedule a follow-up with Dr. Shearon Stalls   We recommend today:   Meds ordered this encounter  Medications  . benzonatate (TESSALON) 200 MG capsule    Sig: Take 1 capsule (200 mg total) by mouth 3 (three) times daily as needed for cough.    Dispense:  30 capsule    Refill:  1    Follow Up:    Return in about 2 months (around 12/02/2019), or if symptoms worsen or fail to improve, for Follow up with Dr. Shearon Stalls, Follow up for PFT.   Please do your part to reduce the spread of COVID-19:      Reduce your risk of any infection  and COVID19 by using the similar precautions used for avoiding the common cold or flu:  Marland Kitchen Wash your hands often with soap and warm water for at least 20 seconds.  If soap and water are not readily available, use an alcohol-based hand sanitizer with at least 60% alcohol.  . If coughing or sneezing, cover your mouth and nose by coughing or sneezing into the elbow areas of your shirt or coat, into a tissue or into your sleeve (not your hands). Langley Gauss A MASK when in public  . Avoid shaking hands with others and consider head nods or verbal greetings only. . Avoid touching your eyes, nose, or mouth with unwashed hands.  . Avoid close contact with people who are sick. . Avoid places or events with large numbers of people in one location, like concerts or sporting events. . If you have some symptoms but  not all symptoms, continue to monitor at home and seek medical attention if your symptoms worsen. . If you are having a medical emergency, call 911.   [place] / e-Visit: [email or web address]         [facility]: [facility]: [id number]                   [facility]: [id number]     It is flu season:   >>> Best ways to protect herself from the flu: Receive the yearly flu vaccine, practice good hand hygiene washing with soap and also using hand sanitizer when available, eat a nutritious meals, get adequate rest, hydrate appropriately   Please contact the office if your symptoms worsen or you have concerns that you are not improving.   Thank you for choosing [facility] for your healthcare, and for allowing [place] to partner with you on your healthcare journey. I am thankful to be able to provide care to you today.   Wyn Quaker FNP-C

## 2019-10-04 NOTE — Assessment & Plan Note (Signed)
Plan: We will get patient rescheduled for pulmonary function testing Will coordinate a follow-up with Dr. Shearon Stalls

## 2019-10-04 NOTE — Assessment & Plan Note (Signed)
Plan: Continue follow-up with cardiology Continue CPAP

## 2019-10-04 NOTE — Assessment & Plan Note (Signed)
Plan: Continue to monitor symptoms If symptoms worsen or fevers develop please contact our office If you continue to have persistent nasal congestion or worsened sinus symptoms please contact primary care or our office and we can discuss an ENT referral and potentially a different course of antibiotics

## 2019-10-04 NOTE — Discharge Instructions (Signed)

## 2019-10-04 NOTE — Progress Notes (Signed)
  Diagnosis: COVID-19  Physician: Dr. Joya Gaskins  Procedure: [facility] Infusion Clinic Med: bamlanivimab infusion - Provided patient with bamlanimivab fact sheet for patients, parents and caregivers prior to infusion.  Complications: No immediate complications noted.  Discharge: Discharged home   Babs Sciara 10/04/2019

## 2019-10-05 ENCOUNTER — Encounter (INDEPENDENT_AMBULATORY_CARE_PROVIDER_SITE_OTHER): Payer: Self-pay

## 2019-10-06 ENCOUNTER — Encounter (INDEPENDENT_AMBULATORY_CARE_PROVIDER_SITE_OTHER): Payer: Self-pay

## 2019-10-08 ENCOUNTER — Encounter (INDEPENDENT_AMBULATORY_CARE_PROVIDER_SITE_OTHER): Payer: Self-pay

## 2019-10-09 ENCOUNTER — Encounter (INDEPENDENT_AMBULATORY_CARE_PROVIDER_SITE_OTHER): Payer: Self-pay

## 2019-10-12 ENCOUNTER — Encounter (INDEPENDENT_AMBULATORY_CARE_PROVIDER_SITE_OTHER): Payer: Self-pay

## 2019-10-12 ENCOUNTER — Other Ambulatory Visit: Payer: Self-pay | Admitting: Interventional Cardiology

## 2019-10-12 MED FILL — HUMULIN R 500 UNITS/ML VIAL: 500 | 23 days supply | Qty: 20 | Fill #1

## 2019-10-12 MED FILL — FENOFIBRATE 160 MG TABLET: 160 | 30 days supply | Qty: 30 | Fill #0

## 2019-10-12 MED FILL — RAMIPRIL 10 MG CAPSULE: 10 | 90 days supply | Qty: 90 | Fill #1

## 2019-10-12 MED FILL — METOPROLOL SUCCINATE ER 25: 25 | 30 days supply | Qty: 30 | Fill #2

## 2019-10-14 MED FILL — HYDROCODON-APAP 10-325: 10-325 | 30 days supply | Qty: 120 | Fill #0

## 2019-10-15 MED FILL — ALLOPURINOL 300 MG TABLET: 300 | 30 days supply | Qty: 30 | Fill #1

## 2019-10-15 MED FILL — VASCEPA 1 GM CAPSULE: 1 | 30 days supply | Qty: 120 | Fill #0

## 2019-10-25 ENCOUNTER — Other Ambulatory Visit (HOSPITAL_COMMUNITY): Payer: PRIVATE HEALTH INSURANCE

## 2019-11-10 ENCOUNTER — Other Ambulatory Visit: Payer: Self-pay

## 2019-11-10 ENCOUNTER — Encounter: Payer: Self-pay | Admitting: Internal Medicine

## 2019-11-10 ENCOUNTER — Ambulatory Visit: Payer: 59 | Admitting: Internal Medicine

## 2019-11-10 VITALS — BP 126/84 | HR 81 | Temp 98.4°F | Ht 70.0 in | Wt 280.0 lb

## 2019-11-10 DIAGNOSIS — J986 Disorders of diaphragm: Secondary | ICD-10-CM

## 2019-11-10 NOTE — Patient Instructions (Signed)
The patient should have follow up scheduled with myself in 3 months - virtual visit is fine.   Prior to next visit patient should have: Full set of PFTs SNIFF test.

## 2019-11-10 NOTE — Progress Notes (Signed)
Alec [name]    [id number]    [birth year]  Primary Care Physician:South, Annie Main, MD Date of Appointment: 11/10/2019 Established Patient Visit  Chief complaint:   Chief Complaint  Patient presents with  . Follow-up    1 month f/u after COVID. States he is returning back to normal. Denies any increase in SOB or coughing.      HPI: 59 year old gentleman who presented initially to see me late in 2020 for dyspnea on exertion.  Was found to have elevated right hemidiaphragm.  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  Sniff test and PFTs were ordered.  Interval Updates: In the interim he was diagnosed with COVID 19 pneumonia. Received prednisone and augmentin(for sinusitis). Also received monoclonal ab infusion and felt better after second day of infusion.   Breathing is feeling better because he is using his CPAP more .  I have reviewed the patient's family social and past medical history and updated as appropriate.   Past Medical History:  Diagnosis Date  . Arthritis   . CAD (coronary artery disease)    a. 04/27/14 DES to dRCA; widely patent L coronary system b. cath 08/13/2015 no changes, patent RCA  . Chronic lower back pain   . Depression   . Gout   . History of echocardiogram    a. 04/27/14 with EF 65-70% and mild TR.  Marland Kitchen History of pancreatitis   . Metabolic syndrome   . Mixed hyperlipidemia   . Obesity (BMI 30-39.9) 02/16/2016  . OSA (obstructive sleep apnea) 11/26/2015   Severe with total AHI 104.8/hr and oxygen desaturations as low as 79%.  On CPAP at 13cm H2O  . Type 2 diabetes mellitus (HCC)    on Insulin pump    Past Surgical History:  Procedure Laterality Date  . CARDIAC CATHETERIZATION N/A 08/14/2015   Procedure: Left Heart Cath and Coronary Angiography;  Surgeon: Belva Crome, MD;  Location: [place] [facility];  Service: Cardiovascular;  Laterality: N/A;  .  CARPAL TUNNEL RELEASE Bilateral 2006-2008   left 12/2004; right 10/2006  . CHONDROPLASTY  04/20/2015   Procedure: CHONDROPLASTY;  Surgeon: Melrose Nakayama, MD;  Location: [place];  Service: Orthopedics;;  . FUNCTIONAL ENDOSCOPIC SINUS SURGERY  10/2001; 08/2005;   . KNEE ARTHROSCOPY Bilateral 2000's   right 08/2003;   . KNEE ARTHROSCOPY WITH MEDIAL MENISECTOMY Left 04/20/2015   Procedure: LEFT ARTHROSCOPY KNEE WITH MEDIAL MENISECTOMY;  Surgeon: Melrose Nakayama, MD;  Location: [place];  Service: Orthopedics;  Laterality: Left;  Partial medial menisectomy, chondroplasty.  Marland Kitchen LEFT HEART CATHETERIZATION WITH CORONARY ANGIOGRAM N/A 04/27/2014   Procedure: LEFT HEART CATHETERIZATION WITH CORONARY ANGIOGRAM;  Surgeon: [name], MD;  Location: [facility] CATH LAB;  Service: Cardiovascular;  Laterality: N/A;  . LUMBAR LAMINECTOMY Left 05/2006  . PERCUTANEOUS STENT INTERVENTION  04/27/2014   Procedure: PERCUTANEOUS STENT INTERVENTION;  Surgeon: [name], MD;  Location: [facility] CATH LAB;  Service: Cardiovascular;;  DES x2 (distal +prox RCA)  . POSTERIOR LUMBAR FUSION  07/2009  . SHOULDER ARTHROSCOPY Right ~ 2010    Family History  Problem Relation Age of Onset  . Coronary artery disease Mother        MI in her 47's  . Heart attack Mother   . Diabetes Mother   . Emphysema Mother   . Prostate cancer Father   .  Lung cancer Father   . Diabetes Father   . Asthma Son   . Colon cancer Neg Hx     Social History   Occupational History  . Occupation: Actuary  Tobacco Use  . Smoking status: Passive Smoke Exposure - Never Smoker  . Smokeless tobacco: Never Used  . Tobacco comment: tried for less than a year when 16  Substance and Sexual Activity  . Alcohol use: Yes    Comment: occ  . Drug use: No  . Sexual activity: Not on file    Review of systems: Constitutional: No fevers, chills, night sweats, or weight loss. CV: No chest pain, or palpitations. Resp:  No hemoptysis.  Physical Exam: Blood pressure 126/84, pulse 81, temperature 98.4 F (36.9 C), temperature source Temporal, height 5\' 10"  (1.778 m), weight 280 lb (127 kg), SpO2 96 %.  Gen:      No acute distress Lungs:    No increased respiratory effort, symmetric chest wall excursion, clear to auscultation bilaterally, no wheezes or crackles CV:         Regular rate and rhythm; no murmurs, rubs, or gallops.  No pedal edema Abdomen: Distended, soft  Data Reviewed: Imaging: I have personally reviewed the c chest x-rays from 2003, and compared them to the ones in 2007 and present day.  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  PFTs: None available  Labs: None available  Immunization status: Immunization History  Administered Date(s) Administered  . Influenza Whole 06/07/2019  . Pneumococcal Polysaccharide-23 08/27/2010    Assessment:  Elevated right hemidiaphragm Covid 19 infection OSA on CPAP  Plan/Recommendations: Proceed with PFTs 90 days after positive covid test.  Proceed with sniff test. Continue CPAP  I spent 31 minutes on 11/10/2019 in care of this patient including face to face time and non-face to face time spent charting, review of outside records, and coordination of care.  Return to Care: Return in about 2 months (around 01/24/2020).   Alec Llamas, MD Pulmonary and [place]

## 2019-11-12 ENCOUNTER — Other Ambulatory Visit: Payer: Self-pay | Admitting: Interventional Cardiology

## 2019-11-12 MED FILL — VASCEPA 1 GM CAPSULE: 1 | 30 days supply | Qty: 120 | Fill #1

## 2019-11-12 MED FILL — HYDROCODON-APAP 10-325: 10-325 | 30 days supply | Qty: 120 | Fill #0

## 2019-11-12 MED FILL — ESCITALOPRAM 20 MG TABLET: 20 | 90 days supply | Qty: 90 | Fill #0

## 2019-11-12 MED FILL — HUMULIN R 500 UNITS/ML VIAL: 500 | 23 days supply | Qty: 20 | Fill #2

## 2019-11-12 MED FILL — FENOFIBRATE 160 MG TABLET: 160 | 30 days supply | Qty: 30 | Fill #0

## 2019-11-12 MED FILL — JARDIANCE 25 MG TABLET: 25 | 30 days supply | Qty: 30 | Fill #0

## 2019-11-12 MED FILL — ALLOPURINOL 300 MG TABLET: 300 | 30 days supply | Qty: 30 | Fill #2

## 2019-11-12 MED FILL — ATORVASTATIN 80 MG TABLET: 80 | 90 days supply | Qty: 90 | Fill #0

## 2019-11-12 MED FILL — METOPROLOL SUCCINATE ER 25: 25 | 30 days supply | Qty: 30 | Fill #3

## 2019-11-23 MED FILL — valACYclovir HCL 1 GM TABS: 1 | 8 days supply | Qty: 30 | Fill #2

## 2019-11-24 MED FILL — NITROGLYCERIN 0.4 MG TAB SL: 0.4 | 7 days supply | Qty: 25 | Fill #0

## 2019-11-25 ENCOUNTER — Encounter: Payer: Self-pay | Admitting: Cardiology

## 2019-11-25 ENCOUNTER — Telehealth (INDEPENDENT_AMBULATORY_CARE_PROVIDER_SITE_OTHER): Payer: PRIVATE HEALTH INSURANCE | Admitting: Cardiology

## 2019-11-25 ENCOUNTER — Telehealth: Payer: Self-pay | Admitting: *Deleted

## 2019-11-25 ENCOUNTER — Other Ambulatory Visit: Payer: Self-pay

## 2019-11-25 VITALS — Ht 71.0 in | Wt 285.0 lb

## 2019-11-25 DIAGNOSIS — G4733 Obstructive sleep apnea (adult) (pediatric): Secondary | ICD-10-CM | POA: Diagnosis not present

## 2019-11-25 DIAGNOSIS — Z6839 Body mass index (BMI) 39.0-39.9, adult: Secondary | ICD-10-CM | POA: Diagnosis not present

## 2019-11-25 DIAGNOSIS — I10 Essential (primary) hypertension: Secondary | ICD-10-CM

## 2019-11-25 NOTE — Patient Instructions (Signed)
Medication Instructions:  Your physician recommends that you continue on your current medications as directed. Please refer to the Current Medication list given to you today.  *If you need a refill on your cardiac medications before your next appointment, please call your pharmacy*  Follow-Up: At [facility], you and your health needs are our priority.  As part of our continuing mission to provide you with exceptional heart care, we have created designated Provider Care Teams.  These Care Teams include your primary Cardiologist (physician) and Advanced Practice Providers (APPs -  Physician Assistants and Nurse Practitioners) who all work together to provide you with the care you need, when you need it.  We recommend signing up for the patient portal called "MyChart".  Sign up information is provided on this After Visit Summary.  MyChart is used to connect with patients for Virtual Visits (Telemedicine).  Patients are able to view lab/test results, encounter notes, upcoming appointments, etc.  Non-urgent messages can be sent to your provider as well.   To learn more about what you can do with MyChart, go to [email or web address].    Your next appointment:   6 month(s)  The format for your next appointment:   Virtual Visit   Provider:   Fransico Him, MD

## 2019-11-25 NOTE — Telephone Encounter (Signed)

## 2019-11-25 NOTE — Progress Notes (Addendum)
Virtual Visit via Telephone Note   This visit type was conducted due to national recommendations for restrictions regarding the COVID-19 Pandemic (e.g. social distancing) in an effort to limit this patient's exposure and mitigate transmission in our community.  Due to his co-morbid illnesses, this patient is at least at moderate risk for complications without adequate follow up.  This format is felt to be most appropriate for this patient at this time.  The patient did not have access to video technology/had technical difficulties with video requiring transitioning to audio format only (telephone).  All issues noted in this document were discussed and addressed.  No physical exam could be performed with this format.  Please refer to the patient's chart for his  consent to telehealth for [facility]   Evaluation Performed:  Follow-up visit  This visit type was conducted due to national recommendations for restrictions regarding the COVID-19 Pandemic (e.g. social distancing).  This format is felt to be most appropriate for this patient at this time.  All issues noted in this document were discussed and addressed.  No physical exam was performed (except for noted visual exam findings with Video Visits).  Please refer to the patient's chart (MyChart message for video visits and phone note for telephone visits) for the patient's consent to telehealth for [facility].  Date:  11/25/2019   ID:  Alec [name], DOB 1960-11-23, MRN [id number]  Patient Location:  Home  Provider location:   [place]  PCP:  Reynold Bowen, MD  Cardiologist:  [name], MD  Sleep medicine:  Fransico Him, MD Electrophysiologist:  None   Chief Complaint:  OSA  History of Present Illness:    Alec [name] is a 59 y.o. male who presents via audio/video conferencing for a telehealth visit today.    Alec [name] is a 59 y.o. male with a hx of severe OSA with an AHI of 104/hr and oxygen desaturations as  low as 79%. The patient had severe snoring. Heis on CPAP therapy at13cm H2O.  When I saw him last month he was having problems with mouth and nose dryness.  He has a new full face mask which is working well for him.  He still has some problems with mouth dryness but the nose dryness has resolved and has not had any further epistaxis. He is feeling more rested in the am if he puts it on before he initially falls asleep.  He is not snoring any.   The patient does not have symptoms concerning for COVID-19 infection (fever, chills, cough, or new shortness of breath).   Prior CV studies:   The following studies were reviewed today:  PAP compliance download  Past Medical History:  Diagnosis Date  . Arthritis   . CAD (coronary artery disease)    a. 04/27/14 DES to dRCA; widely patent L coronary system b. cath 08/13/2015 no changes, patent RCA  . Chronic lower back pain   . Depression   . Gout   . History of echocardiogram    a. 04/27/14 with EF 65-70% and mild TR.  Marland Kitchen History of pancreatitis   . Metabolic syndrome   . Mixed hyperlipidemia   . Obesity (BMI 30-39.9) 02/16/2016  . OSA (obstructive sleep apnea) 11/26/2015   Severe with total AHI 104.8/hr and oxygen desaturations as low as 79%.  On CPAP at 13cm H2O  . Type 2 diabetes mellitus (HCC)    on Insulin pump   Past Surgical History:  Procedure  Laterality Date  . CARDIAC CATHETERIZATION N/A 08/14/2015   Procedure: Left Heart Cath and Coronary Angiography;  Surgeon: Belva Crome, MD;  Location: [place] [facility];  Service: Cardiovascular;  Laterality: N/A;  . CARPAL TUNNEL RELEASE Bilateral 2006-2008   left 12/2004; right 10/2006  . CHONDROPLASTY  04/20/2015   Procedure: CHONDROPLASTY;  Surgeon: Melrose Nakayama, MD;  Location: [place];  Service: Orthopedics;;  . FUNCTIONAL ENDOSCOPIC SINUS SURGERY  10/2001; 08/2005;   . KNEE ARTHROSCOPY Bilateral 2000's   right 08/2003;   . KNEE ARTHROSCOPY WITH MEDIAL MENISECTOMY Left  04/20/2015   Procedure: LEFT ARTHROSCOPY KNEE WITH MEDIAL MENISECTOMY;  Surgeon: Melrose Nakayama, MD;  Location: [place];  Service: Orthopedics;  Laterality: Left;  Partial medial menisectomy, chondroplasty.  Marland Kitchen LEFT HEART CATHETERIZATION WITH CORONARY ANGIOGRAM N/A 04/27/2014   Procedure: LEFT HEART CATHETERIZATION WITH CORONARY ANGIOGRAM;  Surgeon: [name], MD;  Location: [facility] CATH LAB;  Service: Cardiovascular;  Laterality: N/A;  . LUMBAR LAMINECTOMY Left 05/2006  . PERCUTANEOUS STENT INTERVENTION  04/27/2014   Procedure: PERCUTANEOUS STENT INTERVENTION;  Surgeon: [name], MD;  Location: [facility] CATH LAB;  Service: Cardiovascular;;  DES x2 (distal +prox RCA)  . POSTERIOR LUMBAR FUSION  07/2009  . SHOULDER ARTHROSCOPY Right ~ 2010     Current Meds  Medication Sig  . Alirocumab (PRALUENT [place]) Inject into the skin as directed.  Marland Kitchen allopurinol (ZYLOPRIM) 300 MG tablet Take 1 tablet (300 mg total) by mouth daily.  Marland Kitchen aspirin EC 81 MG tablet Take 81 mg by mouth daily.  Marland Kitchen atorvastatin (LIPITOR) 80 MG tablet Take 80 mg by mouth at bedtime.  . cetirizine (ZYRTEC) 10 MG tablet Take 10 mg by mouth daily.  . Empagliflozin (JARDIANCE) 25 MG TABS Take 25 mg by mouth daily.  Marland Kitchen escitalopram (LEXAPRO) 20 MG tablet TAKE 1 TABLET (20 MG) BY MOUTH DAILY.  . fenofibrate 160 MG tablet TAKE 1 TABLET BY MOUTH ONCE DAILY *PLEASE MAKE ANNUAL APPOINTMENT WITH DR. Tamala Julian FOR REFILLS*  . HYDROcodone-acetaminophen (NORCO) 5-325 MG tablet Take 1-2 tablets by mouth every 4 (four) hours as needed for moderate pain.  Marland Kitchen insulin regular human CONCENTRATED (HUMULIN R) 500 UNIT/ML SOLN injection Inject 500 Units into the skin 4 (four) times daily. 1.95 units per hour, bolus as needed blood suger  . metoprolol succinate (TOPROL-XL) 25 MG 24 hr tablet Take 25 mg by mouth daily.  . montelukast (SINGULAIR) 10 MG tablet Take 10 mg by mouth as needed.   . naproxen sodium (ANAPROX) 220 MG tablet Take 440 mg by  mouth daily as needed (pain).  Marland Kitchen NITROSTAT 0.4 MG SL tablet PLACE 1 TABLET (0.4 MG TOTAL) UNDER THE TONGUE EVERY 5 (FIVE) MINUTES X 3 DOSES AS NEEDED FOR CHEST PAIN. DIAL 911 AFTER 3 DOSES  . ONE TOUCH ULTRA TEST test strip 1 each by Other route as needed for other (blood sugar).   . pregabalin (LYRICA) 50 MG capsule Take 50 mg by mouth 2 (two) times daily.  . ramipril (ALTACE) 10 MG capsule Take 10 mg by mouth daily.   . valACYclovir (VALTREX) 1000 MG tablet Take 1,000 mg by mouth as needed.   Marland Kitchen VASCEPA 1 G CAPS Take 2 g by mouth 2 (two) times daily.      Allergies:   Percocet [oxycodone-acetaminophen]   Social History   Tobacco Use  . Smoking status: Passive Smoke Exposure - Never Smoker  . Smokeless tobacco: Never Used  .  Tobacco comment: tried for less than a year when 16  Substance Use Topics  . Alcohol use: Yes    Comment: occ  . Drug use: No     Family Hx: The patient's family history includes Asthma in his son; Coronary artery disease in his mother; Diabetes in his father and mother; Emphysema in his mother; Heart attack in his mother; Lung cancer in his father; Prostate cancer in his father. There is no history of Colon cancer.  ROS:   Please see the history of present illness.     All other systems reviewed and are negative.   Labs/Other Tests and Data Reviewed:    Recent Labs: No results found for requested labs within last 8760 hours.   Recent Lipid Panel Lab Results  Component Value Date/Time   CHOL 349 (H) 10/13/2015 08:58 AM   TRIG 2,598 (H) 10/13/2015 08:58 AM   HDL 27 (L) 10/13/2015 08:58 AM   CHOLHDL 12.9 (H) 10/13/2015 08:58 AM   LDLCALC NOT CALC 10/13/2015 08:58 AM   LDLDIRECT 65 10/13/2015 08:58 AM    Wt Readings from Last 3 Encounters:  11/25/19 285 lb (129.3 kg)  11/10/19 280 lb (127 kg)  08/30/19 284 lb 6.4 oz (129 kg)     Objective:    Vital Signs:  Ht 5\' 11"  (1.803 m)   Wt 285 lb (129.3 kg)   BMI 39.75 kg/m    ASSESSMENT & PLAN:     1.  OSA - The patient is tolerating PAP therapy well without any problems. The PAP download was reviewed today and showed an AHI of 0.9/hr on 13 cm H2O with 70% compliance in using more than 4 hours nightly.  The patient has been using and benefiting from PAP use and will continue to benefit from therapy.   2.  HTN -BP controlled on exam -continue Toprol XL 25mg  daily and Ramipril 10mg  daily  3.  Morbid Obesity -I have encouraged him to get into a routine exercise program and cut back on carbs and portions.   COVID-19 Education: The signs and symptoms of COVID-19 were discussed with the patient and how to seek care for testing (follow up with PCP or arrange E-visit).  The importance of social distancing was discussed today.  Patient Risk:   After full review of this patient's clinical status, I feel that they are at least moderate risk at this time.  Time:   Today, I have spent 15 minutes on telemedicine discussing medical problems including OSA, HTN, obesity and reviewing patient's chart including PAP compliance download.  Medication Adjustments/Labs and Tests Ordered: Current medicines are reviewed at length with the patient today.  Concerns regarding medicines are outlined above.  Tests Ordered: No orders of the defined types were placed in this encounter.  Medication Changes: No orders of the defined types were placed in this encounter.   Disposition:  Follow up in 6 months  Signed, Fransico Him, MD  11/25/2019 11:04 AM    [place]

## 2019-12-09 ENCOUNTER — Other Ambulatory Visit: Payer: Self-pay

## 2019-12-09 ENCOUNTER — Ambulatory Visit (HOSPITAL_COMMUNITY)
Admission: RE | Admit: 2019-12-09 | Discharge: 2019-12-09 | Disposition: A | Discharge status: Home / Self Care | Payer: PRIVATE HEALTH INSURANCE | Source: Ambulatory Visit | Attending: Internal Medicine | Admitting: Internal Medicine

## 2019-12-09 DIAGNOSIS — J986 Disorders of diaphragm: Secondary | ICD-10-CM

## 2019-12-13 MED FILL — HUMULIN R 500 UNITS/ML VIAL: 500 | 23 days supply | Qty: 20 | Fill #3

## 2019-12-14 MED FILL — HYDROCODON-APAP 10-325: 10-325 | 30 days supply | Qty: 120 | Fill #0

## 2019-12-22 MED FILL — PREGABALIN 50 MG CAPS: 50 | 30 days supply | Qty: 60 | Fill #0

## 2019-12-22 NOTE — Progress Notes (Signed)
Cardiology Office Note:    Date:  12/23/2019   ID:  Alec [name], DOB Nov 09, 1960, MRN [id number]  PCP:  Reynold Bowen, MD  Cardiologist:  [name], MD   Referring MD: Reynold Bowen, MD   Chief Complaint  Patient presents with  . Coronary Artery Disease  . Hypertension  . Hyperlipidemia  . Advice Only    Ed    History of Present Illness:    Alec [name] is a 59 y.o. male with a hx of CAD, prior unstable angina, RCA DES 2015, hyperlipidemia, OSA, prediabetes, erectile dysfunction, and type 2 DM.  He had an episode of left chest and arm pain 1 month ago.  Lasted approximately 15 minutes and then resolved.  Has not recurred since that time.  He did not have nitroglycerin to use.  We sent him a prescription.  He has not needed to use nitroglycerin.  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  He is on his feet all day.  He is not limited by shortness of breath or chest pain  He is compliant with his medications and CPAP.  He is having difficulty with erectile dysfunction and would like to be on a medication to help with this problem.  Past Medical History:  Diagnosis Date  . Arthritis   . CAD (coronary artery disease)    a. 04/27/14 DES to dRCA; widely patent L coronary system b. cath 08/13/2015 no changes, patent RCA  . Chronic lower back pain   . Depression   . Gout   . History of echocardiogram    a. 04/27/14 with EF 65-70% and mild TR.  Marland Kitchen History of pancreatitis   . Metabolic syndrome   . Mixed hyperlipidemia   . Obesity (BMI 30-39.9) 02/16/2016  . OSA (obstructive sleep apnea) 11/26/2015   Severe with total AHI 104.8/hr and oxygen desaturations as low as 79%.  On CPAP at 13cm H2O  . Type 2 diabetes mellitus (HCC)    on Insulin pump    Past Surgical History:  Procedure Laterality Date  . CARDIAC CATHETERIZATION N/A 08/14/2015   Procedure: Left Heart Cath and Coronary Angiography;  Surgeon: Belva Crome, MD;  Location: [facility];  Service: Cardiovascular;  Laterality: N/A;  . CARPAL TUNNEL RELEASE Bilateral 2006-2008   left 12/2004; right 10/2006  . CHONDROPLASTY  04/20/2015   Procedure: CHONDROPLASTY;  Surgeon: Melrose Nakayama, MD;  Location: [place];  Service: Orthopedics;;  . FUNCTIONAL ENDOSCOPIC SINUS SURGERY  10/2001; 08/2005;   . KNEE ARTHROSCOPY Bilateral 2000's   right 08/2003;   . KNEE ARTHROSCOPY WITH MEDIAL MENISECTOMY Left 04/20/2015   Procedure: LEFT ARTHROSCOPY KNEE WITH MEDIAL MENISECTOMY;  Surgeon: Melrose Nakayama, MD;  Location: [place];  Service: Orthopedics;  Laterality: Left;  Partial medial menisectomy, chondroplasty.  Marland Kitchen LEFT HEART CATHETERIZATION WITH CORONARY ANGIOGRAM N/A 04/27/2014   Procedure: LEFT HEART CATHETERIZATION WITH CORONARY ANGIOGRAM;  Surgeon: [name], MD;  [facility]: [facility];  Service: Cardiovascular;  Laterality: N/A;  . LUMBAR LAMINECTOMY Left 05/2006  . PERCUTANEOUS STENT INTERVENTION  04/27/2014   Procedure: PERCUTANEOUS STENT INTERVENTION;  Surgeon: [name], MD;  Location: [facility] CATH LAB;  Service: Cardiovascular;;  DES x2 (distal +prox RCA)  . POSTERIOR LUMBAR FUSION  07/2009  . SHOULDER ARTHROSCOPY Right ~ 2010    Current Medications: Current Meds  Medication Sig  . Alirocumab (PRALUENT Gantt) Inject into the skin as directed.  Marland Kitchen  allopurinol (ZYLOPRIM) 300 MG tablet Take 1 tablet (300 mg total) by mouth daily.  Marland Kitchen aspirin EC 81 MG tablet Take 81 mg by mouth daily.  Marland Kitchen atorvastatin (LIPITOR) 80 MG tablet Take 80 mg by mouth at bedtime.  . cetirizine (ZYRTEC) 10 MG tablet Take 10 mg by mouth daily.  . Empagliflozin (JARDIANCE) 25 MG TABS Take 25 mg by mouth daily.  Marland Kitchen escitalopram (LEXAPRO) 20 MG tablet TAKE 1 TABLET (20 MG) BY MOUTH DAILY.  . fenofibrate 160 MG tablet TAKE 1 TABLET BY MOUTH ONCE DAILY *PLEASE MAKE ANNUAL APPOINTMENT WITH DR. Tamala Julian FOR REFILLS*  . HYDROcodone-acetaminophen (NORCO) 5-325 MG tablet Take 1-2  tablets by mouth every 4 (four) hours as needed for moderate pain.  Marland Kitchen insulin regular human CONCENTRATED (HUMULIN R) 500 UNIT/ML SOLN injection Inject 500 Units into the skin 4 (four) times daily. 1.95 units per hour, bolus as needed blood suger  . metoprolol succinate (TOPROL-XL) 25 MG 24 hr tablet Take 25 mg by mouth daily.  . montelukast (SINGULAIR) 10 MG tablet Take 10 mg by mouth as needed.   . naproxen sodium (ANAPROX) 220 MG tablet Take 440 mg by mouth daily as needed (pain).  Marland Kitchen NITROSTAT 0.4 MG SL tablet PLACE 1 TABLET (0.4 MG TOTAL) UNDER THE TONGUE EVERY 5 (FIVE) MINUTES X 3 DOSES AS NEEDED FOR CHEST PAIN. DIAL 911 AFTER 3 DOSES  . ONE TOUCH ULTRA TEST test strip 1 each by Other route as needed for other (blood sugar).   . pregabalin (LYRICA) 50 MG capsule Take 50 mg by mouth 2 (two) times daily.  . ramipril (ALTACE) 10 MG capsule Take 10 mg by mouth daily.   . valACYclovir (VALTREX) 1000 MG tablet Take 1,000 mg by mouth as needed.   Marland Kitchen VASCEPA 1 G CAPS Take 2 g by mouth 2 (two) times daily.      Allergies:   Percocet [oxycodone-acetaminophen]   Social History   Socioeconomic History  . Marital status: Married    Spouse name: Not on file  . Number of children: 2  . Years of education: Not on file  . Highest education level: Not on file  Occupational History  . Occupation: Actuary  Tobacco Use  . Smoking status: Passive Smoke Exposure - Never Smoker  . Smokeless tobacco: Never Used  . Tobacco comment: tried for less than a year when 16  Substance and Sexual Activity  . Alcohol use: Yes    Comment: occ  . Drug use: No  . Sexual activity: Not on file  Other Topics Concern  . Not on file  Social History Narrative  . Not on file   Social Determinants of Health   Financial Resource Strain:   . Difficulty of Paying Living Expenses:   Food Insecurity:   . Worried About Charity fundraiser in the Last Year:   . Arboriculturist in the Last Year:   Transportation  Needs:   . Film/video editor (Medical):   Marland Kitchen Lack of Transportation (Non-Medical):   Physical Activity:   . Days of Exercise per Week:   . Minutes of Exercise per Session:   Stress:   . Feeling of Stress :   Social Connections:   . Frequency of Communication with Friends and Family:   . Frequency of Social Gatherings with Friends and Family:   . Attends Religious Services:   . Active Member of Clubs or Organizations:   . Attends Archivist Meetings:   .  Marital Status:      Family History: The patient's family history includes Asthma in his son; Coronary artery disease in his mother; Diabetes in his father and mother; Emphysema in his mother; Heart attack in his mother; Lung cancer in his father; Prostate cancer in his father. There is no history of Colon cancer.  ROS:   Please see the history of present illness.    He sleeps up to 6 hours per night.  He had COVID-19 in January.  He had to receive an infusion of monoclonal antibodies.  This made a dramatic improvement.  All other systems reviewed and are negative.  EKGs/Labs/Other Studies Reviewed:    The following studies were reviewed today: No new or recent imaging.  EKG:  EKG when compared to the tracing from April 2018 no changes noted.  The EKG demonstrates normal sinus rhythm, evidence of prior inferior infarction, tall R wave in V2.  Recent Labs: No results found for requested labs within last 8760 hours.  Recent Lipid Panel    Component Value Date/Time   CHOL 349 (H) 10/13/2015 0858   TRIG 2,598 (H) 10/13/2015 0858   HDL 27 (L) 10/13/2015 0858   CHOLHDL 12.9 (H) 10/13/2015 0858   VLDL NOT CALC 10/13/2015 0858   LDLCALC NOT CALC 10/13/2015 0858   LDLDIRECT 65 10/13/2015 0858    Physical Exam:    VS:  BP 138/72   Pulse 87   Ht 5\' 11"  (1.803 m)   Wt 284 lb (128.8 kg)   SpO2 95%   BMI 39.61 kg/m     Wt Readings from Last 3 Encounters:  12/23/19 284 lb (128.8 kg)  11/25/19 285 lb (129.3 kg)    11/10/19 280 lb (127 kg)     GEN: Obesity. No acute distress HEENT: Normal NECK: No JVD. LYMPHATICS: No lymphadenopathy CARDIAC:  RRR without murmur, gallop, or edema. VASCULAR:  Normal Pulses. No bruits. RESPIRATORY:  Clear to auscultation without rales, wheezing or rhonchi  ABDOMEN: Soft, non-tender, non-distended, No pulsatile mass, MUSCULOSKELETAL: No deformity  SKIN: Warm and dry NEUROLOGIC:  Alert and oriented x 3 PSYCHIATRIC:  Normal affect   ASSESSMENT:    1. Coronary artery disease involving native coronary artery of native heart without angina pectoris   2. Essential hypertension   3. OSA (obstructive sleep apnea)   4. Morbid obesity ([place])   5. Erectile dysfunction, unspecified erectile dysfunction type   6. Hypercholesterolemia   7. Educated about COVID-19 virus infection    PLAN:    In order of problems listed above:  1. Secondary prevention was discussed.  Aerobic activity encouraged. 2. Target blood pressure 130/80 mmHg. 3. Compliant with CPAP. 4. Needs to lose weight.  Decrease caloric intake. 5. Viagra 100 mg tablets, total 10.  Use one quarter, one half, or the whole tablet depending upon the impact noted.  Recommended that he start with 1/2 tablet.  With nitroglycerin use are given.  24-hour washout after sildenafil before using sublingual nitroglycerin. 6. LDL cholesterol less than 70.  Continue both Vascepa, fenofibrate, and high intensity statin (Lipitor. 7. Had COVID-19.  Received monoclonal antibodies.  Looking forward to getting vaccinated within the next 2 weeks.  Still practicing social distancing.  Overall education and awareness concerning primary/secondary risk prevention was discussed in detail: LDL less than 70, hemoglobin A1c less than 7, blood pressure target less than 130/80 mmHg, >150 minutes of moderate aerobic activity per week, avoidance of smoking, weight control (via diet and exercise), and continued surveillance/management  of/for  obstructive sleep apnea.    Medication Adjustments/Labs and Tests Ordered: Current medicines are reviewed at length with the patient today.  Concerns regarding medicines are outlined above.  Orders Placed This Encounter  Procedures  . EKG 12-Lead   Meds ordered this encounter  Medications  . sildenafil (VIAGRA) 100 MG tablet    Sig: Take 1 tablet (100 mg total) by mouth daily as needed for erectile dysfunction.    Dispense:  10 tablet    Refill:  0    Future refills need to come from PCP.  Pt educated on not using Nitro if taking Viagra.    Patient Instructions  Medication Instructions:  1) We have sent in a prescription of Viagra 100mg  that you can use once daily.  Start with a quarter tablet and you can move up to a half or a whole depending on response to each dose.  *If you need a refill on your cardiac medications before your next appointment, please call your pharmacy*   Lab Work: None If you have labs (blood work) drawn today and your tests are completely normal, you will receive your results only by: Marland Kitchen MyChart Message (if you have MyChart) OR . A paper copy in the mail If you have any lab test that is abnormal or we need to change your treatment, we will call you to review the results.   Testing/Procedures: None   Follow-Up: At [facility], you and your health needs are our priority.  As part of our continuing mission to provide you with exceptional heart care, we have created designated Provider Care Teams.  These Care Teams include your primary Cardiologist (physician) and Advanced Practice Providers (APPs -  Physician Assistants and Nurse Practitioners) who all work together to provide you with the care you need, when you need it.  We recommend signing up for the patient portal called "MyChart".  Sign up information is provided on this After Visit Summary.  MyChart is used to connect with patients for Virtual Visits (Telemedicine).  Patients are able to view  lab/test results, encounter notes, upcoming appointments, etc.  Non-urgent messages can be sent to your provider as well.   To learn more about what you can do with MyChart, go to [email or web address].    Your next appointment:   12 month(s)  The format for your next appointment:   In Person  Provider:   You may see [name], MD or one of the following Advanced Practice Providers on your designated Care Team:    Truitt Merle, NP  Cecilie Kicks, NP  Kathyrn Drown, NP    Other Instructions      Signed, [name], MD  12/23/2019 5:13 PM    [place]

## 2019-12-23 ENCOUNTER — Encounter: Payer: Self-pay | Admitting: Interventional Cardiology

## 2019-12-23 ENCOUNTER — Ambulatory Visit (INDEPENDENT_AMBULATORY_CARE_PROVIDER_SITE_OTHER): Payer: PRIVATE HEALTH INSURANCE | Admitting: Interventional Cardiology

## 2019-12-23 ENCOUNTER — Other Ambulatory Visit: Payer: Self-pay

## 2019-12-23 VITALS — BP 138/72 | HR 87 | Ht 71.0 in | Wt 284.0 lb

## 2019-12-23 DIAGNOSIS — I10 Essential (primary) hypertension: Secondary | ICD-10-CM | POA: Diagnosis not present

## 2019-12-23 DIAGNOSIS — N529 Male erectile dysfunction, unspecified: Secondary | ICD-10-CM

## 2019-12-23 DIAGNOSIS — E7800 Pure hypercholesterolemia, unspecified: Secondary | ICD-10-CM

## 2019-12-23 DIAGNOSIS — Z7189 Other specified counseling: Secondary | ICD-10-CM

## 2019-12-23 DIAGNOSIS — I2510 Atherosclerotic heart disease of native coronary artery without angina pectoris: Secondary | ICD-10-CM

## 2019-12-23 DIAGNOSIS — G4733 Obstructive sleep apnea (adult) (pediatric): Secondary | ICD-10-CM

## 2019-12-23 MED ORDER — SILDENAFIL CITRATE 100 MG PO TABS: 100.0000 mg | ORAL_TABLET | Freq: Every day | ORAL | 0 refills | Status: DC | PRN

## 2019-12-23 MED FILL — SILDENAFIL CITRATE 100 MG T: 100 | 10 days supply | Qty: 10 | Fill #0

## 2019-12-23 NOTE — Patient Instructions (Signed)
Medication Instructions:  1) We have sent in a prescription of Viagra 100mg  that you can use once daily.  Start with a quarter tablet and you can move up to a half or a whole depending on response to each dose.  *If you need a refill on your cardiac medications before your next appointment, please call your pharmacy*   Lab Work: None If you have labs (blood work) drawn today and your tests are completely normal, you will receive your results only by: Marland Kitchen MyChart Message (if you have MyChart) OR . A paper copy in the mail If you have any lab test that is abnormal or we need to change your treatment, we will call you to review the results.   Testing/Procedures: None   Follow-Up: At [facility], you and your health needs are our priority.  As part of our continuing mission to provide you with exceptional heart care, we have created designated Provider Care Teams.  These Care Teams include your primary Cardiologist (physician) and Advanced Practice Providers (APPs -  Physician Assistants and Nurse Practitioners) who all work together to provide you with the care you need, when you need it.  We recommend signing up for the patient portal called "MyChart".  Sign up information is provided on this After Visit Summary.  MyChart is used to connect with patients for Virtual Visits (Telemedicine).  Patients are able to view lab/test results, encounter notes, upcoming appointments, etc.  Non-urgent messages can be sent to your provider as well.   To learn more about what you can do with MyChart, go to [email or web address].    Your next appointment:   12 month(s)  The format for your next appointment:   In Person  Provider:   You may see [name], MD or one of the following Advanced Practice Providers on your designated Care Team:    Truitt Merle, NP  Cecilie Kicks, NP  Kathyrn Drown, NP    Other Instructions

## 2019-12-28 ENCOUNTER — Other Ambulatory Visit: Payer: Self-pay | Admitting: Interventional Cardiology

## 2019-12-28 MED FILL — VASCEPA 1 GM CAPSULE: 1 | 30 days supply | Qty: 120 | Fill #2

## 2019-12-28 MED FILL — METOPROLOL SUCCINATE ER 25: 25 | 30 days supply | Qty: 30 | Fill #4

## 2019-12-28 MED FILL — ALLOPURINOL 300 MG TABLET: 300 | 30 days supply | Qty: 30 | Fill #3

## 2019-12-28 MED FILL — JARDIANCE 25 MG TABLET: 25 | 30 days supply | Qty: 30 | Fill #1

## 2019-12-29 ENCOUNTER — Other Ambulatory Visit: Payer: Self-pay | Admitting: Interventional Cardiology

## 2019-12-29 MED FILL — FENOFIBRATE 160 MG TABLET: 160 | 90 days supply | Qty: 90 | Fill #0

## 2019-12-31 ENCOUNTER — Other Ambulatory Visit (HOSPITAL_COMMUNITY)
Admission: RE | Admit: 2019-12-31 | Discharge: 2019-12-31 | Disposition: A | Discharge status: Home / Self Care | Payer: PRIVATE HEALTH INSURANCE | Source: Ambulatory Visit | Attending: Adult Health | Admitting: Adult Health

## 2019-12-31 DIAGNOSIS — Z20822 Contact with and (suspected) exposure to covid-19: Secondary | ICD-10-CM | POA: Insufficient documentation

## 2019-12-31 DIAGNOSIS — Z01812 Encounter for preprocedural laboratory examination: Secondary | ICD-10-CM | POA: Insufficient documentation

## 2019-12-31 LAB — SARS CORONAVIRUS 2 (TAT 6-24 HRS): SARS Coronavirus 2: NEGATIVE

## 2020-01-03 ENCOUNTER — Other Ambulatory Visit: Payer: Self-pay

## 2020-01-03 ENCOUNTER — Ambulatory Visit (INDEPENDENT_AMBULATORY_CARE_PROVIDER_SITE_OTHER): Payer: PRIVATE HEALTH INSURANCE | Admitting: Internal Medicine

## 2020-01-03 DIAGNOSIS — R0602 Shortness of breath: Secondary | ICD-10-CM

## 2020-01-03 LAB — PULMONARY FUNCTION TEST
DL/VA % pred: 115 %
DL/VA: 4.94 ml/min/mmHg/L
DLCO cor % pred: 100 %
DLCO cor: 29.26 ml/min/mmHg
DLCO unc % pred: 100 %
DLCO unc: 29.26 ml/min/mmHg
FEF 25-75 Post: 3.22 L/sec
FEF 25-75 Pre: 2.4 L/sec
FEF2575-%Change-Post: 33 %
FEF2575-%Pred-Post: 100 %
FEF2575-%Pred-Pre: 74 %
FEV1-%Change-Post: 8 %
FEV1-%Pred-Post: 83 %
FEV1-%Pred-Pre: 76 %
FEV1-Post: 3.19 L
FEV1-Pre: 2.94 L
FEV1FVC-%Change-Post: 4 %
FEV1FVC-%Pred-Pre: 100 %
FEV6-%Change-Post: 3 %
FEV6-%Pred-Post: 81 %
FEV6-%Pred-Pre: 78 %
FEV6-Post: 3.92 L
FEV6-Pre: 3.79 L
FEV6FVC-%Change-Post: 0 %
FEV6FVC-%Pred-Post: 104 %
FEV6FVC-%Pred-Pre: 103 %
FVC-%Change-Post: 3 %
FVC-%Pred-Post: 78 %
FVC-%Pred-Pre: 75 %
FVC-Post: 3.95 L
FVC-Pre: 3.82 L
Post FEV1/FVC ratio: 81 %
Post FEV6/FVC ratio: 100 %
Pre FEV1/FVC ratio: 77 %
Pre FEV6/FVC Ratio: 99 %
RV % pred: 104 %
RV: 2.34 L
TLC % pred: 86 %
TLC: 6.23 L

## 2020-01-03 MED FILL — HUMULIN R 500 UNITS/ML VIAL: 500 | 23 days supply | Qty: 20 | Fill #4

## 2020-01-03 NOTE — Progress Notes (Signed)
PFT done today. 

## 2020-01-14 MED FILL — HYDROCODON-APAP 10-325: 10-325 | 30 days supply | Qty: 120 | Fill #0

## 2020-01-24 ENCOUNTER — Telehealth (INDEPENDENT_AMBULATORY_CARE_PROVIDER_SITE_OTHER): Payer: PRIVATE HEALTH INSURANCE | Admitting: Internal Medicine

## 2020-01-24 ENCOUNTER — Encounter: Payer: Self-pay | Admitting: Internal Medicine

## 2020-01-24 ENCOUNTER — Telehealth: Payer: PRIVATE HEALTH INSURANCE | Admitting: Internal Medicine

## 2020-01-24 DIAGNOSIS — R0602 Shortness of breath: Secondary | ICD-10-CM

## 2020-01-24 NOTE — Progress Notes (Addendum)
Alec [name]    [id number]    Jan 22, 1961  Primary Care Physician:[place], Annie Main, MD Date of Appointment: 01/24/2020 Established Patient Visit  Chief complaint:   Chief Complaint  Patient presents with  . Follow-up   I connected with  Alec [name] on 01/24/20 by a video enabled telemedicine application and verified that I am speaking with the correct person using two identifiers.   I discussed the limitations of evaluation and management by telemedicine. The patient expressed understanding and agreed to proceed.   HPI: 59 year old gentleman who presented initially to see me late in 2020 for dyspnea on exertion.  Was found to have elevated right hemidiaphragm.  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  Sniff test and PFTs were ordered.  Interval Updates: No significant change in breathing since last visit. Feeling better since covid infection. Back to work. He is using CPAP regularly. He has had his sniff test and PFTs.   I have reviewed the patient's family social and past medical history and updated as appropriate.   Past Medical History:  Diagnosis Date  . Arthritis   . CAD (coronary artery disease)    a. 04/27/14 DES to dRCA; widely patent L coronary system b. cath 08/13/2015 no changes, patent RCA  . Chronic lower back pain   . Depression   . Gout   . History of echocardiogram    a. 04/27/14 with EF 65-70% and mild TR.  Marland Kitchen History of pancreatitis   . Metabolic syndrome   . Mixed hyperlipidemia   . Obesity (BMI 30-39.9) 02/16/2016  . OSA (obstructive sleep apnea) 11/26/2015   Severe with total AHI 104.8/hr and oxygen desaturations as low as 79%.  On CPAP at 13cm H2O  . Type 2 diabetes mellitus (HCC)    on Insulin pump    Past Surgical History:  Procedure Laterality Date  . CARDIAC CATHETERIZATION N/A 08/14/2015   Procedure: Left Heart Cath and Coronary Angiography;   Surgeon: Alec Crome, MD;  Location: [place] [facility];  Service: Cardiovascular;  Laterality: N/A;  . CARPAL TUNNEL RELEASE Bilateral 2006-2008   left 12/2004; right 10/2006  . CHONDROPLASTY  04/20/2015   Procedure: CHONDROPLASTY;  Surgeon: Alec Nakayama, MD;  Location: [place];  Service: Orthopedics;;  . FUNCTIONAL ENDOSCOPIC SINUS SURGERY  10/2001; 08/2005;   . KNEE ARTHROSCOPY Bilateral 2000's   right 08/2003;   . KNEE ARTHROSCOPY WITH MEDIAL MENISECTOMY Left 04/20/2015   Procedure: LEFT ARTHROSCOPY KNEE WITH MEDIAL MENISECTOMY;  Surgeon: Alec Nakayama, MD;  Location: [place];  Service: Orthopedics;  Laterality: Left;  Partial medial menisectomy, chondroplasty.  Marland Kitchen LEFT HEART CATHETERIZATION WITH CORONARY ANGIOGRAM N/A 04/27/2014   Procedure: LEFT HEART CATHETERIZATION WITH CORONARY ANGIOGRAM;  Surgeon: Alec Grooms, MD;  Location: [facility] CATH LAB;  Service: Cardiovascular;  Laterality: N/A;  . LUMBAR LAMINECTOMY Left 05/2006  . PERCUTANEOUS STENT INTERVENTION  04/27/2014   Procedure: PERCUTANEOUS STENT INTERVENTION;  Surgeon: Alec Grooms, MD;  Location[facility] LAB;  Service: Cardiovascular;;  DES x2 (distal +prox RCA)  . POSTERIOR LUMBAR FUSION  07/2009  . SHOULDER ARTHROSCOPY Right ~ 2010    Family History  Problem Relation Age of Onset  . Coronary artery disease Mother        MI in her 82's  . Heart attack Mother   . Diabetes  Mother   . Emphysema Mother   . Prostate cancer Father   . Lung cancer Father   . Diabetes Father   . Asthma Son   . Colon cancer Neg Hx     Social History   Occupational History  . Occupation: Actuary  Tobacco Use  . Smoking status: Passive Smoke Exposure - Never Smoker  . Smokeless tobacco: Never Used  . Tobacco comment: tried for less than a year when 16  Substance and Sexual Activity  . Alcohol use: Yes    Comment: occ  . Drug use: No  . Sexual activity: Not on file    Review of  systems: Constitutional: No fevers, chills, night sweats, or weight loss. CV: No chest pain, or palpitations. Resp: No hemoptysis.  Physical Exam: There were no vitals taken for this visit. - due to nature of video visit.   Gen:      No acute distress Lungs:    No increased respiratory effort, no audible wheezing  Data Reviewed: Imaging: I have personally reviewed the c chest x-rays from 2003, and compared them to the ones in 2007 and present day.  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  Normal Sniff test March 2021  PFTs: mild restriction to ventilation, likely secondary to body habitus.   Labs: None available  Immunization status: Immunization History  Administered Date(s) Administered  . Influenza Whole 06/07/2019  . Pneumococcal Polysaccharide-23 08/27/2010    Assessment:  Shortness of breath - likely secondary mild restriction to ventilation from body habitus.  Elevated right hemidiaphragm - normal sniff test History of Covid 19 infection in Jan 2021 OSA on CPAP - download reviewed.   Plan/Recommendations: Continue CPAP He has followed up with Alec Tamala Julian regarding cardiac causes of his dyspnea including CAD Discussed a trial of weight loss and regular exercise to help with his dyspnea.   I spent 32 minutes on 01/24/2020 in care of this patient including face to face time and non-face to face time spent charting, review of outside records, and coordination of care.   Return to Care: Return in about 6 months (around 07/26/2020). We will send him an appointment recall to follow up in 6 months.   Alec Llamas, MD Pulmonary and [place]

## 2020-01-24 NOTE — Addendum Note (Signed)
Addended by: [name] on: 01/24/2020 01:56 PM   Modules accepted: Level of Service

## 2020-02-17 MED FILL — HYDROCODON-APAP 10-325: 10-325 | 30 days supply | Qty: 120 | Fill #0

## 2020-02-17 MED FILL — HUMULIN R 500 UNITS/ML VIAL: 500 | 23 days supply | Qty: 20 | Fill #5

## 2020-03-02 MED FILL — PREGABALIN 50 MG CAPS: 50 | 30 days supply | Qty: 60 | Fill #2

## 2020-03-02 MED FILL — JARDIANCE 25 MG TABLET: 25 | 30 days supply | Qty: 30 | Fill #3

## 2020-03-02 MED FILL — valACYclovir HCL 1 GM TABS: 1 | 8 days supply | Qty: 30 | Fill #1

## 2020-03-02 MED FILL — VASCEPA 1 GM CAPSULE: 1 | 30 days supply | Qty: 120 | Fill #4

## 2020-03-02 MED FILL — ALLOPURINOL 300 MG TABLET: 300 | 30 days supply | Qty: 30 | Fill #5

## 2020-03-03 ENCOUNTER — Other Ambulatory Visit (HOSPITAL_BASED_OUTPATIENT_CLINIC_OR_DEPARTMENT_OTHER): Payer: Self-pay | Admitting: Endocrinology

## 2020-03-03 MED FILL — METOPROLOL SUCCINATE ER 25: 25 | 30 days supply | Qty: 30 | Fill #0

## 2020-03-06 MED FILL — HUMULIN R 500 UNITS/ML VIAL: 500 | 23 days supply | Qty: 20 | Fill #6

## 2020-03-16 MED FILL — HYDROCODON-APAP 10-325: 10-325 | 30 days supply | Qty: 120 | Fill #0

## 2020-04-13 MED FILL — ATORVASTATIN 80 MG TABLET: 80 | 90 days supply | Qty: 90 | Fill #1

## 2020-04-13 MED FILL — PREGABALIN 50 MG CAPS: 50 | 30 days supply | Qty: 60 | Fill #3

## 2020-04-13 MED FILL — METOPROLOL SUCCINATE ER 25: 25 | 30 days supply | Qty: 30 | Fill #1

## 2020-04-13 MED FILL — ESCITALOPRAM 20 MG TABLET: 20 | 90 days supply | Qty: 90 | Fill #1

## 2020-04-13 MED FILL — FENOFIBRATE 160 MG TABLET: 160 | 90 days supply | Qty: 90 | Fill #1

## 2020-04-13 MED FILL — VASCEPA 1 GM CAPSULE: 1 | 30 days supply | Qty: 120 | Fill #5

## 2020-04-13 MED FILL — JARDIANCE 25 MG TABLET: 25 | 30 days supply | Qty: 30 | Fill #4

## 2020-04-13 MED FILL — MONTELUKAST SOD 10 MG TAB: 10 | 30 days supply | Qty: 30 | Fill #0

## 2020-04-13 MED FILL — ALLOPURINOL 300 MG TABLET: 300 | 30 days supply | Qty: 30 | Fill #6

## 2020-04-14 MED FILL — HUMULIN R 500 UNITS/ML VIAL: 500 | 23 days supply | Qty: 20 | Fill #7

## 2020-04-14 MED FILL — HYDROCODON-APAP 10-325: 10-325 | 30 days supply | Qty: 120 | Fill #0

## 2020-05-16 ENCOUNTER — Other Ambulatory Visit (HOSPITAL_BASED_OUTPATIENT_CLINIC_OR_DEPARTMENT_OTHER): Payer: Self-pay | Admitting: Endocrinology

## 2020-05-16 MED FILL — ALLOPURINOL 300 MG TABLET: 300 | 30 days supply | Qty: 30 | Fill #0

## 2020-05-16 MED FILL — METOPROLOL SUCCINATE ER 25: 25 | 30 days supply | Qty: 30 | Fill #2

## 2020-05-16 MED FILL — JARDIANCE 25 MG TABLET: 25 | 30 days supply | Qty: 30 | Fill #5

## 2020-05-16 MED FILL — MONTELUKAST SOD 10 MG TAB: 10 | 30 days supply | Qty: 30 | Fill #0

## 2020-05-16 MED FILL — HYDROCODON-APAP 10-325: 10-325 | 30 days supply | Qty: 120 | Fill #0

## 2020-05-16 MED FILL — HUMULIN R 500 UNITS/ML VIAL: 500 | 23 days supply | Qty: 20 | Fill #8

## 2020-05-16 MED FILL — VASCEPA 1 GM CAPSULE: 1 | 30 days supply | Qty: 120 | Fill #0

## 2020-05-17 MED FILL — PREGABALIN 50 MG CAPS: 50 | 30 days supply | Qty: 60 | Fill #0

## 2020-05-30 MED FILL — RAMIPRIL 10 MG CAPSULE: 10 | 90 days supply | Qty: 90 | Fill #1

## 2020-06-06 ENCOUNTER — Other Ambulatory Visit: Payer: Self-pay | Admitting: Orthopedic Surgery

## 2020-06-07 ENCOUNTER — Other Ambulatory Visit: Payer: Self-pay | Admitting: Orthopedic Surgery

## 2020-06-07 ENCOUNTER — Telehealth: Payer: Self-pay | Admitting: *Deleted

## 2020-06-07 NOTE — Telephone Encounter (Signed)
   [facility] Pre-operative Risk Assessment    HEARTCARE STAFF: - Please ensure there is not already an duplicate clearance open for this procedure. - Under Visit Info/Reason for Call, type in Other and utilize the format Clearance MM/DD/YY or Clearance TBD. Do not use dashes or single digits. - If request is for dental extraction, please clarify the # of teeth to be extracted.  Request for surgical clearance:  1. What type of surgery is being performed? RIGHT-SIDED LUMBAR 3-4 LATERAL INTERBODY FUSION W/INSTRUMENTATION and ALLOGRAFT  2. When is this surgery scheduled? 08/03/20  3. What type of clearance is required (medical clearance vs. Pharmacy clearance to hold med vs. Both)? MEDICAL  4. Are there any medications that need to be held prior to surgery and how long? ASA   5. Practice name and name of physician performing surgery? [facility]; DR. [place]  6. What is the office phone number? [phone]   7.   What is the office fax number? [place]  8.   Anesthesia type (None, local, MAC, general) ? NOT LISTED. GENERAL, SPINAL?    Alec [name] 06/07/2020, 1:41 PM  _________________________________________________________________   (provider comments below)

## 2020-06-07 NOTE — Telephone Encounter (Signed)
Left voice mail to call back 

## 2020-06-12 ENCOUNTER — Other Ambulatory Visit: Payer: Self-pay | Admitting: Physician Assistant

## 2020-06-12 DIAGNOSIS — M542 Cervicalgia: Secondary | ICD-10-CM

## 2020-06-13 NOTE — Telephone Encounter (Signed)
Patient is returning call.  °

## 2020-06-13 NOTE — Telephone Encounter (Signed)
   Primary Cardiologist: [name], MD  Chart reviewed as part of pre-operative protocol coverage. Patient was contacted 06/13/2020 in reference to pre-operative risk assessment for pending surgery as outlined below.  Alec [name] was last seen on 12/23/19 by Dr. Tamala Julian.  Since that day, Alec [name] has done well.   Therefore, based on ACC/AHA guidelines, the patient would be at acceptable risk for the planned procedure without further cardiovascular testing.   Okay to hold ASA 5-7 prior to surgery.   The patient was advised that if he develops new symptoms prior to surgery to contact our office to arrange for a follow-up visit, and he verbalized understanding.  I will route this recommendation to the requesting party via Epic fax function and remove from pre-op pool. Please call with questions.  [place], [place] 06/13/2020, 2:19 PM

## 2020-06-13 NOTE — Telephone Encounter (Signed)
Left message for the patient to call back and speak to the preop APP. 

## 2020-06-15 MED FILL — HYDROCODON-APAP 10-325: 10-325 | 30 days supply | Qty: 120 | Fill #0

## 2020-06-16 MED FILL — METOPROLOL SUCCINATE ER 25: 25 | 30 days supply | Qty: 30 | Fill #3

## 2020-06-16 MED FILL — PREGABALIN 50 MG CAPS: 50 | 30 days supply | Qty: 60 | Fill #1

## 2020-06-16 MED FILL — VASCEPA 1 GM CAPSULE: 1 | 30 days supply | Qty: 120 | Fill #1

## 2020-06-16 MED FILL — MONTELUKAST SOD 10 MG TAB: 10 | 30 days supply | Qty: 30 | Fill #1

## 2020-06-16 MED FILL — JARDIANCE 25 MG TABLET: 25 | 30 days supply | Qty: 30 | Fill #6

## 2020-06-16 MED FILL — ALLOPURINOL 300 MG TABLET: 300 | 30 days supply | Qty: 30 | Fill #1

## 2020-06-16 MED FILL — HUMULIN R 500 UNITS/ML VIAL: 500 | 23 days supply | Qty: 20 | Fill #9

## 2020-07-13 ENCOUNTER — Other Ambulatory Visit (HOSPITAL_BASED_OUTPATIENT_CLINIC_OR_DEPARTMENT_OTHER): Payer: Self-pay | Admitting: Endocrinology

## 2020-07-14 MED FILL — HYDROCODON-APAP 10-325: 10-325 | 30 days supply | Qty: 120 | Fill #0

## 2020-07-14 MED FILL — SILDENAFIL CITRATE 100 MG T: 100 | 5 days supply | Qty: 5 | Fill #0

## 2020-07-24 ENCOUNTER — Other Ambulatory Visit (HOSPITAL_BASED_OUTPATIENT_CLINIC_OR_DEPARTMENT_OTHER): Payer: Self-pay | Admitting: Endocrinology

## 2020-07-24 MED FILL — PREGABALIN 50 MG CAPS: 50 | 30 days supply | Qty: 60 | Fill #2

## 2020-07-24 MED FILL — MONTELUKAST SOD 10 MG TAB: 10 | 30 days supply | Qty: 30 | Fill #2

## 2020-07-24 MED FILL — ESCITALOPRAM 20 MG TABLET: 20 | 90 days supply | Qty: 90 | Fill #0

## 2020-07-24 MED FILL — ALLOPURINOL 300 MG TABLET: 300 | 30 days supply | Qty: 30 | Fill #2

## 2020-07-24 MED FILL — ATORVASTATIN 80 MG TABLET: 80 | 90 days supply | Qty: 90 | Fill #2

## 2020-07-24 MED FILL — VASCEPA 1 GM CAPSULE: 1 | 30 days supply | Qty: 120 | Fill #2

## 2020-07-24 MED FILL — FENOFIBRATE 160 MG TABLET: 160 | 90 days supply | Qty: 90 | Fill #2

## 2020-07-24 MED FILL — METOPROLOL SUCCINATE ER 25: 25 | 30 days supply | Qty: 30 | Fill #4

## 2020-07-28 MED FILL — HUMULIN R 500 UNITS/ML VIAL: 500 | 23 days supply | Qty: 20 | Fill #10

## 2020-07-31 NOTE — Progress Notes (Signed)
[place], [place] [address] Phone: [phone] Fax: [phone]  [id number] - [place], [address] [place] [address] Phone: [phone] Fax: [phone]      Your procedure is scheduled on Thursday 08/03/2020.  Report to [facility] Main Entrance "A" at 05:30 A.M., and check in at the Admitting office.  Call this number if you have problems the morning of surgery:  [phone]  Call [phone] if you have any questions prior to your surgery date Monday-Friday 8am-4pm    Remember:  Do not eat after midnight the night before your surgery  You may drink clear liquids until 04:30am the morning of your surgery.   Clear liquids allowed are: Water, Non-Citrus Juices (without pulp), Carbonated Beverages, Clear Tea, Black Coffee Only, and Gatorade   Enhanced Recovery after Surgery for Orthopedics Enhanced Recovery after Surgery is a protocol used to improve the stress on your body and your recovery after surgery.  Patient Instructions  . The night before surgery:  o No food after midnight. ONLY clear liquids after midnight    . The day of surgery (if you have diabetes): o Drink ONE (1) water by 04:30am the morning of surgery o This drink was given to you during your hospital  pre-op appointment visit.  o Nothing else to drink after completing the  water.         If you have questions, please contact your surgeon's office.     Take these medicines the morning of surgery with A SIP OF WATER: Allopurinol (Zyloprim) Cetirizine (Zyrtec) escitalopram (Lexapro) Fenofibrate  Metoprolol succinate (Toprol XL) Pregablin (Lyrica)  If needed you may take the following medications the morning of surgery: Hydrocodone-acetaminophen (Norco) Nitrostat Valacyclovir (Valtrex)  As of today, STOP taking any Vascepa,  Aspirin (unless otherwise instructed by your surgeon) Aleve, Naproxen, Ibuprofen, Motrin, Advil, Goody's, BC's, all herbal medications, fish oil, and all vitamins.   WHAT DO I DO ABOUT MY DIABETES MEDICATION?  Marland Kitchen Do not take oral diabetes medicines (pills) the morning of surgery.   Patients with Insulin Pumps    . For patients with Insulin Pumps: o Contact your diabetes doctor for specific instructions before surgery. o Decrease basal insulin rates by 20% at midnight the night before surgery. o Do not remove your insulin pump prior to arrival to short stay the morning of surgery.  Anesthesia will instruct you on when to remove your pump. o Note that if your surgery is planned to be longer than 2 hours, your insulin pump will be removed and intravenous (IV) insulin will be started and managed by the nurses and anesthesiologist. You will be able to restart your insulin pump once you are awake and able to manage it. o Make sure to bring insulin pump supplies to the hospital with you in case your site needs to be changed.  HOW TO MANAGE YOUR DIABETES BEFORE AND AFTER SURGERY  Why is it important to control my blood sugar before and after surgery? . Improving blood sugar levels before and after surgery helps healing and can limit problems. . A way of improving blood sugar control is eating a healthy diet by: o  Eating less sugar and carbohydrates o  Increasing activity/exercise o  Talking with your doctor about reaching your blood sugar goals . High blood sugars (greater than 180 mg/dL)  can raise your risk of infections and slow your recovery, so you will need to focus on controlling your diabetes during the weeks before surgery. . Make sure that the doctor who takes care of your diabetes knows about your planned surgery including the date and location.  How do I manage my blood sugar before surgery? . Check your blood sugar at least 4 times a day, starting 2 days before surgery, to make  sure that the level is not too high or low. . Check your blood sugar the morning of your surgery when you wake up and every 2 hours until you get to the Short Stay unit. o If your blood sugar is less than 70 mg/dL, you will need to treat for low blood sugar: - Do not take insulin. - Treat a low blood sugar (less than 70 mg/dL) with  cup of clear juice (cranberry or apple), 4 glucose tablets, OR glucose gel. - Recheck blood sugar in 15 minutes after treatment (to make sure it is greater than 70 mg/dL). If your blood sugar is not greater than 70 mg/dL on recheck, call [phone] for further instructions. . Report your blood sugar to the short stay nurse when you get to Short Stay.  . If you are admitted to the hospital after surgery: o Your blood sugar will be checked by the staff and you will probably be given insulin after surgery (instead of oral diabetes medicines) to make sure you have good blood sugar levels. o The goal for blood sugar control after surgery is 80-180 mg/dL.                      Do not wear jewelry            Do not wear lotions, powders, colognes, or deodorant.            Men may shave face and neck.            Do not bring valuables to the hospital.            [facility] is not responsible for any belongings or valuables.  Do NOT Smoke (Tobacco/Vaping) or drink Alcohol 24 hours prior to your procedure  If you use a CPAP at night, you may bring all equipment for your overnight stay.   Contacts, glasses, dentures or bridgework may not be worn into surgery.      For patients admitted to the hospital, discharge time will be determined by your treatment team.   Patients discharged the day of surgery will not be allowed to drive home, and someone needs to stay with them for 24 hours.    Special instructions:   [facility]- Preparing For Surgery  Before surgery, you can play an important role. Because skin is not sterile, your skin needs to be as free of germs as  possible. You can reduce the number of germs on your skin by washing with CHG (chlorahexidine gluconate) Soap before surgery.  CHG is an antiseptic cleaner which kills germs and bonds with the skin to continue killing germs even after washing.    Oral Hygiene is also important to reduce your risk of infection.  Remember - BRUSH YOUR TEETH THE MORNING OF SURGERY WITH YOUR REGULAR TOOTHPASTE  Please do not use if you have an allergy to CHG or antibacterial soaps. If your skin becomes reddened/irritated stop using the CHG.  Do not shave (including legs and underarms) for at least 48 hours  prior to first CHG shower. It is OK to shave your face.  Please follow these instructions carefully.   1. Shower the NIGHT BEFORE SURGERY and the MORNING OF SURGERY with CHG Soap.   2. If you chose to wash your hair, wash your hair first as usual with your normal shampoo.  3. After you shampoo, rinse your hair and body thoroughly to remove the shampoo.  4. Use CHG as you would any other liquid soap. You can apply CHG directly to the skin and wash gently with a scrungie or a clean washcloth.   5. Apply the CHG Soap to your body ONLY FROM THE NECK DOWN.  Do not use on open wounds or open sores. Avoid contact with your eyes, ears, mouth and genitals (private parts). Wash Face and genitals (private parts)  with your normal soap.   6. Wash thoroughly, paying special attention to the area where your surgery will be performed.  7. Thoroughly rinse your body with warm water from the neck down.  8. DO NOT shower/wash with your normal soap after using and rinsing off the CHG Soap.  9. Pat yourself dry with a CLEAN TOWEL.  10. Wear CLEAN PAJAMAS to bed the night before surgery  11. Place CLEAN SHEETS on your bed the night of your first shower and DO NOT SLEEP WITH PETS.   Day of Surgery: Wear Clean/Comfortable clothing the morning of surgery Do not apply any deodorants/lotions.   Remember to brush your teeth  WITH YOUR REGULAR TOOTHPASTE.   Please read over the following fact sheets that you were given.

## 2020-08-01 ENCOUNTER — Other Ambulatory Visit: Payer: Self-pay

## 2020-08-01 ENCOUNTER — Encounter (HOSPITAL_COMMUNITY): Payer: Self-pay

## 2020-08-01 ENCOUNTER — Encounter (HOSPITAL_COMMUNITY)
Admission: RE | Admit: 2020-08-01 | Discharge: 2020-08-01 | Disposition: A | Discharge status: Home / Self Care | Payer: PRIVATE HEALTH INSURANCE | Source: Ambulatory Visit | Attending: Orthopedic Surgery | Admitting: Orthopedic Surgery

## 2020-08-01 ENCOUNTER — Other Ambulatory Visit (HOSPITAL_COMMUNITY)
Admission: RE | Admit: 2020-08-01 | Discharge: 2020-08-01 | Disposition: A | Discharge status: Home / Self Care | Payer: PRIVATE HEALTH INSURANCE | Source: Ambulatory Visit | Attending: Orthopedic Surgery | Admitting: Orthopedic Surgery

## 2020-08-01 DIAGNOSIS — E119 Type 2 diabetes mellitus without complications: Secondary | ICD-10-CM | POA: Insufficient documentation

## 2020-08-01 DIAGNOSIS — G4733 Obstructive sleep apnea (adult) (pediatric): Secondary | ICD-10-CM | POA: Insufficient documentation

## 2020-08-01 DIAGNOSIS — Z20822 Contact with and (suspected) exposure to covid-19: Secondary | ICD-10-CM | POA: Insufficient documentation

## 2020-08-01 DIAGNOSIS — E785 Hyperlipidemia, unspecified: Secondary | ICD-10-CM | POA: Insufficient documentation

## 2020-08-01 DIAGNOSIS — Z7982 Long term (current) use of aspirin: Secondary | ICD-10-CM | POA: Insufficient documentation

## 2020-08-01 DIAGNOSIS — Z9989 Dependence on other enabling machines and devices: Secondary | ICD-10-CM | POA: Insufficient documentation

## 2020-08-01 DIAGNOSIS — I25110 Atherosclerotic heart disease of native coronary artery with unstable angina pectoris: Secondary | ICD-10-CM | POA: Insufficient documentation

## 2020-08-01 DIAGNOSIS — Z8616 Personal history of COVID-19: Secondary | ICD-10-CM | POA: Insufficient documentation

## 2020-08-01 DIAGNOSIS — Z955 Presence of coronary angioplasty implant and graft: Secondary | ICD-10-CM | POA: Insufficient documentation

## 2020-08-01 DIAGNOSIS — Z01812 Encounter for preprocedural laboratory examination: Secondary | ICD-10-CM | POA: Insufficient documentation

## 2020-08-01 LAB — COMPREHENSIVE METABOLIC PANEL
ALT: 39 U/L (ref 0–44)
AST: 33 U/L (ref 15–41)
Albumin: 3.5 g/dL (ref 3.5–5.0)
Alkaline Phosphatase: 54 U/L (ref 38–126)
Anion gap: 14 (ref 5–15)
BUN: 14 mg/dL (ref 6–20)
CO2: 23 mmol/L (ref 22–32)
Calcium: 9.1 mg/dL (ref 8.9–10.3)
Chloride: 102 mmol/L (ref 98–111)
Creatinine, Ser: 0.8 mg/dL (ref 0.61–1.24)
GFR, Estimated: >60 mL/min (ref >60)
Glucose, Bld: 201 mg/dL — ABNORMAL HIGH (ref 70–99)
Potassium: 4.1 mmol/L (ref 3.5–5.1)
Sodium: 139 mmol/L (ref 135–145)
Total Bilirubin: 0.6 mg/dL (ref 0.3–1.2)
Total Protein: 7.1 g/dL (ref 6.5–8.1)

## 2020-08-01 LAB — URINALYSIS, ROUTINE W REFLEX MICROSCOPIC
Bilirubin Urine: NEGATIVE
Glucose, UA: 150 mg/dL — AB
Ketones, ur: NEGATIVE mg/dL
Leukocytes,Ua: NEGATIVE
Nitrite: NEGATIVE
Protein, ur: 100 mg/dL — AB
Specific Gravity, Urine: 1.025 (ref 1.005–1.030)
pH: 5 (ref 5.0–8.0)

## 2020-08-01 LAB — TYPE AND SCREEN
ABO/RH(D): A NEG
Antibody Screen: NEGATIVE

## 2020-08-01 LAB — CBC WITH DIFFERENTIAL/PLATELET
Abs Immature Granulocytes: 0.04 10*3/uL (ref 0.00–0.07)
Basophils Absolute: 0.1 10*3/uL (ref 0.0–0.1)
Basophils Relative: 1 %
Eosinophils Absolute: 0.1 10*3/uL (ref 0.0–0.5)
Eosinophils Relative: 1 %
HCT: 46.8 % (ref 39.0–52.0)
Hemoglobin: 15.6 g/dL (ref 13.0–17.0)
Immature Granulocytes: 0 %
Lymphocytes Relative: 28 %
Lymphs Abs: 2.8 10*3/uL (ref 0.7–4.0)
MCH: 30.2 pg (ref 26.0–34.0)
MCHC: 33.3 g/dL (ref 30.0–36.0)
MCV: 90.5 fL (ref 80.0–100.0)
Monocytes Absolute: 0.8 10*3/uL (ref 0.1–1.0)
Monocytes Relative: 8 %
Neutro Abs: 6.3 10*3/uL (ref 1.7–7.7)
Neutrophils Relative %: 62 %
Platelets: 199 10*3/uL (ref 150–400)
RBC: 5.17 MIL/uL (ref 4.22–5.81)
RDW: 13 % (ref 11.5–15.5)
WBC: 10.1 10*3/uL (ref 4.0–10.5)
nRBC: 0 % (ref 0.0–0.2)

## 2020-08-01 LAB — PROTIME-INR
INR: 1 (ref 0.8–1.2)
Prothrombin Time: 13.1 seconds (ref 11.4–15.2)

## 2020-08-01 LAB — SARS CORONAVIRUS 2 (TAT 6-24 HRS): SARS Coronavirus 2: NEGATIVE

## 2020-08-01 LAB — SURGICAL PCR SCREEN
MRSA, PCR: NEGATIVE
Staphylococcus aureus: POSITIVE — AB

## 2020-08-01 LAB — APTT: aPTT: 28 seconds (ref 24–36)

## 2020-08-01 LAB — GLUCOSE, CAPILLARY: Glucose-Capillary: 241 mg/dL — ABNORMAL HIGH (ref 70–99)

## 2020-08-01 NOTE — Progress Notes (Addendum)
PCP - Dr. Reynold Bowen Cardiologist - Dr. Ericka Pontiff  Chest x-ray - Not indicated EKG - 12/23/19 Stress Test - 09/04/18 ECHO - 08/12/15 Cardiac Cath - 08/14/15  Sleep Study - 11/2015 Has OSa CPAP - Yes  DM -Type II Insulin pump Fasting Blood Sugar - Averages 120 CBG at [facility] appt 240 Checks Blood Sugar ___3__ times a day Latest A1C 7.6 records sent from PCP in shadow chart  Aspirin Instructions: OK to hold 5-7 days per cards.  Last dose 07/23/20  ERAS Protcol -Yes instructions given PRE-SURGERY water  COVID TEST- 08/01/20   Anesthesia review: Yes H/O CAD  Patient denies shortness of breath, fever, cough and chest pain at PAT appointment   All instructions explained to the patient, with a verbal understanding of the material. Patient agrees to go over the instructions while at home for a better understanding. Patient also instructed to self quarantine after being tested for COVID-19. The opportunity to ask questions was provided.

## 2020-08-02 MED ORDER — DEXTROSE 5 % IV SOLN
3.0000 g | INTRAVENOUS | Status: DC
  Filled 2020-08-02: qty 3000

## 2020-08-02 NOTE — Progress Notes (Signed)
Anesthesia Chart Review:  Follows with cardiology for history of CAD, prior unstable angina, RCA DES 2015, hyperlipidemia, OSA.  Last seen by Dr. Daneen [name] 12/23/2019.  Per note, chart reviewed as part of pre-operative protocol coverage. Patient was contacted 06/13/2020 in reference to pre-operative risk assessment for pending surgery as outlined below.  Alec [name] was last seen on 12/23/19 by Dr. Tamala [name].  Since that day, Alec [name] has done well. Therefore, based on ACC/AHA guidelines, the patient would be at acceptable risk for the planned procedure without further cardiovascular testing. Okay to hold ASA 5-7 prior to surgery."  Had COVID-05 October 2019, received monoclonal antibodies.  OSA on CPAP.  DM2 on insulin pump, last A1c 7.6 on 07/13/2020.  Preop labs reviewed, unremarkable.  EKG 12/23/2019: Sinus rhythm.  Rate 87.  Small inferior Q waves.  Questionable early QRS transition.  No substantial change.  PFT 01/03/2020: FVC-%Pred-Pre Latest Units: % 75  FEV1-%Pred-Pre Latest Units: % 76  FEV1FVC-%Pred-Pre Latest Units: % 100  TLC % pred Latest Units: % 86  RV % pred Latest Units: % 104  DLCO unc % pred Latest Units: % 100    Nuclear stress 09/04/2018:  Nuclear stress EF: 56%.  Clinically and electrically negative for ischemia  Normal perfusion No ischemia or scar  Low risk study.  Cath 08/14/2015:  Mild luminal irregularities are noted in the right coronary, and circumflex.  Widely patent proximal and distal RCA stents placed in August 2015.  Normal LV function with elevated end-diastolic pressure.  Prolonged chest discomfort, greater than 24 hours without EKG changes or enzyme elevation suggests alternative explanation than ischemia/infarction.   RECOMMENDATIONS:   Consider alternative explanations for chest discomfort. It is conceivable that the patient's initial presentation in August 2015 was noncardiac. The right coronary was stented after FFR in the  setting of chest pain that was interpreted as angina.   Alec [name] [facility] Short Stay [facility]/Anesthesiology Phone [phone] 08/02/2020 9:01 AM

## 2020-08-02 NOTE — Anesthesia Preprocedure Evaluation (Addendum)
Anesthesia Evaluation  Patient identified by MRN, date of birth, ID band Patient awake    Reviewed: Allergy & Precautions, NPO status , Patient's Chart, lab work & pertinent test results  Airway Mallampati: III  TM Distance: >3 FB Neck ROM: Full    Dental  (+) Teeth Intact, Dental Advisory Given   Pulmonary    breath sounds clear to auscultation       Cardiovascular  Rhythm:Regular Rate:Normal     Neuro/Psych    GI/Hepatic   Endo/Other  diabetes  Renal/GU      Musculoskeletal   Abdominal (+) + obese,   Peds  Hematology   Anesthesia Other Findings   Reproductive/Obstetrics                            Anesthesia Physical Anesthesia Plan  ASA: III  Anesthesia Plan: General   Post-op Pain Management:    Induction: Intravenous  PONV Risk Score and Plan: Ondansetron  Airway Management Planned: Oral ETT  Additional Equipment:   Intra-op Plan:   Post-operative Plan: Extubation in OR  Informed Consent: I have reviewed the patients History and Physical, chart, labs and discussed the procedure including the risks, benefits and alternatives for the proposed anesthesia with the patient or authorized representative who has indicated his/her understanding and acceptance.     Dental advisory given  Plan Discussed with:   Anesthesia Plan Comments: (PAT note by Karoline Caldwell, PA-C: Follows with cardiology for history of CAD, prior unstable angina, RCA DES 2015, hyperlipidemia, OSA.  Last seen by Dr. Daneen Schick 12/23/2019.  Per note, chart reviewed as part of pre-operative protocol coverage. Patient was contacted 06/13/2020 in reference to pre-operative risk assessment for pending surgery as outlined below.  Raquel Sayres [place] was last seen on 12/23/19 by Dr. Tamala Julian.  Since that day, Burton Gahan Doughty has done well. Therefore, based on ACC/AHA guidelines, the patient would be at acceptable risk for the  planned procedure without further cardiovascular testing. Okay to hold ASA 5-7 prior to surgery."  Had COVID-05 October 2019, received monoclonal antibodies.  OSA on CPAP.  DM2 on insulin pump, last A1c 7.6 on 07/13/2020.  Preop labs reviewed, unremarkable.  EKG 12/23/2019: Sinus rhythm.  Rate 87.  Small inferior Q waves.  Questionable early QRS transition.  No substantial change.  PFT 01/03/2020: FVC-%Pred-Pre Latest Units: % 75 FEV1-%Pred-Pre Latest Units: % 76 FEV1FVC-%Pred-Pre Latest Units: % 100 TLC % pred Latest Units: % 86 RV % pred Latest Units: % 104 DLCO unc % pred Latest Units: % 100   Nuclear stress 09/04/2018: Nuclear stress EF: 56%. Clinically and electrically negative for ischemia Normal perfusion No ischemia or scar Low risk study.  Cath 08/14/2015:  Mild luminal irregularities are noted in the right coronary, and circumflex.  Widely patent proximal and distal RCA stents placed in August 2015.  Normal LV function with elevated end-diastolic pressure.  Prolonged chest discomfort, greater than 24 hours without EKG changes or enzyme elevation suggests alternative explanation than ischemia/infarction.   RECOMMENDATIONS:  Plan GA with endotool. D/C insulin pump and restart either on floor or in [place]   Consider alternative explanations for chest discomfort. It is conceivable that the patient's initial presentation in August 2015 was noncardiac. The right coronary was stented after FFR in the setting of chest pain that was interpreted as angina.)       Anesthesia Quick Evaluation

## 2020-08-03 ENCOUNTER — Encounter (HOSPITAL_COMMUNITY)
Admission: RE | Disposition: A | Discharge status: Home / Self Care | Payer: Self-pay | Source: Home / Self Care | Attending: Orthopedic Surgery

## 2020-08-03 ENCOUNTER — Inpatient Hospital Stay (HOSPITAL_COMMUNITY): Payer: PRIVATE HEALTH INSURANCE

## 2020-08-03 ENCOUNTER — Inpatient Hospital Stay (HOSPITAL_COMMUNITY): Payer: PRIVATE HEALTH INSURANCE | Admitting: Physician Assistant

## 2020-08-03 ENCOUNTER — Inpatient Hospital Stay (HOSPITAL_COMMUNITY)
Admission: RE | Admit: 2020-08-03 | Discharge: 2020-08-04 | DRG: 460 | Disposition: A | Discharge status: Home / Self Care | Payer: PRIVATE HEALTH INSURANCE | Attending: Orthopedic Surgery | Admitting: Orthopedic Surgery

## 2020-08-03 ENCOUNTER — Other Ambulatory Visit: Payer: Self-pay

## 2020-08-03 ENCOUNTER — Encounter (HOSPITAL_COMMUNITY): Payer: Self-pay | Admitting: Orthopedic Surgery

## 2020-08-03 ENCOUNTER — Inpatient Hospital Stay (HOSPITAL_COMMUNITY): Payer: PRIVATE HEALTH INSURANCE | Admitting: Anesthesiology

## 2020-08-03 DIAGNOSIS — E782 Mixed hyperlipidemia: Secondary | ICD-10-CM | POA: Diagnosis present

## 2020-08-03 DIAGNOSIS — M1990 Unspecified osteoarthritis, unspecified site: Secondary | ICD-10-CM | POA: Diagnosis present

## 2020-08-03 DIAGNOSIS — Z8249 Family history of ischemic heart disease and other diseases of the circulatory system: Secondary | ICD-10-CM | POA: Diagnosis not present

## 2020-08-03 DIAGNOSIS — Z801 Family history of malignant neoplasm of trachea, bronchus and lung: Secondary | ICD-10-CM

## 2020-08-03 DIAGNOSIS — G9519 Other vascular myelopathies: Secondary | ICD-10-CM | POA: Diagnosis present

## 2020-08-03 DIAGNOSIS — Z833 Family history of diabetes mellitus: Secondary | ICD-10-CM | POA: Diagnosis not present

## 2020-08-03 DIAGNOSIS — G4733 Obstructive sleep apnea (adult) (pediatric): Secondary | ICD-10-CM | POA: Diagnosis present

## 2020-08-03 DIAGNOSIS — Z79899 Other long term (current) drug therapy: Secondary | ICD-10-CM | POA: Diagnosis not present

## 2020-08-03 DIAGNOSIS — E119 Type 2 diabetes mellitus without complications: Secondary | ICD-10-CM | POA: Diagnosis present

## 2020-08-03 DIAGNOSIS — Z9641 Presence of insulin pump (external) (internal): Secondary | ICD-10-CM | POA: Diagnosis present

## 2020-08-03 DIAGNOSIS — Z8042 Family history of malignant neoplasm of prostate: Secondary | ICD-10-CM | POA: Diagnosis not present

## 2020-08-03 DIAGNOSIS — M109 Gout, unspecified: Secondary | ICD-10-CM | POA: Diagnosis present

## 2020-08-03 DIAGNOSIS — Z825 Family history of asthma and other chronic lower respiratory diseases: Secondary | ICD-10-CM | POA: Diagnosis not present

## 2020-08-03 DIAGNOSIS — G8929 Other chronic pain: Secondary | ICD-10-CM | POA: Diagnosis present

## 2020-08-03 DIAGNOSIS — Z6841 Body Mass Index (BMI) 40.0 and over, adult: Secondary | ICD-10-CM | POA: Diagnosis not present

## 2020-08-03 DIAGNOSIS — Z981 Arthrodesis status: Secondary | ICD-10-CM

## 2020-08-03 DIAGNOSIS — Z7722 Contact with and (suspected) exposure to environmental tobacco smoke (acute) (chronic): Secondary | ICD-10-CM | POA: Diagnosis present

## 2020-08-03 DIAGNOSIS — M545 Low back pain, unspecified: Secondary | ICD-10-CM | POA: Diagnosis present

## 2020-08-03 DIAGNOSIS — Z886 Allergy status to analgesic agent status: Secondary | ICD-10-CM

## 2020-08-03 DIAGNOSIS — Z794 Long term (current) use of insulin: Secondary | ICD-10-CM

## 2020-08-03 DIAGNOSIS — F32A Depression, unspecified: Secondary | ICD-10-CM | POA: Diagnosis present

## 2020-08-03 DIAGNOSIS — M48062 Spinal stenosis, lumbar region with neurogenic claudication: Secondary | ICD-10-CM | POA: Diagnosis present

## 2020-08-03 DIAGNOSIS — M960 Pseudarthrosis after fusion or arthrodesis: Secondary | ICD-10-CM | POA: Diagnosis present

## 2020-08-03 DIAGNOSIS — Z7982 Long term (current) use of aspirin: Secondary | ICD-10-CM

## 2020-08-03 DIAGNOSIS — E669 Obesity, unspecified: Secondary | ICD-10-CM | POA: Diagnosis present

## 2020-08-03 DIAGNOSIS — Z20822 Contact with and (suspected) exposure to covid-19: Secondary | ICD-10-CM | POA: Diagnosis present

## 2020-08-03 DIAGNOSIS — I2510 Atherosclerotic heart disease of native coronary artery without angina pectoris: Secondary | ICD-10-CM | POA: Diagnosis present

## 2020-08-03 DIAGNOSIS — R531 Weakness: Secondary | ICD-10-CM | POA: Diagnosis present

## 2020-08-03 DIAGNOSIS — M62838 Other muscle spasm: Secondary | ICD-10-CM | POA: Diagnosis present

## 2020-08-03 DIAGNOSIS — R200 Anesthesia of skin: Secondary | ICD-10-CM | POA: Diagnosis present

## 2020-08-03 DIAGNOSIS — Z419 Encounter for procedure for purposes other than remedying health state, unspecified: Secondary | ICD-10-CM

## 2020-08-03 HISTORY — PX: ANTERIOR LAT LUMBAR FUSION: SHX1168

## 2020-08-03 LAB — GLUCOSE, CAPILLARY
Glucose-Capillary: 92 mg/dL (ref 70–99)
Glucose-Capillary: 98 mg/dL (ref 70–99)
Glucose-Capillary: 99 mg/dL (ref 70–99)
Glucose-Capillary: 155 mg/dL — ABNORMAL HIGH (ref 70–99)
Glucose-Capillary: 278 mg/dL — ABNORMAL HIGH (ref 70–99)
Glucose-Capillary: 274 mg/dL — ABNORMAL HIGH (ref 70–99)

## 2020-08-03 LAB — HEMOGLOBIN A1C
Hgb A1c MFr Bld: 7.8 % — ABNORMAL HIGH (ref 4.8–5.6)
Mean Plasma Glucose: 177.16 mg/dL

## 2020-08-03 SURGERY — ANTERIOR LATERAL LUMBAR FUSION 1 LEVEL
Anesthesia: General | Site: Spine Lumbar | Laterality: Right

## 2020-08-03 MED ORDER — PHENYLEPHRINE HCL-NACL 10-0.9 MG/250ML-% IV SOLN
INTRAVENOUS | Status: DC | PRN
  Administered 2020-08-03: 50 ug/min via INTRAVENOUS

## 2020-08-03 MED ORDER — METHOCARBAMOL 500 MG PO TABS
500.0000 mg | ORAL_TABLET | Freq: Four times a day (QID) | ORAL | Status: DC | PRN
  Administered 2020-08-03 – 2020-08-04 (×3): 500 mg via ORAL
  Filled 2020-08-03 (×2): qty 1

## 2020-08-03 MED ORDER — BUPIVACAINE-EPINEPHRINE 0.25% -1:200000 IJ SOLN
INTRAMUSCULAR | Status: DC | PRN
  Administered 2020-08-03: 30 mL

## 2020-08-03 MED ORDER — DEXAMETHASONE SODIUM PHOSPHATE 10 MG/ML IJ SOLN
INTRAMUSCULAR | Status: DC | PRN
  Administered 2020-08-03: 10 mg via INTRAVENOUS

## 2020-08-03 MED ORDER — CHLORHEXIDINE GLUCONATE 0.12 % MT SOLN
15.0000 mL | Freq: Once | OROMUCOSAL | Status: AC
  Administered 2020-08-03: 15 mL via OROMUCOSAL
  Filled 2020-08-03: qty 15

## 2020-08-03 MED ORDER — ESCITALOPRAM OXALATE 20 MG PO TABS
20.0000 mg | ORAL_TABLET | Freq: Every day | ORAL | Status: DC
  Administered 2020-08-03: 20 mg via ORAL
  Filled 2020-08-03 (×2): qty 1

## 2020-08-03 MED ORDER — DEXAMETHASONE SODIUM PHOSPHATE 10 MG/ML IJ SOLN
INTRAMUSCULAR | Status: AC
  Filled 2020-08-03: qty 1

## 2020-08-03 MED ORDER — BUPIVACAINE-EPINEPHRINE (PF) 0.25% -1:200000 IJ SOLN
INTRAMUSCULAR | Status: AC
  Filled 2020-08-03: qty 10

## 2020-08-03 MED ORDER — LORATADINE 10 MG PO TABS
10.0000 mg | ORAL_TABLET | Freq: Every day | ORAL | Status: DC
  Administered 2020-08-03: 10 mg via ORAL
  Filled 2020-08-03: qty 1

## 2020-08-03 MED ORDER — OXYCODONE-ACETAMINOPHEN 5-325 MG PO TABS
1.0000 | ORAL_TABLET | ORAL | Status: DC | PRN
  Administered 2020-08-03 – 2020-08-04 (×5): 2 via ORAL
  Filled 2020-08-03 (×5): qty 2

## 2020-08-03 MED ORDER — DEXTROSE 5 % IV SOLN
INTRAVENOUS | Status: DC | PRN
  Administered 2020-08-03: 3 g via INTRAVENOUS

## 2020-08-03 MED ORDER — ALLOPURINOL 300 MG PO TABS
300.0000 mg | ORAL_TABLET | Freq: Every day | ORAL | Status: DC
  Administered 2020-08-03: 300 mg via ORAL
  Filled 2020-08-03 (×2): qty 1

## 2020-08-03 MED ORDER — METHOCARBAMOL 500 MG PO TABS
ORAL_TABLET | ORAL | Status: AC
  Filled 2020-08-03: qty 1

## 2020-08-03 MED ORDER — INSULIN REGULAR(HUMAN) IN NACL 100-0.9 UT/100ML-% IV SOLN
INTRAVENOUS | Status: DC
  Filled 2020-08-03: qty 100

## 2020-08-03 MED ORDER — MONTELUKAST SODIUM 10 MG PO TABS: 10.0000 mg | ORAL_TABLET | Freq: Every evening | ORAL | Status: DC | PRN

## 2020-08-03 MED ORDER — ZOLPIDEM TARTRATE 5 MG PO TABS: 5.0000 mg | ORAL_TABLET | Freq: Every evening | ORAL | Status: DC | PRN

## 2020-08-03 MED ORDER — INSULIN ASPART 100 UNIT/ML ~~LOC~~ SOLN: 0.0000 [IU] | Freq: Three times a day (TID) | SUBCUTANEOUS | Status: DC

## 2020-08-03 MED ORDER — MIDAZOLAM HCL 2 MG/2ML IJ SOLN
INTRAMUSCULAR | Status: AC
  Filled 2020-08-03: qty 2

## 2020-08-03 MED ORDER — PROPOFOL 1000 MG/100ML IV EMUL
INTRAVENOUS | Status: AC
  Filled 2020-08-03: qty 200

## 2020-08-03 MED ORDER — LACTATED RINGERS IV SOLN: INTRAVENOUS | Status: DC

## 2020-08-03 MED ORDER — PHENOL 1.4 % MT LIQD: 1.0000 | OROMUCOSAL | Status: DC | PRN

## 2020-08-03 MED ORDER — POTASSIUM CHLORIDE IN NACL 20-0.9 MEQ/L-% IV SOLN: INTRAVENOUS | Status: DC

## 2020-08-03 MED ORDER — FENTANYL CITRATE (PF) 250 MCG/5ML IJ SOLN
INTRAMUSCULAR | Status: DC | PRN
  Administered 2020-08-03: 100 ug via INTRAVENOUS
  Administered 2020-08-03: 50 ug via INTRAVENOUS
  Administered 2020-08-03: 100 ug via INTRAVENOUS
  Administered 2020-08-03 (×2): 50 ug via INTRAVENOUS
  Administered 2020-08-03: 100 ug via INTRAVENOUS
  Administered 2020-08-03: 50 ug via INTRAVENOUS

## 2020-08-03 MED ORDER — RAMIPRIL 10 MG PO CAPS
10.0000 mg | ORAL_CAPSULE | Freq: Every day | ORAL | Status: DC
  Administered 2020-08-03: 10 mg via ORAL
  Filled 2020-08-03 (×2): qty 1

## 2020-08-03 MED ORDER — ONDANSETRON HCL 4 MG PO TABS
4.0000 mg | ORAL_TABLET | Freq: Four times a day (QID) | ORAL | Status: DC | PRN
  Administered 2020-08-03: 4 mg via ORAL
  Filled 2020-08-03: qty 1

## 2020-08-03 MED ORDER — MENTHOL 3 MG MT LOZG: 1.0000 | LOZENGE | OROMUCOSAL | Status: DC | PRN

## 2020-08-03 MED ORDER — EPHEDRINE 5 MG/ML INJ
INTRAVENOUS | Status: AC
  Filled 2020-08-03: qty 10

## 2020-08-03 MED ORDER — FENOFIBRATE 160 MG PO TABS
160.0000 mg | ORAL_TABLET | Freq: Every day | ORAL | Status: DC
  Administered 2020-08-03: 160 mg via ORAL
  Filled 2020-08-03: qty 1

## 2020-08-03 MED ORDER — METOPROLOL SUCCINATE ER 25 MG PO TB24: 25.0000 mg | ORAL_TABLET | Freq: Every day | ORAL | Status: DC

## 2020-08-03 MED ORDER — PROPOFOL 10 MG/ML IV BOLUS
INTRAVENOUS | Status: AC
  Filled 2020-08-03: qty 20

## 2020-08-03 MED ORDER — ONDANSETRON HCL 4 MG/2ML IJ SOLN
INTRAMUSCULAR | Status: AC
  Filled 2020-08-03: qty 2

## 2020-08-03 MED ORDER — METHOCARBAMOL 1000 MG/10ML IJ SOLN
500.0000 mg | Freq: Four times a day (QID) | INTRAVENOUS | Status: DC | PRN
  Filled 2020-08-03: qty 5

## 2020-08-03 MED ORDER — FENTANYL CITRATE (PF) 250 MCG/5ML IJ SOLN
INTRAMUSCULAR | Status: AC
  Filled 2020-08-03: qty 5

## 2020-08-03 MED ORDER — PHENYLEPHRINE 40 MCG/ML (10ML) SYRINGE FOR IV PUSH (FOR BLOOD PRESSURE SUPPORT)
PREFILLED_SYRINGE | INTRAVENOUS | Status: AC
  Filled 2020-08-03: qty 10

## 2020-08-03 MED ORDER — SODIUM CHLORIDE 0.9 % IV SOLN
250.0000 mL | INTRAVENOUS | Status: DC
  Administered 2020-08-03: 250 mL via INTRAVENOUS

## 2020-08-03 MED ORDER — POVIDONE-IODINE 7.5 % EX SOLN
Freq: Once | CUTANEOUS | Status: DC
  Filled 2020-08-03: qty 118

## 2020-08-03 MED ORDER — BISACODYL 5 MG PO TBEC: 5.0000 mg | DELAYED_RELEASE_TABLET | Freq: Every day | ORAL | Status: DC | PRN

## 2020-08-03 MED ORDER — PHENYLEPHRINE 40 MCG/ML (10ML) SYRINGE FOR IV PUSH (FOR BLOOD PRESSURE SUPPORT)
PREFILLED_SYRINGE | INTRAVENOUS | Status: DC | PRN
  Administered 2020-08-03 (×5): 200 ug via INTRAVENOUS

## 2020-08-03 MED ORDER — 0.9 % SODIUM CHLORIDE (POUR BTL) OPTIME
TOPICAL | Status: DC | PRN
  Administered 2020-08-03 (×3): 1000 mL

## 2020-08-03 MED ORDER — NITROGLYCERIN 0.4 MG SL SUBL: 0.4000 mg | SUBLINGUAL_TABLET | SUBLINGUAL | Status: DC | PRN

## 2020-08-03 MED ORDER — THROMBIN 20000 UNITS EX SOLR
CUTANEOUS | Status: DC | PRN
  Administered 2020-08-03: 20 mL via TOPICAL

## 2020-08-03 MED ORDER — PROPOFOL 10 MG/ML IV BOLUS
INTRAVENOUS | Status: DC | PRN
  Administered 2020-08-03 (×2): 50 mg via INTRAVENOUS
  Administered 2020-08-03: 170 mg via INTRAVENOUS
  Administered 2020-08-03: 50 mg via INTRAVENOUS

## 2020-08-03 MED ORDER — DEXMEDETOMIDINE (PRECEDEX) IN NS 20 MCG/5ML (4 MCG/ML) IV SYRINGE
PREFILLED_SYRINGE | INTRAVENOUS | Status: AC
  Filled 2020-08-03: qty 5

## 2020-08-03 MED ORDER — ACETAMINOPHEN 325 MG PO TABS: 650.0000 mg | ORAL_TABLET | ORAL | Status: DC | PRN

## 2020-08-03 MED ORDER — INSULIN PUMP
Freq: Three times a day (TID) | SUBCUTANEOUS | Status: DC
  Administered 2020-08-03: 7.3 via SUBCUTANEOUS
  Administered 2020-08-03: 3.6 via SUBCUTANEOUS
  Administered 2020-08-04: 2.7 via SUBCUTANEOUS
  Filled 2020-08-03: qty 1

## 2020-08-03 MED ORDER — INSULIN REGULAR HUMAN (CONC) 500 UNIT/ML ~~LOC~~ SOLN: 5.0000 [IU] | SUBCUTANEOUS | Status: DC

## 2020-08-03 MED ORDER — SODIUM CHLORIDE 0.9% FLUSH: 3.0000 mL | INTRAVENOUS | Status: DC | PRN

## 2020-08-03 MED ORDER — SODIUM CHLORIDE 0.9% FLUSH
3.0000 mL | Freq: Two times a day (BID) | INTRAVENOUS | Status: DC
  Administered 2020-08-03 (×2): 3 mL via INTRAVENOUS

## 2020-08-03 MED ORDER — ALBUTEROL SULFATE (2.5 MG/3ML) 0.083% IN NEBU
INHALATION_SOLUTION | RESPIRATORY_TRACT | Status: AC
  Filled 2020-08-03: qty 3

## 2020-08-03 MED ORDER — ORAL CARE MOUTH RINSE: 15.0000 mL | Freq: Once | OROMUCOSAL | Status: AC

## 2020-08-03 MED ORDER — ONDANSETRON HCL 4 MG PO TABS: 4.0000 mg | ORAL_TABLET | Freq: Three times a day (TID) | ORAL | Status: DC | PRN

## 2020-08-03 MED ORDER — ONDANSETRON HCL 4 MG/2ML IJ SOLN: 4.0000 mg | Freq: Once | INTRAMUSCULAR | Status: DC | PRN

## 2020-08-03 MED ORDER — ROCURONIUM BROMIDE 10 MG/ML (PF) SYRINGE
PREFILLED_SYRINGE | INTRAVENOUS | Status: AC
  Filled 2020-08-03: qty 10

## 2020-08-03 MED ORDER — SUCCINYLCHOLINE CHLORIDE 200 MG/10ML IV SOSY
PREFILLED_SYRINGE | INTRAVENOUS | Status: DC | PRN
  Administered 2020-08-03: 140 mg via INTRAVENOUS

## 2020-08-03 MED ORDER — ICOSAPENT ETHYL 1 G PO CAPS
2.0000 g | ORAL_CAPSULE | Freq: Two times a day (BID) | ORAL | Status: DC
  Administered 2020-08-03: 2 g via ORAL
  Filled 2020-08-03 (×2): qty 2

## 2020-08-03 MED ORDER — DEXMEDETOMIDINE (PRECEDEX) IN NS 20 MCG/5ML (4 MCG/ML) IV SYRINGE
PREFILLED_SYRINGE | INTRAVENOUS | Status: DC | PRN
  Administered 2020-08-03: 20 ug via INTRAVENOUS

## 2020-08-03 MED ORDER — LIDOCAINE 2% (20 MG/ML) 5 ML SYRINGE
INTRAMUSCULAR | Status: AC
  Filled 2020-08-03: qty 5

## 2020-08-03 MED ORDER — DOCUSATE SODIUM 100 MG PO CAPS
100.0000 mg | ORAL_CAPSULE | Freq: Two times a day (BID) | ORAL | Status: DC
  Administered 2020-08-03: 100 mg via ORAL
  Filled 2020-08-03: qty 1

## 2020-08-03 MED ORDER — ALBUTEROL SULFATE (2.5 MG/3ML) 0.083% IN NEBU
2.5000 mg | INHALATION_SOLUTION | Freq: Once | RESPIRATORY_TRACT | Status: AC
  Administered 2020-08-03: 2.5 mg via RESPIRATORY_TRACT

## 2020-08-03 MED ORDER — FLEET ENEMA 7-19 GM/118ML RE ENEM: 1.0000 | ENEMA | Freq: Once | RECTAL | Status: DC | PRN

## 2020-08-03 MED ORDER — LIDOCAINE 2% (20 MG/ML) 5 ML SYRINGE
INTRAMUSCULAR | Status: DC | PRN
  Administered 2020-08-03: 60 mg via INTRAVENOUS

## 2020-08-03 MED ORDER — SENNOSIDES-DOCUSATE SODIUM 8.6-50 MG PO TABS: 1.0000 | ORAL_TABLET | Freq: Every evening | ORAL | Status: DC | PRN

## 2020-08-03 MED ORDER — FENTANYL CITRATE (PF) 100 MCG/2ML IJ SOLN
INTRAMUSCULAR | Status: AC
  Filled 2020-08-03: qty 2

## 2020-08-03 MED ORDER — VALACYCLOVIR HCL 500 MG PO TABS
1000.0000 mg | ORAL_TABLET | Freq: Every day | ORAL | Status: DC | PRN
  Filled 2020-08-03: qty 2

## 2020-08-03 MED ORDER — PROPOFOL 500 MG/50ML IV EMUL
INTRAVENOUS | Status: DC | PRN
  Administered 2020-08-03: 90 ug/kg/min via INTRAVENOUS
  Administered 2020-08-03: 50 ug/kg/min via INTRAVENOUS

## 2020-08-03 MED ORDER — CEFAZOLIN SODIUM-DEXTROSE 2-4 GM/100ML-% IV SOLN
2.0000 g | Freq: Three times a day (TID) | INTRAVENOUS | Status: AC
  Administered 2020-08-03 (×2): 2 g via INTRAVENOUS
  Filled 2020-08-03 (×2): qty 100

## 2020-08-03 MED ORDER — ATORVASTATIN CALCIUM 80 MG PO TABS
80.0000 mg | ORAL_TABLET | Freq: Every day | ORAL | Status: DC
  Administered 2020-08-03: 80 mg via ORAL
  Filled 2020-08-03: qty 1

## 2020-08-03 MED ORDER — MIDAZOLAM HCL 2 MG/2ML IJ SOLN
INTRAMUSCULAR | Status: DC | PRN
  Administered 2020-08-03: 2 mg via INTRAVENOUS

## 2020-08-03 MED ORDER — PREGABALIN 50 MG PO CAPS
50.0000 mg | ORAL_CAPSULE | Freq: Two times a day (BID) | ORAL | Status: DC
  Administered 2020-08-03: 50 mg via ORAL
  Filled 2020-08-03: qty 1

## 2020-08-03 MED ORDER — ONDANSETRON HCL 4 MG/2ML IJ SOLN: 4.0000 mg | Freq: Four times a day (QID) | INTRAMUSCULAR | Status: DC | PRN

## 2020-08-03 MED ORDER — MORPHINE SULFATE (PF) 2 MG/ML IV SOLN: 1.0000 mg | INTRAVENOUS | Status: DC | PRN

## 2020-08-03 MED ORDER — THROMBIN 20000 UNITS EX SOLR
CUTANEOUS | Status: AC
  Filled 2020-08-03: qty 20000

## 2020-08-03 MED ORDER — SUCCINYLCHOLINE CHLORIDE 200 MG/10ML IV SOSY
PREFILLED_SYRINGE | INTRAVENOUS | Status: AC
  Filled 2020-08-03: qty 10

## 2020-08-03 MED ORDER — ALIROCUMAB 150 MG/ML ~~LOC~~ SOAJ: 150.0000 mg | SUBCUTANEOUS | Status: DC

## 2020-08-03 MED ORDER — ONDANSETRON HCL 4 MG/2ML IJ SOLN
INTRAMUSCULAR | Status: DC | PRN
  Administered 2020-08-03: 4 mg via INTRAVENOUS

## 2020-08-03 MED ORDER — ALUM & MAG HYDROXIDE-SIMETH 200-200-20 MG/5ML PO SUSP: 30.0000 mL | Freq: Four times a day (QID) | ORAL | Status: DC | PRN

## 2020-08-03 MED ORDER — FENTANYL CITRATE (PF) 100 MCG/2ML IJ SOLN
25.0000 ug | INTRAMUSCULAR | Status: DC | PRN
  Administered 2020-08-03: 50 ug via INTRAVENOUS
  Administered 2020-08-03 (×4): 25 ug via INTRAVENOUS

## 2020-08-03 MED ORDER — EPHEDRINE SULFATE-NACL 50-0.9 MG/10ML-% IV SOSY
PREFILLED_SYRINGE | INTRAVENOUS | Status: DC | PRN
  Administered 2020-08-03: 20 mg via INTRAVENOUS

## 2020-08-03 MED ORDER — ACETAMINOPHEN 650 MG RE SUPP: 650.0000 mg | RECTAL | Status: DC | PRN

## 2020-08-03 SURGICAL SUPPLY — 81 items
AGENT HMST KT MTR STRL THRMB (HEMOSTASIS)
APL SKNCLS STERI-STRIP NONHPOA (GAUZE/BANDAGES/DRESSINGS) ×1
BATTALION LLIF ITRADISCAL SHIM (MISCELLANEOUS) ×3
BENZOIN TINCTURE PRP APPL 2/3 (GAUZE/BANDAGES/DRESSINGS) ×2 IMPLANT
BLADE CLIPPER SURG (BLADE) IMPLANT
BLADE SURG 10 STRL SS (BLADE) ×3 IMPLANT
BONE VIVIGEN FORMABLE 1.3CC (Bone Implant) ×3 IMPLANT
BONE VIVIGEN FORMABLE 5.4CC (Bone Implant) ×3 IMPLANT
CLIP SPRING STIM LLIF SAFEOP (CLIP) ×2 IMPLANT
CLOSURE WOUND 1/2 X4 (GAUZE/BANDAGES/DRESSINGS) ×2
COVER BACK TABLE 80X110 HD (DRAPES) ×3 IMPLANT
COVER SURGICAL LIGHT HANDLE (MISCELLANEOUS) ×1 IMPLANT
COVER WAND RF STERILE (DRAPES) ×1 IMPLANT
DILATOR INSULATED LLIF 8-13-18 (NEUROSURGERY SUPPLIES) ×2 IMPLANT
DRAPE C-ARM 42X72 X-RAY (DRAPES) ×3 IMPLANT
DRAPE C-ARMOR (DRAPES) ×3 IMPLANT
DRAPE POUCH INSTRU U-SHP 10X18 (DRAPES) ×5 IMPLANT
DRAPE SURG 17X23 STRL (DRAPES) ×12 IMPLANT
DURAPREP 26ML APPLICATOR (WOUND CARE) ×3 IMPLANT
ELECT BLADE 6.5 EXT (BLADE) ×3 IMPLANT
ELECT CAUTERY BLADE 6.4 (BLADE) ×3 IMPLANT
ELECT KIT SAFEOP SSEP/SURF (KITS) ×3
ELECT REM PT RETURN 9FT ADLT (ELECTROSURGICAL) ×3
ELECTRODE KT SAFEOP SSEP/SURF (KITS) IMPLANT
ELECTRODE REM PT RTRN 9FT ADLT (ELECTROSURGICAL) ×1 IMPLANT
GAUZE 4X4 16PLY RFD (DISPOSABLE) ×3 IMPLANT
GAUZE SPONGE 4X4 12PLY STRL LF (GAUZE/BANDAGES/DRESSINGS) ×2 IMPLANT
GLOVE BIO SURGEON STRL SZ 6.5 (GLOVE) ×5 IMPLANT
GLOVE BIO SURGEON STRL SZ7 (GLOVE) ×6 IMPLANT
GLOVE BIO SURGEON STRL SZ8 (GLOVE) ×3 IMPLANT
GLOVE BIO SURGEONS STRL SZ 6.5 (GLOVE) ×5
GLOVE BIOGEL PI IND STRL 6.5 (GLOVE) IMPLANT
GLOVE BIOGEL PI IND STRL 7.0 (GLOVE) ×1 IMPLANT
GLOVE BIOGEL PI IND STRL 8 (GLOVE) ×1 IMPLANT
GLOVE BIOGEL PI INDICATOR 6.5 (GLOVE) ×4
GLOVE BIOGEL PI INDICATOR 7.0 (GLOVE) ×2
GLOVE BIOGEL PI INDICATOR 8 (GLOVE) ×2
GLOVE SURG SS PI 7.5 STRL IVOR (GLOVE) ×4 IMPLANT
GOWN STRL REUS W/ TWL LRG LVL3 (GOWN DISPOSABLE) ×1 IMPLANT
GOWN STRL REUS W/ TWL XL LVL3 (GOWN DISPOSABLE) ×2 IMPLANT
GOWN STRL REUS W/TWL LRG LVL3 (GOWN DISPOSABLE) ×9
GOWN STRL REUS W/TWL XL LVL3 (GOWN DISPOSABLE) ×3
GRAFT BNE MATRIX VG FRMBL MD 5 (Bone Implant) IMPLANT
GRAFT BNE MATRIX VG FRMBL SM 1 (Bone Implant) IMPLANT
IV CATH 14GX2 1/4 (CATHETERS) ×3 IMPLANT
KIT BASIN OR (CUSTOM PROCEDURE TRAY) ×3 IMPLANT
KIT TURNOVER KIT B (KITS) ×3 IMPLANT
KNIFE ANNULOTOMY (BLADE) ×2 IMPLANT
MARKER SKIN DUAL TIP RULER LAB (MISCELLANEOUS) ×3 IMPLANT
NDL HYPO 25GX1X1/2 BEV (NEEDLE) ×1 IMPLANT
NDL SPNL 18GX3.5 QUINCKE PK (NEEDLE) ×1 IMPLANT
NEEDLE HYPO 25GX1X1/2 BEV (NEEDLE) ×3 IMPLANT
NEEDLE SPNL 18GX3.5 QUINCKE PK (NEEDLE) ×3 IMPLANT
NS IRRIG 1000ML POUR BTL (IV SOLUTION) ×8 IMPLANT
PACK LAMINECTOMY ORTHO (CUSTOM PROCEDURE TRAY) ×3 IMPLANT
PACK UNIVERSAL I (CUSTOM PROCEDURE TRAY) ×3 IMPLANT
PAD ARMBOARD 7.5X6 YLW CONV (MISCELLANEOUS) ×6 IMPLANT
PLATE LIF AMP 2/SCRW 10/15 (Plate) ×2 IMPLANT
PROBE BALL TIP LLIF SAFEOP (NEUROSURGERY SUPPLIES) ×2 IMPLANT
SCREW LIF AMP 5.5X55 (Screw) ×2 IMPLANT
SHIM ITRADISCAL BATTALION LLIF (MISCELLANEOUS) IMPLANT
SPACER LIF IDENTITI 12X22X60 0 (Spacer) ×2 IMPLANT
SPONGE INTESTINAL PEANUT (DISPOSABLE) ×6 IMPLANT
SPONGE LAP 4X18 RFD (DISPOSABLE) ×3 IMPLANT
SPONGE SURGIFOAM ABS GEL 100 (HEMOSTASIS) ×2 IMPLANT
STAPLER VISISTAT 35W (STAPLE) ×3 IMPLANT
STRIP CLOSURE SKIN 1/2X4 (GAUZE/BANDAGES/DRESSINGS) ×2 IMPLANT
SURGIFLO W/THROMBIN 8M KIT (HEMOSTASIS) IMPLANT
SUT MNCRL AB 4-0 PS2 18 (SUTURE) ×3 IMPLANT
SUT VIC AB 0 CT1 18XCR BRD 8 (SUTURE) ×1 IMPLANT
SUT VIC AB 0 CT1 8-18 (SUTURE)
SUT VIC AB 1 CT1 18XCR BRD 8 (SUTURE) ×1 IMPLANT
SUT VIC AB 1 CT1 8-18 (SUTURE) ×3
SUT VIC AB 2-0 CT2 18 VCP726D (SUTURE) ×5 IMPLANT
SYR BULB IRRIG 60ML STRL (SYRINGE) ×3 IMPLANT
SYR CONTROL 10ML LL (SYRINGE) ×3 IMPLANT
TOWEL GREEN STERILE (TOWEL DISPOSABLE) ×3 IMPLANT
TOWEL GREEN STERILE FF (TOWEL DISPOSABLE) ×3 IMPLANT
TRAY FOLEY MTR SLVR 16FR STAT (SET/KITS/TRAYS/PACK) ×3 IMPLANT
WATER STERILE IRR 1000ML POUR (IV SOLUTION) ×3 IMPLANT
YANKAUER SUCT BULB TIP NO VENT (SUCTIONS) ×3 IMPLANT

## 2020-08-03 NOTE — Evaluation (Signed)
Physical Therapy Evaluation Patient Details Name: Alec [name] MRN: [id number] DOB: [birth year] Today's Date: 08/03/2020   History of Present Illness  Patient is a 59 y/o male with ongoing pain, numbness and weakness in legs. PMH includes CAD, gout, metabolic syndrome, OSA, and Type II DM on insulin pump. Patient s/p R sided lumbar fusion of L3-4.  Clinical Impression  Patient reports independence with mobility and had pain/numbness/weakness in B LE prior to surgery. Patient states numbness has improved since surgery. Patient overall required min guard for bed mobility, transfers, and ambulation. Patient ambulated 150' with IV pole and min guard, ambulated 25' with no AD and min guard with a decrease in pace and more guarded gait pattern. Patient will benefit from skilled PT services to address listed deficits (see PT Problem list). Anticipate no PT follow up following discharge.     Follow Up Recommendations No PT follow up;Supervision for mobility/OOB    Equipment Recommendations  None recommended by PT    Recommendations for Other Services       Precautions / Restrictions Precautions Precautions: Back;Fall Precaution Booklet Issued: Yes (comment) Required Braces or Orthoses: Spinal Brace Spinal Brace: Thoracolumbosacral orthotic Restrictions Weight Bearing Restrictions: No      Mobility  Bed Mobility Overal bed mobility: Needs Assistance Bed Mobility: Rolling;Sidelying to Sit;Sit to Sidelying Rolling: Min guard Sidelying to sit: Min guard     Sit to sidelying: Min guard General bed mobility comments: min guard overall for log roll technique    Transfers Overall transfer level: Needs assistance Equipment used: None Transfers: Sit to/from Stand Sit to Stand: Min guard         General transfer comment: min guard for safety  Ambulation/Gait Ambulation/Gait assistance: Min guard Gait Distance (Feet): 150 Feet Assistive device: IV Pole Gait Pattern/deviations:  Step-through pattern;Decreased stride length;Wide base of support Gait velocity: decreased   General Gait Details: guarded gait pattern. Patient ambulated 25' with no AD and min guard with a decrease in pace and more hesitant/guarded gait pattern  Stairs            Wheelchair Mobility    Modified Rankin (Stroke Patients Only)       Balance Overall balance assessment: Needs assistance Sitting-balance support: No upper extremity supported;Feet supported Sitting balance-Leahy Scale: Good     Standing balance support: No upper extremity supported;During functional activity Standing balance-Leahy Scale: Fair                               Pertinent Vitals/Pain Pain Assessment: Faces Faces Pain Scale: Hurts little more Pain Location: low back Pain Descriptors / Indicators: Grimacing;Guarding Pain Intervention(s): Monitored during session;Premedicated before session    Home Living Family/patient expects to be discharged to:: Private residence Living Arrangements: Spouse/significant other;Children Available Help at Discharge: Family;Available 24 hours/day Type of Home: House Home Access: Stairs to enter Entrance Stairs-Rails: Can reach both Entrance Stairs-Number of Steps: 5 Home Layout: One level Home Equipment: Alec [name] - 4 wheels;Shower seat - built in;Bedside commode      Prior Function Level of Independence: Independent               Hand Dominance        Extremity/Trunk Assessment   Upper Extremity Assessment Upper Extremity Assessment: Defer to OT evaluation    Lower Extremity Assessment Lower Extremity Assessment: Generalized weakness (L>R)       Communication   Communication: No difficulties  Cognition Arousal/Alertness: Awake/alert  Behavior During Therapy: WFL for tasks assessed/performed Overall Cognitive Status: Within Functional Limits for tasks assessed                                        General  Comments General comments (skin integrity, edema, etc.): wife present during session. Patient received on 2L O2 Alec [name]. Removed and spO2 maintained at 92 and increased to 94 with sitting EOB    Exercises     Assessment/Plan    PT Assessment Patient needs continued PT services  PT Problem List Decreased strength;Decreased activity tolerance;Decreased balance;Decreased mobility;Pain       PT Treatment Interventions Gait training;Stair training;Functional mobility training;Therapeutic activities;Therapeutic exercise;Balance training;Patient/family education    PT Goals (Current goals can be found in the Care Plan section)  Acute Rehab PT Goals Patient Stated Goal: to go home PT Goal Formulation: With patient/family Time For Goal Achievement: 08/17/20 Potential to Achieve Goals: Good    Frequency Min 5X/week   Barriers to discharge        Co-evaluation               AM-PAC PT "6 Clicks" Mobility  Outcome Measure Help needed turning from your back to your side while in a flat bed without using bedrails?: A Little Help needed moving from lying on your back to sitting on the side of a flat bed without using bedrails?: A Little Help needed moving to and from a bed to a chair (including a wheelchair)?: A Little Help needed standing up from a chair using your arms (e.g., wheelchair or bedside chair)?: A Little Help needed to walk in hospital room?: A Little Help needed climbing 3-5 steps with a railing? : A Little 6 Click Score: 18    End of Session Equipment Utilized During Treatment: Gait belt;Back brace Activity Tolerance: Patient tolerated treatment well Patient left: in bed;with call bell/phone within reach;with family/visitor present Nurse Communication: Mobility status PT Visit Diagnosis: Unsteadiness on feet (R26.81);Other abnormalities of gait and mobility (R26.89);Muscle weakness (generalized) (M62.81)    Time: 3382-5053 PT Time Calculation (min) (ACUTE ONLY): 27  min   Charges:   PT Evaluation $PT Eval Low Complexity: 1 Low PT Treatments $Therapeutic Activity: 8-22 mins        Alec [name], PT, DPT Acute Rehabilitation Services Pager [phone] Office [phone]   Alec [name] 08/03/2020, 3:38 PM

## 2020-08-03 NOTE — Op Note (Signed)
PATIENT NAME: Alec [name] [facility]   MEDICAL RECORD NO.:   [id number]    DATE OF BIRTH: June 28, 1961   DATE OF PROCEDURE: 08/03/2020                               OPERATIVE REPORT   PREOPERATIVE DIAGNOSES: 1.  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  Status post previous L4-S1 fusion 3.  Bilateral leg numbness and weakness   POSTOPERATIVE DIAGNOSES: 1.  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  Status post previous L4-S1 fusion 3.  Bilateral leg numbness and weakness   PROCEDURE:  1.  Right-sided lateral interbody fusion, L3/4  via direct lateral retroperitoneal approach. 2.  Insertion of interbody device x1 (12 x 22 mm x 60 mm Alphatec intervertebral spacer). 3.  Placement of anterior instrumentation, L3/4 (size 10 plate) 4.  Use of morselized allograft -- ViviGen.   5.  Intraoperative use of fluoroscopy.   SURGEON:  Phylliss Bob, MD   ASSISTANT:  Pricilla Holm PA-C.   ANESTHESIA:  General endotracheal anesthesia.   COMPLICATIONS:  None.   DISPOSITION:  Stable.   ESTIMATED BLOOD LOSS:  Minimal.   INDICATIONS:  Briefly, [name] is a very pleasant 59 year old male who did present to me with deteriorating neurologic symptoms.  Specifically, he has had progressive bilateral leg numbness and weakness over the course of the last 6 months.  I did feel this was secondary to his severe spinal stenosis identified at L3-4.  No other prominent neurologic compression was noted throughout his lumbar spine, with the exception of the L3-4 level.  We did initially treat him nonoperatively, but his symptoms did progress, and became rather concerning to him.  We therefore did discuss proceeding with the surgery noted above.  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  We did elect to not proceed with addressing this, as this has been present for many years, and his back pain has been minimal.  We did instead elected to proceed with addressing his severe stenosis via  a lateral interbody fusion.  He does have diabetes requiring an insulin pump, which he does understand does increase his risk for an additional nonunion.   He did wish to proceed, after a full understanding of the risks and benefits of surgery.   DESCRIPTION OF PROCEDURE:  On 08/03/2020, the patient was brought to surgery and general endotracheal anesthesia was administered.  The patient was placed in the lateral decubitus position, with the right side up.  Neurologic monitoring leads were placed by the monitoring technician.  The patient's torso and lower extremities were secured to the bed.  The patient's hips and knees were flexed in order to lessen the tension on the psoas musculature.  The right flank was then prepped and draped in the usual sterile fashion. The bed was flexed, in order to optimize exposure to the L3/4 intervertebral space.  After a timeout procedure was performed, a right-sided transverse incision was made over the right flank overlying the L3/4 intervertebral space. The retroperitoneal space was encountered, after dissection through the oblique musculature.  The peritoneum was bluntly swept anteriorly, and the psoas was readily identified.  I did use a series of dilators to dock over the L3/4 intervertebral space.  I did use neurologic monitoring while placing the dilators, in order to ensure that there were no neurologic structures in the immediate vicinity of the  dilators.  The lumbar plexus was noted to be posterior.  A self-retaining retractor was placed, and was attached to a rigid arm.  The retractor was very gently dilated and a shim was placed into the L3/4 intervertebral space.  I then used a knife to perform an annulotomy at the lateral aspect of the L3/4 intervertebral space.  I then used a series of curettes and pituitary rongeurs in order to perform a thorough and complete L3/4 intervertebral diskectomy.  The contralateral annulus was released.  I then placed a series of  intervertebral spacer trials, and I did feel that a 12 x 22 mm x 60 mm spacer would be the most appropriate fit.  The appropriate spacer was then packed with ViviGen and tamped into position.  I was very pleased with the final resting position of the intervertebral spacer.  Excellent height restoration was noted on the right side, the side of the preoperative collapse. At this point, a size 10 plate was placed over the lateral aspect of the L3 and L4 vertebral bodies.  I then used an awl to prepare the trajectory of the L3 and L4 vertebral body screws.  A 55 mm screw was placed into the L3 vertebral body, and a 55 mm screw was placed into the L4 vertebral body.  The screws were then locked to the plate.  The break in the bed was removed and the bed was flattened and the plate was then locked.  I was very pleased with the final AP and lateral fluoroscopic images and the excellent restoration of disk height identified on both AP and lateral images.  At this point, the wound was copiously irrigated.  The fascia, internal, and external oblique musculature was closed using #1 Vicryl.  The subcutaneous layer was closed using 2-0 Vicryl and the skin was closed using 4-0 Monocryl.    Of note, I did use neurologic monitoring throughout the entire surgery, and there was no sustained EMG activity noted throughout the entire surgery. All instrument counts were correct at the termination of the procedure.   Of note, Pricilla Holm was my assistant throughout surgery, and did aid in retraction, placement of the hardware, suctioning, and closure.   Phylliss Bob, MD

## 2020-08-03 NOTE — Transfer of Care (Signed)
Immediate Anesthesia Transfer of Care Note  Patient: Alec [name]  Procedure(s) Performed: RIGHT LUMBAR THREE-LUMBAR FOUR LATERAL INTERBODY FUSION WITH INSTRUMENTATION AND ALLOGRAFT (Right Spine Lumbar)  Patient Location: PACU  Anesthesia Type:General  Level of Consciousness: drowsy and patient cooperative  Airway & Oxygen Therapy: Patient Spontanous Breathing and Patient connected to face mask oxygen  Post-op Assessment: Report given to RN and Post -op Vital signs reviewed and stable  Post vital signs: Reviewed and stable  Last Vitals:  Vitals Value Taken Time  BP 97/50 08/03/20 1045  Temp 37 C 08/03/20 1045  Pulse 107 08/03/20 1048  Resp 15 08/03/20 1048  SpO2 88 % 08/03/20 1048  Vitals shown include unvalidated device data.  Last Pain:  Vitals:   08/03/20 0648  TempSrc: Tympanic  PainSc:          Complications: No complications documented.

## 2020-08-03 NOTE — Anesthesia Procedure Notes (Signed)
Procedure Name: Intubation Date/Time: 08/03/2020 7:43 AM Performed by: Lance Coon, CRNA Pre-anesthesia Checklist: Patient identified, Emergency Drugs available, Suction available, Patient being monitored and Timeout performed Patient Re-evaluated:Patient Re-evaluated prior to induction Oxygen Delivery Method: Circle system utilized Preoxygenation: Pre-oxygenation with 100% oxygen Induction Type: IV induction Ventilation: Mask ventilation without difficulty Laryngoscope Size: Miller and 3 Grade View: Grade I Tube type: Oral Tube size: 7.5 mm Number of attempts: 1 Airway Equipment and Method: Stylet Placement Confirmation: ETT inserted through vocal cords under direct vision,  positive ETCO2 and breath sounds checked- equal and bilateral Secured at: 22 cm Tube secured with: Tape Dental Injury: Teeth and Oropharynx as per pre-operative assessment

## 2020-08-03 NOTE — Progress Notes (Signed)
Pt. Has insulin pump, U500 at 20% infusing 1.55 units/hour.

## 2020-08-03 NOTE — H&P (Signed)
PREOPERATIVE H&P  Chief Complaint: Bilateral leg numbness  HPI: Alec [name] is a 59 y.o. male who presents with ongoing pain in the legs in addition to leg numbness and weakness  MRI reveals severe spinal stenosis at L3/4, the level above the patient's previous operative levels  Patient has failed multiple forms of conservative care and continues to have pain (see office notes for additional details regarding the patient's full course of treatment)  Past Medical History:  Diagnosis Date  . Arthritis   . CAD (coronary artery disease)    a. 04/27/14 DES to dRCA; widely patent L coronary system b. cath 08/13/2015 no changes, patent RCA  . Chronic lower back pain   . Depression   . Gout   . History of echocardiogram    a. 04/27/14 with EF 65-70% and mild TR.  Marland Kitchen History of pancreatitis   . Metabolic syndrome   . Mixed hyperlipidemia   . Obesity (BMI 30-39.9) 02/16/2016  . OSA (obstructive sleep apnea) 11/26/2015   Severe with total AHI 104.8/hr and oxygen desaturations as low as 79%.  On CPAP at 13cm H2O  . Type 2 diabetes mellitus (HCC)    on Insulin pump   Past Surgical History:  Procedure Laterality Date  . CARDIAC CATHETERIZATION N/A 08/14/2015   Procedure: Left Heart Cath and Coronary Angiography;  Surgeon: Belva Crome, MD;  Location: [place] [facility];  Service: Cardiovascular;  Laterality: N/A;  . CARPAL TUNNEL RELEASE Bilateral 2006-2008   left 12/2004; right 10/2006  . CHONDROPLASTY  04/20/2015   Procedure: CHONDROPLASTY;  Surgeon: Melrose Nakayama, MD;  Location: [place];  Service: Orthopedics;;  . FUNCTIONAL ENDOSCOPIC SINUS SURGERY  10/2001; 08/2005;   . KNEE ARTHROSCOPY Bilateral 2000's   right 08/2003;   . KNEE ARTHROSCOPY WITH MEDIAL MENISECTOMY Left 04/20/2015   Procedure: LEFT ARTHROSCOPY KNEE WITH MEDIAL MENISECTOMY;  Surgeon: Melrose Nakayama, MD;  Location: [place];  Service: Orthopedics;  Laterality: Left;  Partial medial  menisectomy, chondroplasty.  Marland Kitchen LEFT HEART CATHETERIZATION WITH CORONARY ANGIOGRAM N/A 04/27/2014   Procedure: LEFT HEART CATHETERIZATION WITH CORONARY ANGIOGRAM;  Surgeon: [name], MD;  Location: [facility] CATH [facility];  Service: Cardiovascular;  Laterality: N/A;  . LUMBAR LAMINECTOMY Left 05/2006  . PERCUTANEOUS STENT INTERVENTION  04/27/2014   Procedure: PERCUTANEOUS STENT INTERVENTION;  Surgeon: [name], MD;  Location: [facility] C[facility];  Service: Cardiovascular;;  DES x2 (distal +prox RCA)  . POSTERIOR LUMBAR FUSION  07/2009  . SHOULDER ARTHROSCOPY Right ~ 2010   Social History   Socioeconomic History  . Marital status: Married    Spouse name: Not on file  . Number of children: 2  . Years of education: Not on file  . Highest education level: Not on file  Occupational History  . Occupation: Actuary  Tobacco Use  . Smoking status: Passive Smoke Exposure - Never Smoker  . Smokeless tobacco: Never Used  . Tobacco comment: tried for less than a year when 16  Vaping Use  . Vaping Use: Never used  Substance and Sexual Activity  . Alcohol use: Yes    Comment: occ  . Drug use: No  . Sexual activity: Yes  Other Topics Concern  . Not on file  Social History Narrative  . Not on file   Social Determinants of Health   Financial Resource Strain:   . Difficulty of Paying Living Expenses: Not on file  Food Insecurity:   .  Worried About Charity fundraiser in the Last Year: Not on file  . Ran Out of Food in the Last Year: Not on file  Transportation Needs:   . Lack of Transportation (Medical): Not on file  . Lack of Transportation (Non-Medical): Not on file  Physical Activity:   . Days of Exercise per Week: Not on file  . Minutes of Exercise per Session: Not on file  Stress:   . Feeling of Stress : Not on file  Social Connections:   . Frequency of Communication with Friends and Family: Not on file  . Frequency of Social Gatherings with Friends and Family: Not on file  .  Attends Religious Services: Not on file  . Active Member of Clubs or Organizations: Not on file  . Attends Archivist Meetings: Not on file  . Marital Status: Not on file   Family History  Problem Relation Age of Onset  . Coronary artery disease Mother        MI in her 86's  . Heart attack Mother   . Diabetes Mother   . Emphysema Mother   . Prostate cancer Father   . Lung cancer Father   . Diabetes Father   . Asthma Son   . Colon cancer Neg Hx    Allergies  Allergen Reactions  . Percocet [Oxycodone-Acetaminophen] Nausea Only    Makes patient feel not with it   Prior to Admission medications   Medication Sig Start Date End Date Taking? Authorizing Provider  Alirocumab (PRALUENT) 150 MG/ML SOAJ Inject 150 mg into the skin every 14 (fourteen) days. Every other Sunday   Yes [provider]  allopurinol (ZYLOPRIM) 300 MG tablet Take 1 tablet (300 mg total) by mouth daily. 04/28/14  Yes Eileen Stanford, PA-C  atorvastatin (LIPITOR) 80 MG tablet Take 80 mg by mouth at bedtime. 07/28/15  Yes [provider]  cetirizine (ZYRTEC) 10 MG tablet Take 10 mg by mouth daily.   Yes [provider]  Empagliflozin (JARDIANCE) 25 MG TABS Take 25 mg by mouth daily.   Yes [provider]  escitalopram (LEXAPRO) 20 MG tablet TAKE 1 TABLET (20 MG) BY MOUTH DAILY. Patient taking differently: Take 20 mg by mouth daily.  06/23/15  Yes Belva Crome, MD  fenofibrate 160 MG tablet Take 1 tablet (160 mg total) by mouth daily. 12/29/19  Yes Belva Crome, MD  HYDROcodone-acetaminophen [facility]) 10-325 MG tablet Take 1 tablet by mouth every 6 (six) hours as needed for moderate pain.   Yes [provider]  insulin regular human CONCENTRATED (HUMULIN R) 500 UNIT/ML SOLN injection Inject into the skin continuous. Max 2 units per hour via insulin pump, bolus as needed blood suger   Yes [provider]  metoprolol succinate (TOPROL-XL) 25 MG 24 hr tablet  Take 25 mg by mouth daily. 07/28/15  Yes [provider]  montelukast (SINGULAIR) 10 MG tablet Take 10 mg by mouth at bedtime as needed (allergies).  07/28/15  Yes [provider]  naproxen sodium (ANAPROX) 220 MG tablet Take 440 mg by mouth daily as needed (pain).   Yes [provider]  pregabalin (LYRICA) 50 MG capsule Take 50 mg by mouth 2 (two) times daily. 10/02/18  Yes [provider]  ramipril (ALTACE) 10 MG capsule Take 10 mg by mouth daily.  07/28/15  Yes [provider]  VASCEPA 1 G CAPS Take 2 g by mouth 2 (two) times daily.  03/03/14  Yes  [provider]  aspirin EC 81 MG tablet Take 81 mg by mouth daily.    [provider]  NITROSTAT 0.4 MG SL tablet PLACE 1 TABLET (0.4 MG TOTAL) UNDER THE TONGUE EVERY 5 (FIVE) MINUTES X 3 DOSES AS NEEDED FOR CHEST PAIN. DIAL 911 AFTER 3 DOSES Patient taking differently: Place 0.4 mg under the tongue every 5 (five) minutes as needed for chest pain.  08/16/15   Eileen Stanford, PA-C  ONE TOUCH ULTRA TEST test strip 1 each by Other route as needed for other (blood sugar).  08/16/15   [provider]  sildenafil (VIAGRA) 100 MG tablet Take 1 tablet (100 mg total) by mouth daily as needed for erectile dysfunction. 12/23/19   Belva Crome, MD  valACYclovir (VALTREX) 1000 MG tablet Take 1,000 mg by mouth daily as needed (cold sores).  10/02/18   [provider]     All other systems have been reviewed and were otherwise negative with the exception of those mentioned in the HPI and as above.  Physical Exam: Vitals:   08/03/20 0648  BP: (!) 164/100  Pulse: 78  Resp: 18  Temp: 98.1 F (36.7 C)  SpO2: 95%    There is no height or weight on file to calculate BMI.  General: Alert, no acute distress Cardiovascular: No pedal edema Respiratory: No cyanosis, no use of accessory musculature Skin: No lesions in the area of chief complaint Neurologic: Sensation intact  distally Psychiatric: Patient is competent for consent with normal mood and affect Lymphatic: No axillary or cervical lymphadenopathy  Assessment/Plan: BILATERAL LEG NUMBNESS Plan for Procedure(s): RIGHT LUMBAR 3-LUMBAR 4 LATERAL INTERBODY FUSION WITH INSTRUMENTATION AND ALLOGRAFT   Norva Karvonen, MD 08/03/2020 7:12 AM

## 2020-08-04 ENCOUNTER — Encounter (HOSPITAL_COMMUNITY): Payer: Self-pay | Admitting: Orthopedic Surgery

## 2020-08-04 ENCOUNTER — Other Ambulatory Visit (HOSPITAL_COMMUNITY): Payer: Self-pay | Admitting: Orthopedic Surgery

## 2020-08-04 LAB — GLUCOSE, CAPILLARY
Glucose-Capillary: 240 mg/dL — ABNORMAL HIGH (ref 70–99)
Glucose-Capillary: 135 mg/dL — ABNORMAL HIGH (ref 70–99)

## 2020-08-04 MED ORDER — METHOCARBAMOL 500 MG PO TABS: 500.0000 mg | ORAL_TABLET | Freq: Four times a day (QID) | ORAL | 1 refills | Status: DC | PRN

## 2020-08-04 MED ORDER — OXYCODONE-ACETAMINOPHEN 5-325 MG PO TABS
1.0000 | ORAL_TABLET | ORAL | 0 refills | Status: DC | PRN
Start: 2020-08-04 — End: 2020-08-04

## 2020-08-04 MED FILL — OXYCODONE-ACETAMINOPHEN 5-3: 5-325 | 4 days supply | Qty: 30 | Fill #0

## 2020-08-04 MED FILL — METHOCARBAMOL 500 MG TABS: 500 | 7 days supply | Qty: 30 | Fill #0

## 2020-08-04 NOTE — Progress Notes (Signed)
Inpatient Diabetes Program Recommendations  AACE/ADA: New Consensus Statement on Inpatient Glycemic Control (2015)  Target Ranges:  Prepandial:   less than 140 mg/dL      Peak postprandial:   less than 180 mg/dL (1-2 hours)      Critically ill patients:  140 - 180 mg/dL   Results for Alec [name], Alec [name] (MRN [id number]) as of 08/04/2020 06:20  Ref. Range 08/03/2020 06:46 08/03/2020 08:24 08/03/2020 09:30 08/03/2020 10:47 08/03/2020 17:08 08/03/2020 21:03 08/04/2020 02:10  Glucose-Capillary Latest Ref Range: 70 - 99 mg/dL 92 98  10 mg Decadron given 99 155 (H) 278 (H)  3.6 units per Home Insulin Pump 274 (H)  7.3 units per Home Insulin Pump 240 (H)  2.7 units per Home Insulin Pump   Admit with: Severe L3-4 spinal stenosis  History: DM  Home DM Meds: Insulin Pump  Current Orders: Insulin Pump      Novolog Moderate Correction Scale/ SSI (0-15 units) TID AC     MD- Note patient managing CBGs with home insulin pump per orders.  Please discontinue the Novolog 0-15 unit SSI orders  Pt will continue to dose insulin per home insulin pump    --Will follow patient during hospitalization--  Wyn Quaker RN, MSN, CDE Diabetes Coordinator Inpatient Glycemic Control Team Team Pager: [phone] (8a-5p)

## 2020-08-04 NOTE — Evaluation (Signed)
Occupational Therapy Evaluation/Discharge Patient Details Name: Alec [name] MRN: [id number] DOB: 1961-08-25 Today's Date: 08/04/2020    History of Present Illness Patient is a 59 y/o male with ongoing pain, numbness and weakness in legs. PMH includes CAD, gout, metabolic syndrome, OSA, and Type II DM on insulin pump. Patient s/p R sided lumbar fusion of L3-4.   Clinical Impression   PTA, pt lives with family and reports Independence in all daily tasks though limited by pain, impaired sensation and weakness. Pt presents now with improving sensation and pain levels but continues with mild deficits in strength. Pt reports hx of other spinal surgeries and familiar with spinal precautions. Pt able to demonstrate standing ADLs, managing brace and hallway mobility Independently. Pt with difficulty reaching B feet for managing socks/shoes, but anticipate able to manage pants/underwear without difficulty with plans to wear slip on shoes in the home. Pt reports his family will assist with IADL completion initially and no further skilled OT services indicated at acute level or on discharge. OT to sign off.     Follow Up Recommendations  No OT follow up    Equipment Recommendations  None recommended by OT    Recommendations for Other Services       Precautions / Restrictions Precautions Precautions: Back;Fall Precaution Booklet Issued: Yes (comment) Required Braces or Orthoses: Spinal Brace Spinal Brace: Thoracolumbosacral orthotic Restrictions Weight Bearing Restrictions: No      Mobility Bed Mobility Overal bed mobility: Needs Assistance Bed Mobility: Rolling;Sidelying to Sit Rolling: Independent Sidelying to sit: Independent       General bed mobility comments: Independent, no assist needed and good adherence with log rolling    Transfers Overall transfer level: Independent Equipment used: None Transfers: Sit to/from Stand Sit to Stand: Independent         General  transfer comment: no assist needed    Balance Overall balance assessment: Needs assistance Sitting-balance support: No upper extremity supported;Feet supported Sitting balance-Leahy Scale: Normal     Standing balance support: No upper extremity supported;During functional activity Standing balance-Leahy Scale: Good                             ADL either performed or assessed with clinical judgement   ADL Overall ADL's : Needs assistance/impaired Eating/Feeding: Independent;Sitting   Grooming: Independent;Standing;Oral care Grooming Details (indicate cue type and reason): able to demo brushing teeth while minding back precautions well Upper Body Bathing: Independent;Sitting   Lower Body Bathing: Independent;Sitting/lateral leans;Sit to/from stand   Upper Body Dressing : Independent;Sitting Upper Body Dressing Details (indicate cue type and reason): able to don/doff TLSO brace without assist Lower Body Dressing: Minimal assistance;Sit to/from stand;Sitting/lateral leans Lower Body Dressing Details (indicate cue type and reason): Pt with difficulty reaching feet to adjust socks (difficulty prior to surgery as well). Pt able to demo half sitting EOB with LE propped up for improved reach. Educated on easier clothing to don/manage. Anticipate pt able to don pants, underwear without difficulty but will need assistance with socks. Pt reports typically wearing slip on shoes in the house Toilet Transfer: Independent;BSC;Regular Toilet;Ambulation Toilet Transfer Details (indicate cue type and reason): simulated  Toileting- Clothing Manipulation and Hygiene: Independent;Sit to/from stand       Functional mobility during ADLs: Independent General ADL Comments: Pt with minor limitations due to difficulty reaching B feet (hx of knee issues on R LE, hip pain on L side).      Vision Baseline  Vision/History: No visual deficits Patient Visual Report: No change from baseline Vision  Assessment?: No apparent visual deficits     Perception     Praxis      Pertinent Vitals/Pain Pain Assessment: Faces Faces Pain Scale: Hurts a little bit Pain Location: low back Pain Descriptors / Indicators: Grimacing;Guarding Pain Intervention(s): Monitored during session     Hand Dominance Right   Extremity/Trunk Assessment Upper Extremity Assessment Upper Extremity Assessment: Overall WFL for tasks assessed   Lower Extremity Assessment Lower Extremity Assessment: Defer to PT evaluation   Cervical / Trunk Assessment Cervical / Trunk Assessment: Normal   Communication Communication Communication: No difficulties   Cognition Arousal/Alertness: Awake/alert Behavior During Therapy: WFL for tasks assessed/performed Overall Cognitive Status: Within Functional Limits for tasks assessed                                     General Comments  Educated on ADL/IADL safety (handout provided). Pt able to demo hallway mobility with slow pace but no LOB. Pt with questions about being able to manage steps into home. Pt able to demo holding to bed rail and lifitng B LE high enough for 7" step clearance.- encouraged to use handrails and take time with steps - deferred to PT    Exercises     Shoulder Instructions      Home Living Family/patient expects to be discharged to:: Private residence Living Arrangements: Spouse/significant other;Children (19, 18 y/o children) Available Help at Discharge: Family;Available PRN/intermittently (wife works) Type of Home: House Home Access: Stairs to enter Technical brewer of Steps: 5 Entrance Stairs-Rails: Can reach both [place]le: One level     Bathroom Shower/Tub: Occupational psychologist: Handicapped height     Home Equipment: Environmental consultant - 4 wheels;Shower seat - built in;Bedside commode;Hand held shower head;Adaptive equipment Adaptive Equipment: Reacher        Prior Functioning/Environment Level of  Independence: Independent                 OT Problem List:        OT Treatment/Interventions:      OT Goals(Current goals can be found in the care plan section) Acute Rehab OT Goals Patient Stated Goal: go home today   OT Frequency:     Barriers to D/C:            Co-evaluation              AM-PAC OT "6 Clicks" Daily Activity     Outcome Measure Help from another person eating meals?: None Help from another person taking care of personal grooming?: None Help from another person toileting, which includes using toliet, bedpan, or urinal?: None Help from another person bathing (including washing, rinsing, drying)?: None Help from another person to put on and taking off regular upper body clothing?: None Help from another person to put on and taking off regular lower body clothing?: A Little 6 Click Score: 23   End of Session Equipment Utilized During Treatment: Back brace Nurse Communication: Mobility status  Activity Tolerance: Patient tolerated treatment well Patient left: in bed;with call bell/phone within reach                   Time: 7619-5093 OT Time Calculation (min): 21 min Charges:  OT General Charges $OT Visit: 1 Visit OT Evaluation $OT Eval Low Complexity: 1 Low  Layla Maw, OTR/L  Layla Maw 08/04/2020, 8:10 AM

## 2020-08-04 NOTE — Progress Notes (Addendum)
Physical Therapy Treatment and Discharge Patient Details Name: Alec [name] MRN: [id number] DOB: [birth year] Today's Date: 08/04/2020    History of Present Illness Patient is a 59 y/o male with ongoing pain, numbness and weakness in legs. PMH includes CAD, gout, metabolic syndrome, OSA, and Type II DM on insulin pump. Patient s/p R sided lumbar fusion of L3-4.    PT Comments    Pt progressing well with post-op mobility. He was able to demonstrate transfers and ambulation with gross modified independence and no AD. Pt was educated on precautions, brace application/wearing schedule, appropriate activity progression, and car transfer. Brace was adjusted for optimal fit. Pt anticipates d/c home today, and has met acute PT goals. Will sign off at this time. If needs change, please reconsult.     Follow Up Recommendations  No PT follow up;Supervision for mobility/OOB     Equipment Recommendations  None recommended by PT    Recommendations for Other Services       Precautions / Restrictions Precautions Precautions: Back;Fall Precaution Booklet Issued: Yes (comment) Required Braces or Orthoses: Spinal Brace Spinal Brace: Thoracolumbosacral orthotic Restrictions Weight Bearing Restrictions: No    Mobility  Bed Mobility Overal bed mobility: Needs Assistance Bed Mobility: Rolling;Sidelying to Sit Rolling: Independent Sidelying to sit: Independent       General bed mobility comments: Pt was received sitting up EOB. Verbally reviewed log roll technique.   Transfers Overall transfer level: Modified independent Equipment used: None Transfers: Sit to/from Stand Sit to Stand: Independent         General transfer comment: Increased time due to pain. No assist required.   Ambulation/Gait Ambulation/Gait assistance: Modified independent (Device/Increase time) Gait Distance (Feet): 400 Feet Assistive device: None Gait Pattern/deviations: Step-through pattern;Decreased stride  length;Wide base of support Gait velocity: decreased Gait velocity interpretation: <1.31 ft/sec, indicative of household ambulator General Gait Details: No assist required. Pt with no unsteadiness or overt LOB.    Stairs Stairs: Yes Stairs assistance: Supervision Stair Management: One rail Right;Alternating pattern;Forwards Number of Stairs: 10 General stair comments: VC's for general safety. No assist required.    Wheelchair Mobility    Modified Rankin (Stroke Patients Only)       Balance Overall balance assessment: Needs assistance Sitting-balance support: No upper extremity supported;Feet supported Sitting balance-Leahy Scale: Normal     Standing balance support: No upper extremity supported;During functional activity Standing balance-Leahy Scale: Fair                              Cognition Arousal/Alertness: Awake/alert Behavior During Therapy: WFL for tasks assessed/performed Overall Cognitive Status: Within Functional Limits for tasks assessed                                        Exercises      General Comments General comments (skin integrity, edema, etc.): Educated on ADL/IADL safety (handout provided). Pt able to demo hallway mobility with slow pace but no LOB. Pt with questions about being able to manage steps into home. Pt able to demo holding to bed rail and lifitng B LE high enough for 7" step clearance.- encouraged to use handrails and take time with steps - deferred to PT      Pertinent Vitals/Pain Pain Assessment: Faces Faces Pain Scale: Hurts a little bit Pain Location: low back Pain Descriptors / Indicators: Grimacing;Guarding Pain  Intervention(s): Limited activity within patient's tolerance;Monitored during session;Repositioned    Home Living Family/patient expects to be discharged to:: Private residence Living Arrangements: Spouse/significant other;Children (19, 11 y/o children) Available Help at Discharge:  Family;Available PRN/intermittently (wife works) Type of Home: House Home Access: Stairs to enter Entrance Stairs-Rails: Can reach both Home Layout: One level Home Equipment: Environmental consultant - 4 wheels;Shower seat - built in;Bedside commode;Hand held shower head;Adaptive equipment      Prior Function Level of Independence: Independent          PT Goals (current goals can now be found in the care plan section) Acute Rehab PT Goals Patient Stated Goal: go home today  PT Goal Formulation: With patient/family Time For Goal Achievement: 08/17/20 Potential to Achieve Goals: Good Progress towards PT goals: Progressing toward goals    Frequency    Min 5X/week      PT Plan Current plan remains appropriate    Co-evaluation              AM-PAC PT "6 Clicks" Mobility   Outcome Measure  Help needed turning from your back to your side while in a flat bed without using bedrails?: None Help needed moving from lying on your back to sitting on the side of a flat bed without using bedrails?: None Help needed moving to and from a bed to a chair (including a wheelchair)?: None Help needed standing up from a chair using your arms (e.g., wheelchair or bedside chair)?: None Help needed to walk in hospital room?: None Help needed climbing 3-5 steps with a railing? : None 6 Click Score: 24    End of Session Equipment Utilized During Treatment: Back brace Activity Tolerance: Patient tolerated treatment well Patient left: in bed;with call bell/phone within reach;with family/visitor present (EOB) Nurse Communication: Mobility status PT Visit Diagnosis: Unsteadiness on feet (R26.81);Other abnormalities of gait and mobility (R26.89);Muscle weakness (generalized) (M62.81)     Time: 1610-9604 PT Time Calculation (min) (ACUTE ONLY): 18 min  Charges:  $Gait Training: 8-22 mins                     Rolinda Roan, PT, DPT Acute Rehabilitation Services Pager: [phone] Office: [phone]     Thelma Comp 08/04/2020, 10:16 AM

## 2020-08-04 NOTE — Progress Notes (Signed)
Patient alert and oriented, mae's well, voiding adequate amount of urine, swallowing without difficulty, no c/o pain at time of discharge. Patient discharged home with family. Script and discharged instructions given to patient. Patient and family stated understanding of instructions given. Patient has an appointment with Dr. Dumonski 

## 2020-08-04 NOTE — Progress Notes (Signed)
    Patient doing well  Reports improvement in preop numbness Has been ambualting   Physical Exam: Vitals:   08/04/20 0422 08/04/20 0729  BP: 131/79 124/70  Pulse: 82 83  Resp: 18 18  Temp: 97.7 F (36.5 C) 97.7 F (36.5 C)  SpO2: 96% 93%    Dressing in place NVI  POD #1 s/p L3/4 lateral fusion, doing well  - up with PT/OT, encourage ambulation - Percocet for pain, Robaxin for muscle spasms - likely d/c home today with f/u in 2 weeks

## 2020-08-04 NOTE — Anesthesia Postprocedure Evaluation (Signed)
Anesthesia Post Note  Patient: Alec [name]  Procedure(s) Performed: RIGHT LUMBAR THREE-LUMBAR FOUR LATERAL INTERBODY FUSION WITH INSTRUMENTATION AND ALLOGRAFT (Right Spine Lumbar)     Patient location during evaluation: PACU Anesthesia Type: General Level of consciousness: awake and alert Pain management: pain level controlled Vital Signs Assessment: post-procedure vital signs reviewed and stable Respiratory status: spontaneous breathing, nonlabored ventilation, respiratory function stable and patient connected to nasal cannula oxygen Cardiovascular status: blood pressure returned to baseline and stable Postop Assessment: no apparent nausea or vomiting Anesthetic complications: no   No complications documented.  Last Vitals:  Vitals:   08/04/20 0422 08/04/20 0729  BP: 131/79 124/70  Pulse: 82 83  Resp: 18 18  Temp: 36.5 C 36.5 C  SpO2: 96% 93%    Last Pain:  Vitals:   08/04/20 0625  TempSrc:   PainSc: 6                  [name]

## 2020-08-15 ENCOUNTER — Other Ambulatory Visit (HOSPITAL_BASED_OUTPATIENT_CLINIC_OR_DEPARTMENT_OTHER): Payer: Self-pay | Admitting: Internal Medicine

## 2020-08-15 MED FILL — HYDROCODON-APAP 10-325: 10-325 | 30 days supply | Qty: 120 | Fill #0

## 2020-08-16 ENCOUNTER — Other Ambulatory Visit (HOSPITAL_BASED_OUTPATIENT_CLINIC_OR_DEPARTMENT_OTHER): Payer: Self-pay | Admitting: Orthopedic Surgery

## 2020-08-16 MED FILL — CYCLOBENZAPRINE HCL 10 MG T: 10 | 30 days supply | Qty: 60 | Fill #0

## 2020-08-16 MED FILL — METHOCARBAMOL 500 MG TABS: 500 | 15 days supply | Qty: 60 | Fill #0

## 2020-08-18 NOTE — Discharge Summary (Signed)
Patient ID: Alec [name] MRN: [id number] DOB/AGE: 58/02/1961 58 y.o.  Admit date: 08/03/2020 Discharge date: 08/04/2020  Admission Diagnoses:  Active Problems:   Neurogenic claudication [facility])   Discharge Diagnoses:  Same  Past Medical History:  Diagnosis Date  . Arthritis   . CAD (coronary artery disease)    a. 04/27/14 DES to dRCA; widely patent L coronary system b. cath 08/13/2015 no changes, patent RCA  . Chronic lower back pain   . Depression   . Gout   . History of echocardiogram    a. 04/27/14 with EF 65-70% and mild TR.  Marland Kitchen History of pancreatitis   . Metabolic syndrome   . Mixed hyperlipidemia   . Obesity (BMI 30-39.9) 02/16/2016  . OSA (obstructive sleep apnea) 11/26/2015   Severe with total AHI 104.8/hr and oxygen desaturations as low as 79%.  On CPAP at 13cm H2O  . Type 2 diabetes mellitus ([place])    on Insulin pump    Surgeries: Procedure(s): RIGHT LUMBAR THREE-LUMBAR FOUR LATERAL INTERBODY FUSION WITH INSTRUMENTATION AND ALLOGRAFT on 08/03/2020   Consultants: None  Discharged Condition: Improved  Hospital Course: Alec [name] is an 59 y.o. male who was admitted 08/03/2020 for operative treatment of spinal stenosis. Patient has severe unremitting pain that affects sleep, daily activities, and work/hobbies. After pre-op clearance the patient was taken to the operating room on 08/03/2020 and underwent  Procedure(s): RIGHT LUMBAR [place] INTERBODY FUSION WITH INSTRUMENTATION AND ALLOGRAFT.    Patient was given perioperative antibiotics:  Anti-infectives (From admission, onward)   Start     Dose/Rate Route Frequency Ordered Stop   08/03/20 1600  ceFAZolin (ANCEF) IVPB 2g/100 mL premix        2 g 200 mL/hr over 30 Minutes Intravenous Every 8 hours 08/03/20 1436 08/03/20 2328   08/03/20 1435  valACYclovir (VALTREX) tablet 1,000 mg  Status:  Discontinued        1,000 mg Oral Daily PRN 08/03/20 1435 08/04/20 1639   08/03/20 0715  ceFAZolin  (ANCEF) 3 g in dextrose 5 % 50 mL IVPB  Status:  Discontinued        3 g 100 mL/hr over 30 Minutes Intravenous On call to O.R. 08/02/20 1108 08/03/20 1422       Patient was given sequential compression devices, early ambulation to prevent DVT.  Patient benefited maximally from hospital stay and there were no complications.    Recent vital signs: BP 124/70 (BP Location: Right Arm)   Pulse 83   Temp 97.7 F (36.5 C)   Resp 18   SpO2 93%    Discharge Medications:   Allergies as of 08/04/2020      Reactions   Percocet [oxycodone-acetaminophen] Nausea Only   Makes patient feel not with it      Medication List    TAKE these medications   allopurinol 300 MG tablet Commonly known as: ZYLOPRIM Take 1 tablet (300 mg total) by mouth daily.   atorvastatin 80 MG tablet Commonly known as: LIPITOR Take 80 mg by mouth at bedtime.   cetirizine 10 MG tablet Commonly known as: ZYRTEC Take 10 mg by mouth daily.   escitalopram 20 MG tablet Commonly known as: LEXAPRO TAKE 1 TABLET (20 MG) BY MOUTH DAILY. What changed: See the new instructions.   fenofibrate 160 MG tablet Take 1 tablet (160 mg total) by mouth daily.   HUMULIN R 500 UNIT/ML injection Generic drug: insulin regular human CONCENTRATED Inject into the skin continuous. Max  2 units per hour via insulin pump, bolus as needed blood suger   Jardiance 25 MG Tabs tablet Generic drug: empagliflozin Take 25 mg by mouth daily.   methocarbamol 500 MG tablet Commonly known as: ROBAXIN Take 1 tablet (500 mg total) by mouth every 6 (six) hours as needed for muscle spasms.   metoprolol succinate 25 MG 24 hr tablet Commonly known as: TOPROL-XL Take 25 mg by mouth daily.   montelukast 10 MG tablet Commonly known as: SINGULAIR Take 10 mg by mouth at bedtime as needed (allergies).   Nitrostat 0.4 MG SL tablet Generic drug: nitroGLYCERIN PLACE 1 TABLET (0.4 MG TOTAL) UNDER THE TONGUE EVERY 5 (FIVE) MINUTES X 3 DOSES AS NEEDED  FOR CHEST PAIN. DIAL 911 AFTER 3 DOSES What changed: See the new instructions.   ONE TOUCH ULTRA TEST test strip Generic drug: glucose blood 1 each by Other route as needed for other (blood sugar).   oxyCODONE-acetaminophen 5-325 MG tablet Commonly known as: PERCOCET/ROXICET Take 1-2 tablets by mouth every 4 (four) hours as needed for moderate pain or severe pain.   Praluent 150 MG/ML [place] Generic drug: Alirocumab Inject 150 mg into the skin every 14 (fourteen) days. Every other Sunday   pregabalin 50 MG capsule Commonly known as: LYRICA Take 50 mg by mouth 2 (two) times daily.   ramipril 10 MG capsule Commonly known as: ALTACE Take 10 mg by mouth daily.   sildenafil 100 MG tablet Commonly known as: Viagra Take 1 tablet (100 mg total) by mouth daily as needed for erectile dysfunction.   valACYclovir 1000 MG tablet Commonly known as: VALTREX Take 1,000 mg by mouth daily as needed (cold sores).   Vascepa 1 g capsule Generic drug: icosapent Ethyl Take 2 g by mouth 2 (two) times daily.       Diagnostic Studies: DG Lumbar Spine 2-3 Views  Result Date: 08/03/2020 CLINICAL DATA:  Evaluate for retained foreign body/surgical instrument. Lumbar spine fusion. EXAM: DG C-ARM 1-60 MIN; LUMBAR SPINE - 2-3 VIEW COMPARISON:  Lumbar MRI, 01/24/2020. FINDINGS: No evidence of a retained surgical instrument or other unexpected radiopaque foreign body. Right lateral fusion plate spans the E0-C1 disc interspace with well-seated screws. Previous pedicle screws interconnecting rods extend from L4 through S1. IMPRESSION: 1. No evidence of a retained foreign body/surgical instrument. Results were called to the OR nurse at the time of this dictation. Electronically Signed   By: Lajean Manes M.D.   On: 08/03/2020 10:52   DG C-Arm 1-60 Min  Result Date: 08/03/2020 CLINICAL DATA:  Evaluate for retained foreign body/surgical instrument. Lumbar spine fusion. EXAM: DG C-ARM 1-60 MIN; LUMBAR SPINE - 2-3  VIEW COMPARISON:  Lumbar MRI, 01/24/2020. FINDINGS: No evidence of a retained surgical instrument or other unexpected radiopaque foreign body. Right lateral fusion plate spans the K4-Y1 disc interspace with well-seated screws. Previous pedicle screws interconnecting rods extend from L4 through S1. IMPRESSION: 1. No evidence of a retained foreign body/surgical instrument. Results were called to the OR nurse at the time of this dictation. Electronically Signed   By: Lajean Manes M.D.   On: 08/03/2020 10:52    Disposition: Discharge disposition: 01-Home or Self Care        POD #1 s/p L3/4 lateral fusion, doing well  - up with PT/OT, encourage ambulation - Percocet for pain, Robaxin for muscle spasms -Scripts for pain sent to pharmacy electronically  -D/C instructions sheet printed and in chart -D/C today  -F/U in office 2 weeks  SignedLennie Muckle Ethin Drummond 08/18/2020, 7:58 AM

## 2020-08-22 ENCOUNTER — Other Ambulatory Visit (HOSPITAL_BASED_OUTPATIENT_CLINIC_OR_DEPARTMENT_OTHER): Payer: Self-pay | Admitting: Endocrinology

## 2020-08-22 MED FILL — CONTOUR NEXT STRIPS: 30 days supply | Qty: 300 | Fill #0

## 2020-08-29 ENCOUNTER — Other Ambulatory Visit (HOSPITAL_BASED_OUTPATIENT_CLINIC_OR_DEPARTMENT_OTHER): Payer: Self-pay | Admitting: Endocrinology

## 2020-08-29 MED FILL — ALLOPURINOL 300 MG TABLET: 300 | 30 days supply | Qty: 30 | Fill #3

## 2020-08-29 MED FILL — METOPROLOL SUCCINATE ER 25: 25 | 30 days supply | Qty: 30 | Fill #5

## 2020-08-29 MED FILL — VASCEPA 1 GM CAPSULE: 1 | 30 days supply | Qty: 120 | Fill #3

## 2020-08-29 MED FILL — RAMIPRIL 10 MG CAPSULE: 10 | 90 days supply | Qty: 90 | Fill #0

## 2020-08-29 MED FILL — PREGABALIN 50 MG CAPS: 50 | 30 days supply | Qty: 60 | Fill #3

## 2020-08-31 ENCOUNTER — Other Ambulatory Visit (HOSPITAL_BASED_OUTPATIENT_CLINIC_OR_DEPARTMENT_OTHER): Payer: Self-pay | Admitting: Endocrinology

## 2020-08-31 MED FILL — FARXIGA 10 MG TABLET: 10 | 30 days supply | Qty: 30 | Fill #0

## 2020-08-31 MED FILL — CONTOUR NEXT STRIPS: 30 days supply | Qty: 300 | Fill #0

## 2020-08-31 MED FILL — HUMULIN R 500 UNITS/ML VIAL: 500 | 23 days supply | Qty: 20 | Fill #11

## 2020-09-12 ENCOUNTER — Other Ambulatory Visit (HOSPITAL_COMMUNITY): Payer: Self-pay | Admitting: Internal Medicine

## 2020-09-13 MED FILL — HYDROCODON-APAP 10-325: 10-325 | 30 days supply | Qty: 120 | Fill #0

## 2020-10-06 ENCOUNTER — Other Ambulatory Visit (HOSPITAL_BASED_OUTPATIENT_CLINIC_OR_DEPARTMENT_OTHER): Payer: Self-pay | Admitting: Orthopedic Surgery

## 2020-10-06 MED FILL — CYCLOBENZAPRINE HCL 5 MG TA: 5 | 10 days supply | Qty: 60 | Fill #0

## 2020-10-09 ENCOUNTER — Other Ambulatory Visit: Payer: PRIVATE HEALTH INSURANCE

## 2020-10-09 ENCOUNTER — Other Ambulatory Visit: Payer: Self-pay | Admitting: Internal Medicine

## 2020-10-09 DIAGNOSIS — Z20822 Contact with and (suspected) exposure to covid-19: Secondary | ICD-10-CM

## 2020-10-10 ENCOUNTER — Other Ambulatory Visit (HOSPITAL_BASED_OUTPATIENT_CLINIC_OR_DEPARTMENT_OTHER): Payer: Self-pay | Admitting: Endocrinology

## 2020-10-10 LAB — NOVEL CORONAVIRUS, NAA: SARS-CoV-2, NAA: DETECTED — AB

## 2020-10-10 LAB — SARS-COV-2, NAA 2 DAY TAT

## 2020-10-10 LAB — SPECIMEN STATUS REPORT

## 2020-10-10 MED FILL — VASCEPA 1 GM CAPSULE: 1 | 30 days supply | Qty: 120 | Fill #4

## 2020-10-10 MED FILL — ALLOPURINOL 300 MG TABLET: 300 | 30 days supply | Qty: 30 | Fill #4

## 2020-10-10 MED FILL — FARXIGA 10 MG TABLET: 10 | 90 days supply | Qty: 90 | Fill #0

## 2020-10-10 MED FILL — HUMULIN R 500 UNITS/ML VIAL: 500 | 23 days supply | Qty: 20 | Fill #0

## 2020-10-10 MED FILL — METOPROLOL SUCCINATE ER 25: 25 | 90 days supply | Qty: 90 | Fill #0

## 2020-10-11 ENCOUNTER — Telehealth: Payer: Self-pay | Admitting: Family

## 2020-10-11 ENCOUNTER — Telehealth (HOSPITAL_COMMUNITY): Payer: Self-pay | Admitting: Pharmacist

## 2020-10-11 ENCOUNTER — Other Ambulatory Visit (HOSPITAL_COMMUNITY): Payer: Self-pay | Admitting: Family

## 2020-10-11 ENCOUNTER — Encounter: Payer: Self-pay | Admitting: Family

## 2020-10-11 MED ORDER — PROMETHAZINE-DM 6.25-15 MG/5ML PO SYRP: 5.0000 mL | ORAL_SOLUTION | Freq: Four times a day (QID) | ORAL | 0 refills | Status: DC | PRN

## 2020-10-11 MED ORDER — NIRMATRELVIR/RITONAVIR (PAXLOVID)TABLET: 3.0000 | ORAL_TABLET | Freq: Two times a day (BID) | ORAL | 0 refills | Status: AC

## 2020-10-11 MED FILL — PROMETHAZINE W/DM SYRUP: 6.25-15 | 6 days supply | Qty: 118 | Fill #0

## 2020-10-11 MED FILL — PAXLOVID 20 X 150 MG & 10 X: 20 X 150 MG | 5 days supply | Qty: 30 | Fill #0

## 2020-10-11 NOTE — Telephone Encounter (Signed)
Outpatient Oral COVID Treatment Note  I connected with Alec [name] on 10/11/2020/2:05 PM by telephone and verified that I am speaking with the correct person using two identifiers.  I discussed the limitations, risks, security, and privacy concerns of performing an evaluation and management service by telephone and the availability of in person appointments. I also discussed with the patient that there may be a patient responsible charge related to this service. The patient expressed understanding and agreed to proceed.  Patient location: [place] residence Provider location: [place] residence  Diagnosis: COVID-19 infection  Purpose of visit: Discussion of potential use of Molnupiravir or Paxlovid, a new treatment for mild to moderate COVID-19 viral infection in non-hospitalized patients.   Subjective: Patient is a 60 y.o. male who has been diagnosed with COVID 19 viral infection.  Their symptoms began on 10/08/20 with congestion.    Past Medical History:  Diagnosis Date  . Arthritis   . CAD (coronary artery disease)    a. 04/27/14 DES to dRCA; widely patent L coronary system b. cath 08/13/2015 no changes, patent RCA  . Chronic lower back pain   . Depression   . Gout   . History of echocardiogram    a. 04/27/14 with EF 65-70% and mild TR.  Marland Kitchen History of pancreatitis   . Metabolic syndrome   . Mixed hyperlipidemia   . Obesity (BMI 30-39.9) 02/16/2016  . OSA (obstructive sleep apnea) 11/26/2015   Severe with total AHI 104.8/hr and oxygen desaturations as low as 79%.  On CPAP at 13cm H2O  . Type 2 diabetes mellitus (HCC)    on Insulin pump    Allergies  Allergen Reactions  . Percocet [Oxycodone-Acetaminophen] Nausea Only    Makes patient feel not with it     Current Outpatient Medications:  .  Alirocumab (PRALUENT) 150 MG/ML SOAJ, Inject 150 mg into the skin every 14 (fourteen) days. Every other Sunday, Disp: , Rfl:  .  allopurinol (ZYLOPRIM) 300 MG tablet, Take 1 tablet (300 mg total) by  mouth daily., Disp: 30 tablet, Rfl: 11 .  atorvastatin (LIPITOR) 80 MG tablet, Take 80 mg by mouth at bedtime., Disp: , Rfl: 8 .  cetirizine (ZYRTEC) 10 MG tablet, Take 10 mg by mouth daily., Disp: , Rfl:  .  Empagliflozin (JARDIANCE) 25 MG TABS, Take 25 mg by mouth daily., Disp: , Rfl:  .  escitalopram (LEXAPRO) 20 MG tablet, TAKE 1 TABLET (20 MG) BY MOUTH DAILY. (Patient taking differently: Take 20 mg by mouth daily. ), Disp: 30 tablet, Rfl: 0 .  fenofibrate 160 MG tablet, Take 1 tablet (160 mg total) by mouth daily., Disp: 90 tablet, Rfl: 3 .  insulin regular human CONCENTRATED (HUMULIN R) 500 UNIT/ML SOLN injection, Inject into the skin continuous. Max 2 units per hour via insulin pump, bolus as needed blood suger, Disp: , Rfl:  .  methocarbamol (ROBAXIN) 500 MG tablet, Take 1 tablet (500 mg total) by mouth every 6 (six) hours as needed for muscle spasms., Disp: 30 tablet, Rfl: 1 .  metoprolol succinate (TOPROL-XL) 25 MG 24 hr tablet, Take 25 mg by mouth daily., Disp: , Rfl: 7 .  montelukast (SINGULAIR) 10 MG tablet, Take 10 mg by mouth at bedtime as needed (allergies). , Disp: , Rfl: 5 .  NITROSTAT 0.4 MG SL tablet, PLACE 1 TABLET (0.4 MG TOTAL) UNDER THE TONGUE EVERY 5 (FIVE) MINUTES X 3 DOSES AS NEEDED FOR CHEST PAIN. DIAL 911 AFTER 3 DOSES (Patient taking differently: Place 0.4 mg  under the tongue every 5 (five) minutes as needed for chest pain. ), Disp: 25 tablet, Rfl: 1 .  ONE TOUCH ULTRA TEST test strip, 1 each by Other route as needed for other (blood sugar). , Disp: , Rfl: 6 .  oxyCODONE-acetaminophen (PERCOCET/ROXICET) 5-325 MG tablet, Take 1-2 tablets by mouth every 4 (four) hours as needed for moderate pain or severe pain., Disp: 30 tablet, Rfl: 0 .  pregabalin (LYRICA) 50 MG capsule, Take 50 mg by mouth 2 (two) times daily., Disp: , Rfl:  .  ramipril (ALTACE) 10 MG capsule, Take 10 mg by mouth daily. , Disp: , Rfl: 6 .  sildenafil (VIAGRA) 100 MG tablet, Take 1 tablet (100 mg total)  by mouth daily as needed for erectile dysfunction., Disp: 10 tablet, Rfl: 0 .  valACYclovir (VALTREX) 1000 MG tablet, Take 1,000 mg by mouth daily as needed (cold sores). , Disp: , Rfl:  .  VASCEPA 1 G CAPS, Take 2 g by mouth 2 (two) times daily. , Disp: , Rfl:   Objective: Patient appears/sounds well.  They are in no apparent distress.  Breathing is non labored.  Mood and behavior are normal.  Laboratory Data:  Recent Results (from the past 2160 hour(s))  Glucose, capillary     Status: Abnormal   Collection Time: 08/01/20  1:17 PM  Result Value Ref Range   Glucose-Capillary 241 (H) 70 - 99 mg/dL    Comment: Glucose reference range applies only to samples taken after fasting for at least 8 hours.  APTT     Status: None   Collection Time: 08/01/20  2:02 PM  Result Value Ref Range   aPTT 28 24 - 36 seconds    Comment: Performed at [facility], [place] [address], [place], [place] [address]  CBC WITH DIFFERENTIAL     Status: None   Collection Time: 08/01/20  2:02 PM  Result Value Ref Range   WBC 10.1 4.0 - 10.5 K/uL   RBC 5.17 4.22 - 5.81 MIL/uL   Hemoglobin 15.6 13.0 - 17.0 g/dL   HCT 46.8 39.0 - 52.0 %   MCV 90.5 80.0 - 100.0 fL   MCH 30.2 26.0 - 34.0 pg   MCHC 33.3 30.0 - 36.0 g/dL   RDW 13.0 11.5 - 15.5 %   Platelets 199 150 - 400 K/uL   nRBC 0.0 0.0 - 0.2 %   Neutrophils Relative % 62 %   Neutro Abs 6.3 1.7 - 7.7 K/uL   Lymphocytes Relative 28 %   Lymphs Abs 2.8 0.7 - 4.0 K/uL   Monocytes Relative 8 %   Monocytes Absolute 0.8 0.1 - 1.0 K/uL   Eosinophils Relative 1 %   Eosinophils Absolute 0.1 0.0 - 0.5 K/uL   Basophils Relative 1 %   Basophils Absolute 0.1 0.0 - 0.1 K/uL   Immature Granulocytes 0 %   Abs Immature Granulocytes 0.04 0.00 - 0.07 K/uL    Comment: Performed at [facility], [address] [place], [place] [address]  Comprehensive metabolic panel     Status: Abnormal   Collection Time: 08/01/20  2:02 PM  Result Value Ref Range   Sodium 139 135 -  145 mmol/L    Comment: POST-ULTRACENTRIFUGATION   Potassium 4.1 3.5 - 5.1 mmol/L    Comment: POST-ULTRACENTRIFUGATION   Chloride 102 98 - 111 mmol/L    Comment: POST-ULTRACENTRIFUGATION   CO2 23 22 - 32 mmol/L    Comment: POST-ULTRACENTRIFUGATION   Glucose, Bld  201 (H) 70 - 99 mg/dL    Comment: Glucose reference range applies only to samples taken after fasting for at least 8 hours. POST-ULTRACENTRIFUGATION    BUN 14 6 - 20 mg/dL    Comment: POST-ULTRACENTRIFUGATION   Creatinine, Ser 0.80 0.61 - 1.24 mg/dL    Comment: POST-ULTRACENTRIFUGATION   Calcium 9.1 8.9 - 10.3 mg/dL    Comment: POST-ULTRACENTRIFUGATION   Total Protein 7.1 6.5 - 8.1 g/dL    Comment: POST-ULTRACENTRIFUGATION   Albumin 3.5 3.5 - 5.0 g/dL    Comment: POST-ULTRACENTRIFUGATION   AST 33 15 - 41 U/L    Comment: POST-ULTRACENTRIFUGATION   ALT 39 0 - 44 U/L    Comment: POST-ULTRACENTRIFUGATION   Alkaline Phosphatase 54 38 - 126 U/L   Total Bilirubin 0.6 0.3 - 1.2 mg/dL    Comment: POST-ULTRACENTRIFUGATION   GFR, Estimated >60 >60 mL/min    Comment: (NOTE) Calculated using the CKD-EPI Creatinine Equation (2021)    Anion gap 14 5 - 15    Comment: Performed at [facility], [place] [address] [place], [place] [address]  Protime-INR     Status: None   Collection Time: 08/01/20  2:02 PM  Result Value Ref Range   Prothrombin Time 13.1 11.4 - 15.2 seconds   INR 1.0 0.8 - 1.2    Comment: (NOTE) INR goal varies based on device and disease states. Performed at [facility], [place] [address] [place], [place] [address]   Urinalysis, Routine w reflex microscopic Urine, Clean Catch     Status: Abnormal   Collection Time: 08/01/20  2:02 PM  Result Value Ref Range   Color, Urine YELLOW YELLOW   APPearance CLEAR CLEAR   Specific Gravity, Urine 1.025 1.005 - 1.030   pH 5.0 5.0 - 8.0   Glucose, UA 150 (A) NEGATIVE mg/dL   Hgb urine dipstick SMALL (A) NEGATIVE   Bilirubin Urine NEGATIVE NEGATIVE   Ketones, ur  NEGATIVE NEGATIVE mg/dL   Protein, ur 100 (A) NEGATIVE mg/dL   Nitrite NEGATIVE NEGATIVE   Leukocytes,Ua NEGATIVE NEGATIVE   RBC / HPF 0-5 0 - 5 RBC/hpf   WBC, UA 0-5 0 - 5 WBC/hpf   Bacteria, UA RARE (A) NONE SEEN   Squamous Epithelial / LPF 0-5 0 - 5   Mucus PRESENT    Hyaline Casts, UA PRESENT    Sperm, UA PRESENT     Comment: Performed at [facility], [place] [address] [place], [place] [address]  Surgical pcr screen     Status: Abnormal   Collection Time: 08/01/20  2:02 PM   Specimen: Nasal Mucosa; Nasal Swab  Result Value Ref Range   MRSA, PCR NEGATIVE NEGATIVE   Staphylococcus aureus POSITIVE (A) NEGATIVE    Comment: (NOTE) The Xpert SA Assay (FDA approved for NASAL specimens in patients 64 years of age and older), is one component of a comprehensive surveillance program. It is not intended to diagnose infection nor to guide or monitor treatment. Performed at [facility], [place] [address] [place], [place] [address]   Type and screen [place]     Status: None   Collection Time: 08/01/20  2:13 PM  Result Value Ref Range   ABO/RH(D) A NEG    Antibody Screen NEG    Sample Expiration 08/15/2020,2359    Extend sample reason      NO TRANSFUSIONS OR PREGNANCY IN THE PAST 3 MONTHS Performed at [facility], [place]  [address], [place], [place] [address]   SARS CORONAVIRUS 2 (TAT 6-24 HRS) Nasopharyngeal Nasopharyngeal Swab     Status: None   Collection Time: 08/01/20  2:45 PM   Specimen: Nasopharyngeal Swab  Result Value Ref Range   SARS Coronavirus 2 NEGATIVE NEGATIVE    Comment: (NOTE) SARS-CoV-2 target nucleic acids are NOT DETECTED.  The SARS-CoV-2 RNA is generally detectable in upper and lower respiratory specimens during the acute phase of infection. Negative results do not preclude SARS-CoV-2 infection, do not rule out co-infections with other pathogens, and should not be used as the sole basis for treatment or other patient  management decisions. Negative results must be combined with clinical observations, patient history, and epidemiological information. The expected result is Negative.  Fact Sheet for Patients: SugarRoll.be  Fact Sheet for Healthcare Providers: https://www.woods-mathews.com/  This test is not yet approved or cleared by the [place] FDA and  has been authorized for detection and/or diagnosis of SARS-CoV-2 by FDA under an Emergency Use Authorization (EUA). This EUA will remain  in effect (meaning this test can be used) for the duration of the COVID-19 declaration under Se ction 564(b)(1) of the Act, 21 U.S.C. section 360bbb-3(b)(1), unless the authorization is terminated or revoked sooner.  Performed at [facility], [place] [address], [place], [place] [address]   Glucose, capillary     Status: None   Collection Time: 08/03/20  6:46 AM  Result Value Ref Range   Glucose-Capillary 92 70 - 99 mg/dL    Comment: Glucose reference range applies only to samples taken after fasting for at least 8 hours.  Glucose, capillary     Status: None   Collection Time: 08/03/20  8:24 AM  Result Value Ref Range   Glucose-Capillary 98 70 - 99 mg/dL    Comment: Glucose reference range applies only to samples taken after fasting for at least 8 hours.  Glucose, capillary     Status: None   Collection Time: 08/03/20  9:30 AM  Result Value Ref Range   Glucose-Capillary 99 70 - 99 mg/dL    Comment: Glucose reference range applies only to samples taken after fasting for at least 8 hours.  Glucose, capillary     Status: Abnormal   Collection Time: 08/03/20 10:47 AM  Result Value Ref Range   Glucose-Capillary 155 (H) 70 - 99 mg/dL    Comment: Glucose reference range applies only to samples taken after fasting for at least 8 hours.  Hemoglobin A1c     Status: Abnormal   Collection Time: 08/03/20  2:51 PM  Result Value Ref Range   Hgb A1c MFr Bld 7.8 (H) 4.8  - 5.6 %    Comment: (NOTE) Pre diabetes:          5.7%-6.4%  Diabetes:              >6.4%  Glycemic control for   <7.0% adults with diabetes    Mean Plasma Glucose 177.16 mg/dL    Comment: Performed at [facility], [place] [address] [place], [place] [address]  Glucose, capillary     Status: Abnormal   Collection Time: 08/03/20  5:08 PM  Result Value Ref Range   Glucose-Capillary 278 (H) 70 - 99 mg/dL    Comment: Glucose reference range applies only to samples taken after fasting for at least 8 hours.   Comment 1 Notify RN    Comment 2 Document in Chart   Glucose, capillary     Status:  Abnormal   Collection Time: 08/03/20  9:03 PM  Result Value Ref Range   Glucose-Capillary 274 (H) 70 - 99 mg/dL    Comment: Glucose reference range applies only to samples taken after fasting for at least 8 hours.   Comment 1 Notify RN    Comment 2 Document in Chart   Glucose, capillary     Status: Abnormal   Collection Time: 08/04/20  2:10 AM  Result Value Ref Range   Glucose-Capillary 240 (H) 70 - 99 mg/dL    Comment: Glucose reference range applies only to samples taken after fasting for at least 8 hours.  Glucose, capillary     Status: Abnormal   Collection Time: 08/04/20  6:19 AM  Result Value Ref Range   Glucose-Capillary 135 (H) 70 - 99 mg/dL    Comment: Glucose reference range applies only to samples taken after fasting for at least 8 hours.   Comment 1 Notify RN    Comment 2 Document in Chart   Novel Coronavirus, NAA ([facility])     Status: Abnormal   Collection Time: 10/09/20 12:00 AM   Specimen: Nasopharyngeal(NP) swabs in vial transport medium   Nasopharynge  Result Value Ref Range   SARS-CoV-2, NAA Detected (A) Not Detected    Comment: Patients who have a positive COVID-19 test result may now have treatment options. Treatment options are available for patients with mild to moderate symptoms and for hospitalized patients. Visit our website at [email or web address]  for resources and information. This nucleic acid amplification test was developed and its performance characteristics determined by [employer]. Nucleic acid amplification tests include RT-PCR and TMA. This test has not been FDA cleared or approved. This test has been authorized by FDA under an Emergency Use Authorization (EUA). This test is only authorized for the duration of time the declaration that circumstances exist justifying the authorization of the emergency use of in vitro diagnostic tests for detection of SARS-CoV-2 virus and/or diagnosis of COVID-19 infection under section 564(b)(1) of the Act, 21 U.S.C. [id number]) (1), unless the authorization is terminated or revoked sooner. When diagnostic testing is negativ e, the possibility of a false negative result should be considered in the context of a patient's recent exposures and the presence of clinical signs and symptoms consistent with COVID-19. An individual without symptoms of COVID-19 and who is not shedding SARS-CoV-2 virus would expect to have a negative (not detected) result in this assay.   SARS-COV-2, NAA 2 DAY TAT     Status: None   Collection Time: 10/09/20 12:00 AM   Nasopharynge  Result Value Ref Range   SARS-CoV-2, NAA 2 DAY TAT Performed   Specimen status report     Status: None   Collection Time: 10/09/20 12:00 AM  Result Value Ref Range   specimen status report Comment     Comment: Please note Please note The date and/or time of collection was not indicated on the requisition as required by state and federal law.  The date of receipt of the specimen was used as the collection date if not supplied.      Assessment: 60 y.o. male with mild/moderate COVID 19 viral infection diagnosed on 10/09/20 at high risk for progression to severe COVID 19.  Plan:  This patient is a 60 y.o. male that meets the following criteria for Emergency Use Authorization of: Paxlovid 1. Age >12 yr AND > 40 kg 2.  SARS-COV-2 positive test 3. Symptom onset < 5 days 4. Mild-to-moderate COVID disease with high  risk for severe progression to hospitalization or death  I have also sent Rx for cough syrup for symptom management.  I have spoken and communicated the following to the patient or parent/caregiver regarding: 1. Paxlovid is an unapproved drug that is authorized for use under an Emergency Use Authorization.  2. There are no adequate, approved, available products for the treatment of COVID-19 in adults who have mild-to-moderate COVID-19 and are at high risk for progressing to severe COVID-19, including hospitalization or death. 3. Other therapeutics are currently authorized. For additional information on all products authorized for treatment or prevention of COVID-19, please see TanEmporium.pl.  4. There are benefits and risks of taking this treatment as outlined in the "Fact Sheet for Patients and Caregivers."  5. "Fact Sheet for Patients and Caregivers" was reviewed with patient. A hard copy will be provided to patient from pharmacy prior to the patient receiving treatment. 6. Patients should continue to self-isolate and use infection control measures (e.g., wear mask, isolate, social distance, avoid sharing personal items, clean and disinfect "high touch" surfaces, and frequent handwashing) according to CDC guidelines.  7. The patient or parent/caregiver has the option to accept or refuse treatment. 8. Patient medication history was reviewed for potential drug interactions:Interaction with home meds: Sildenafil - instructed to not take Sildenafil while taking Paxlovid 9. Patient's creatinine clearance was calculated to be 138, and they were therefore prescribed Normal dose (CrCl>60) - nirmatrelvir 150mg  tab (2 tablet) by mouth twice daily AND ritonavir 100mg  tab (1 tablet) by mouth twice  daily   After reviewing above information with the patient, the patient agrees to receive Paxlovid.  Follow up instructions:    . Take prescription BID x 5 days as directed . Reach out to pharmacist for counseling on medication if desired . For concerns regarding further COVID symptoms please follow up with your PCP or urgent care . For urgent or life-threatening issues, seek care at your local emergency department  The patient was provided an opportunity to ask questions, and all were answered. The patient agreed with the plan and demonstrated an understanding of the instructions.   Script sent to [facility] and opted to pick up RX.  The patient was advised to call their PCP or seek an in-person evaluation if the symptoms worsen or if the condition fails to improve as anticipated.   I provided 15 minutes of non face-to-face telephone visit time during this encounter, and > 50% was spent counseling as documented under my assessment & plan.  Loel Dubonnet, NP 10/11/2020 /2:05 PM

## 2020-10-11 NOTE — Telephone Encounter (Signed)
Patient was prescribed oral covid treatment PAXLOVID and treatment note was reviewed. Medication has been received by [place] and reviewed for appropriateness.  Drug Interactions or Dosage Adjustments Noted: SILDENAFIL & ATORVASTATIN PT ADVISED TO HOLD BOTH  Delivery Method: PICKUP  Patient contacted for counseling on TELEPHONE   and verbalized understanding.   Delivery or Pick-Up Date:10/11/20   Marye Round 10/11/2020, 3:35 PM [place] Pharmacist Phone# [phone]

## 2020-10-13 ENCOUNTER — Other Ambulatory Visit (HOSPITAL_BASED_OUTPATIENT_CLINIC_OR_DEPARTMENT_OTHER): Payer: Self-pay | Admitting: Endocrinology

## 2020-10-13 MED FILL — HYDROCODON-APAP 10-325: 10-325 | 30 days supply | Qty: 120 | Fill #0

## 2020-10-23 ENCOUNTER — Other Ambulatory Visit (HOSPITAL_BASED_OUTPATIENT_CLINIC_OR_DEPARTMENT_OTHER): Payer: Self-pay | Admitting: Internal Medicine

## 2020-10-23 ENCOUNTER — Ambulatory Visit: Payer: PRIVATE HEALTH INSURANCE | Attending: Internal Medicine

## 2020-10-23 DIAGNOSIS — Z23 Encounter for immunization: Secondary | ICD-10-CM

## 2020-10-23 MED FILL — PFIZER-BIONT COVID-19 VAC-T: 30 | 21 days supply | Qty: 2 | Fill #0

## 2020-10-23 NOTE — Progress Notes (Signed)
   Covid-19 Vaccination Clinic  Name:  Alec [name]    MRN: [id number] DOB: April 19, 1961  10/23/2020  Alec [name] was observed post Covid-19 immunization for 15 minutes without incident. He was provided with Vaccine Information Sheet and instruction to access the V-Safe system.   Alec [name] was instructed to call 911 with any severe reactions post vaccine: Marland Kitchen Difficulty breathing  . Swelling of face and throat  . A fast heartbeat  . A bad rash all over body  . Dizziness and weakness   Immunizations Administered    Name Date Dose VIS Date Route   PFIZER Comrnaty(Gray TOP) Covid-19 Vaccine 10/23/2020  1:38 PM 0.3 mL 08/24/2020 Intramuscular   Manufacturer: [place]   Lot: [id number]   NDC: [phone]

## 2020-11-07 MED FILL — HUMULIN R 500 UNITS/ML VIAL: 500 | 23 days supply | Qty: 20 | Fill #1

## 2020-11-07 MED FILL — ALLOPURINOL 300 MG TABLET: 300 | 30 days supply | Qty: 30 | Fill #5

## 2020-11-07 MED FILL — VASCEPA 1 GM CAPSULE: 1 | 30 days supply | Qty: 120 | Fill #5

## 2020-11-09 MED FILL — ESCITALOPRAM 20 MG TABLET: 20 | 90 days supply | Qty: 90 | Fill #1

## 2020-11-09 MED FILL — PREGABALIN 50 MG CAPS: 50 | 30 days supply | Qty: 60 | Fill #4

## 2020-11-09 MED FILL — FENOFIBRATE 160 MG TABLET: 160 | 90 days supply | Qty: 90 | Fill #3

## 2020-11-10 ENCOUNTER — Other Ambulatory Visit (HOSPITAL_BASED_OUTPATIENT_CLINIC_OR_DEPARTMENT_OTHER): Payer: Self-pay | Admitting: Endocrinology

## 2020-11-13 MED FILL — HYDROCODON-APAP 10-325: 10-325 | 30 days supply | Qty: 120 | Fill #0

## 2020-11-28 ENCOUNTER — Other Ambulatory Visit (HOSPITAL_BASED_OUTPATIENT_CLINIC_OR_DEPARTMENT_OTHER): Payer: Self-pay | Admitting: Endocrinology

## 2020-12-04 MED FILL — HUMULIN R 500 UNITS/ML VIAL: 500 | 23 days supply | Qty: 20 | Fill #2

## 2020-12-13 ENCOUNTER — Other Ambulatory Visit (HOSPITAL_BASED_OUTPATIENT_CLINIC_OR_DEPARTMENT_OTHER): Payer: Self-pay | Admitting: Endocrinology

## 2020-12-13 MED FILL — PREGABALIN 50 MG CAPS: 50 | 30 days supply | Qty: 60 | Fill #0

## 2020-12-13 MED FILL — ALLOPURINOL 300 MG TABLET: 300 | 30 days supply | Qty: 30 | Fill #6

## 2020-12-13 MED FILL — VASCEPA 1 GM CAPSULE: 1 | 30 days supply | Qty: 120 | Fill #0

## 2020-12-14 MED FILL — HYDROCODON-APAP 10-325: 10-325 | 30 days supply | Qty: 120 | Fill #0

## 2020-12-25 ENCOUNTER — Telehealth: Payer: Self-pay | Admitting: Cardiology

## 2020-12-25 NOTE — Telephone Encounter (Signed)
Patient called today and states his CPAP is not connecting to the Internet, so it not sending information to our office. He is going to call [place] today but he wanted to make Dr. Radford Pax aware of the lack of data. Please follow up if needed

## 2020-12-25 NOTE — Telephone Encounter (Signed)
Reached out to [place] and was told they gave the patient the number to reach [facility] and I should give the patients this [phone] number to [facility] for help.

## 2021-01-01 ENCOUNTER — Other Ambulatory Visit (HOSPITAL_BASED_OUTPATIENT_CLINIC_OR_DEPARTMENT_OTHER): Payer: Self-pay

## 2021-01-03 ENCOUNTER — Other Ambulatory Visit (HOSPITAL_BASED_OUTPATIENT_CLINIC_OR_DEPARTMENT_OTHER): Payer: Self-pay

## 2021-01-03 MED FILL — Insulin Regular (Human) Inj 500 Unit/ML: SUBCUTANEOUS | 23 days supply | Qty: 20 | Fill #0 | Status: AC

## 2021-01-05 ENCOUNTER — Other Ambulatory Visit (HOSPITAL_BASED_OUTPATIENT_CLINIC_OR_DEPARTMENT_OTHER): Payer: Self-pay

## 2021-01-09 ENCOUNTER — Other Ambulatory Visit (HOSPITAL_BASED_OUTPATIENT_CLINIC_OR_DEPARTMENT_OTHER): Payer: Self-pay

## 2021-01-09 MED ORDER — MONTELUKAST SODIUM 10 MG PO TABS
ORAL_TABLET | ORAL | 3 refills | Status: AC
  Filled 2021-01-09: qty 30, 30d supply, fill #0
  Filled 2021-02-07: qty 30, 30d supply, fill #1
  Filled 2021-12-06: qty 30, 30d supply, fill #2

## 2021-01-11 ENCOUNTER — Other Ambulatory Visit (HOSPITAL_BASED_OUTPATIENT_CLINIC_OR_DEPARTMENT_OTHER): Payer: Self-pay

## 2021-01-11 MED ORDER — HYDROCODONE-ACETAMINOPHEN 10-325 MG PO TABS
ORAL_TABLET | Freq: Four times a day (QID) | ORAL | 0 refills | Status: DC
  Filled 2021-01-11: qty 120, 30d supply, fill #0

## 2021-01-12 ENCOUNTER — Other Ambulatory Visit (HOSPITAL_BASED_OUTPATIENT_CLINIC_OR_DEPARTMENT_OTHER): Payer: Self-pay

## 2021-01-12 MED FILL — Icosapent Ethyl Cap 1 GM: ORAL | 30 days supply | Qty: 120 | Fill #0 | Status: AC

## 2021-01-18 ENCOUNTER — Other Ambulatory Visit (HOSPITAL_BASED_OUTPATIENT_CLINIC_OR_DEPARTMENT_OTHER): Payer: Self-pay

## 2021-01-18 ENCOUNTER — Other Ambulatory Visit (HOSPITAL_BASED_OUTPATIENT_CLINIC_OR_DEPARTMENT_OTHER): Payer: Self-pay | Admitting: Endocrinology

## 2021-01-18 MED FILL — Dapagliflozin Propanediol Tab 10 MG (Base Equivalent): ORAL | 90 days supply | Qty: 90 | Fill #0 | Status: AC

## 2021-01-19 ENCOUNTER — Other Ambulatory Visit (HOSPITAL_BASED_OUTPATIENT_CLINIC_OR_DEPARTMENT_OTHER): Payer: Self-pay

## 2021-01-19 MED ORDER — ALLOPURINOL 300 MG PO TABS
ORAL_TABLET | Freq: Every day | ORAL | 6 refills | Status: DC
  Filled 2021-01-19: qty 30, 30d supply, fill #0
  Filled 2021-03-13: qty 30, 30d supply, fill #1
  Filled 2021-04-10: qty 30, 30d supply, fill #2
  Filled 2021-06-12: qty 30, 30d supply, fill #3
  Filled 2021-07-03: qty 30, 30d supply, fill #4
  Filled 2021-08-30: qty 30, 30d supply, fill #5
  Filled 2021-10-10: qty 30, 30d supply, fill #6

## 2021-01-24 ENCOUNTER — Telehealth: Payer: PRIVATE HEALTH INSURANCE | Admitting: Cardiology

## 2021-01-24 ENCOUNTER — Other Ambulatory Visit: Payer: Self-pay

## 2021-01-30 ENCOUNTER — Ambulatory Visit: Payer: PRIVATE HEALTH INSURANCE | Admitting: Cardiology

## 2021-01-30 ENCOUNTER — Encounter: Payer: Self-pay | Admitting: Cardiology

## 2021-01-30 ENCOUNTER — Other Ambulatory Visit: Payer: Self-pay

## 2021-01-30 VITALS — BP 110/62 | HR 68 | Ht 70.0 in | Wt 285.4 lb

## 2021-01-30 DIAGNOSIS — I10 Essential (primary) hypertension: Secondary | ICD-10-CM | POA: Diagnosis not present

## 2021-01-30 DIAGNOSIS — G4733 Obstructive sleep apnea (adult) (pediatric): Secondary | ICD-10-CM | POA: Diagnosis not present

## 2021-01-30 NOTE — Patient Instructions (Signed)
Medication Instructions:  Your physician recommends that you continue on your current medications as directed. Please refer to the Current Medication list given to you today.  *If you need a refill on your cardiac medications before your next appointment, please call your pharmacy*  Follow-Up: At [facility] you and your health needs are our priority.  As part of our continuing mission to provide you with exceptional heart care, we have created designated Provider Care Teams.  These Care Teams include your primary Cardiologist (physician) and Advanced Practice Providers (APPs -  Physician Assistants and Nurse Practitioners) who all work together to provide you with the care you need, when you need it.  Follow up with Dr. Radford Pax after you receive your new device.

## 2021-01-30 NOTE — Progress Notes (Signed)
Date:  01/30/2021   ID:  Alec [name], DOB 26-Feb-1961, MRN [id number]   PCP:  Reynold Bowen, MD  Cardiologist:  [name], MD  Sleep medicine:  Fransico Him, MD Electrophysiologist:  None   Chief Complaint:  OSA, obesity and HTN  History of Present Illness:    Alec [name] is a 60 y.o. male  with a hx of severe OSA with an AHI of 104/hr and oxygen desaturations as low as 79%. The patient had severe snoring. Heis on CPAP therapy at13cm H2O.  He is doing well with his CPAP device and thinks that he has gotten used to it.  He tolerates the mask and feels the pressure is adequate.  Since going on CPAP he feels rested in the am and has no significant daytime sleepiness.  He denies any significant mouth or nasal dryness or nasal congestion.  He does not think that he snores.    Prior CV studies:   The following studies were reviewed today:  PAP compliance download  Past Medical History:  Diagnosis Date  . Arthritis   . CAD (coronary artery disease)    a. 04/27/14 DES to dRCA; widely patent L coronary system b. cath 08/13/2015 no changes, patent RCA  . Chronic lower back pain   . Depression   . Gout   . History of echocardiogram    a. 04/27/14 with EF 65-70% and mild TR.  Marland Kitchen History of pancreatitis   . Metabolic syndrome   . Mixed hyperlipidemia   . Obesity (BMI 30-39.9) 02/16/2016  . OSA (obstructive sleep apnea) 11/26/2015   Severe with total AHI 104.8/hr and oxygen desaturations as low as 79%.  On CPAP at 13cm H2O  . Type 2 diabetes mellitus (HCC)    on Insulin pump   Past Surgical History:  Procedure Laterality Date  . ANTERIOR LAT LUMBAR FUSION Right 08/03/2020   Procedure: RIGHT LUMBAR THREE-LUMBAR FOUR LATERAL INTERBODY FUSION WITH INSTRUMENTATION AND ALLOGRAFT;  Surgeon: Phylliss Bob, MD;  Location: [place];  Service: Orthopedics;  Laterality: Right;  anterolateral  . CARDIAC CATHETERIZATION N/A 08/14/2015   Procedure: Left Heart Cath and Coronary Angiography;   Surgeon: Belva Crome, MD;  Location: [place] [facility];  Service: Cardiovascular;  Laterality: N/A;  . CARPAL TUNNEL RELEASE Bilateral 2006-2008   left 12/2004; right 10/2006  . CHONDROPLASTY  04/20/2015   Procedure: CHONDROPLASTY;  Surgeon: Melrose Nakayama, MD;  Location: [place];  Service: Orthopedics;;  . FUNCTIONAL ENDOSCOPIC SINUS SURGERY  10/2001; 08/2005;   . KNEE ARTHROSCOPY Bilateral 2000's   right 08/2003;   . KNEE ARTHROSCOPY WITH MEDIAL MENISECTOMY Left 04/20/2015   Procedure: LEFT ARTHROSCOPY KNEE WITH MEDIAL MENISECTOMY;  Surgeon: Melrose Nakayama, MD;  Location: [place];  Service: Orthopedics;  Laterality: Left;  Partial medial menisectomy, chondroplasty.  Marland Kitchen LEFT HEART CATHETERIZATION WITH CORONARY ANGIOGRAM N/A 04/27/2014   Procedure: LEFT HEART CATHETERIZATION WITH CORONARY ANGIOGRAM;  Surgeon: [name], MD;  Location: [facility] CATH LAB;  Service: Cardiovascular;  Laterality: N/A;  . LUMBAR LAMINECTOMY Left 05/2006  . PERCUTANEOUS STENT INTERVENTION  04/27/2014   Procedure: PERCUTANEOUS STENT INTERVENTION;  Surgeon: [name], MD;  Location: [facility] CATH LAB;  Service: Cardiovascular;;  DES x2 (distal +prox RCA)  . POSTERIOR LUMBAR FUSION  07/2009  . SHOULDER ARTHROSCOPY Right ~ 2010     Current Meds  Medication Sig  . Alirocumab (PRALUENT) 150 MG/ML SOAJ Inject 150 mg into the skin  every 14 (fourteen) days. Every other Sunday  . allopurinol (ZYLOPRIM) 300 MG tablet TAKE 1 TABLET BY MOUTH ONCE DAILY  . atorvastatin (LIPITOR) 80 MG tablet TAKE 1 TABLET BY MOUTH DAILY  . cetirizine (ZYRTEC) 10 MG tablet Take 10 mg by mouth daily.  Marland Kitchen COVID-19 mRNA Vac-TriS, Pfizer, SUSP injection INJECT AS DIRECTED  . cyclobenzaprine (FLEXERIL) 5 MG tablet TAKE 1-2 TABLET BY MOUTH THREE TIMES A DAY AS NEEDED  . dapagliflozin propanediol (FARXIGA) 10 MG TABS tablet TAKE 1 TABLET BY MOUTH ONCE DAILY  . escitalopram (LEXAPRO) 20 MG tablet TAKE 1 TABLET (20 MG) BY  MOUTH DAILY.  Marland Kitchen Evolocumab (REPATHA SURECLICK) 829 MG/ML SOAJ See admin instructions.  Marland Kitchen glucose blood test strip USE AS DIRECTED TO SELF-MONITOR BLOOD GLUCOSE UP TO 10 TIMES DAILY 30 (Patient taking differently: USE AS DIRECTED TO SELF-MONITOR BLOOD GLUCOSE UP TO 10 TIMES DAILY 30)  . HYDROcodone-acetaminophen (NORCO) 10-325 MG tablet TAKE 1 TABLET BY MOUTH FOUR TIMES DAILY AS NEEDED FOR PAIN (EVERY 6 HOURS)  . icosapent Ethyl (VASCEPA) 1 g capsule TAKE 2 CAPSULES BY MOUTH TWICE DAILY  . insulin regular human CONCENTRATED (HUMULIN R) 500 UNIT/ML injection Inject into the skin continuous. Max 2 units per hour via insulin pump, bolus as needed blood suger  . insulin regular human CONCENTRATED (HUMULIN R) 500 UNIT/ML injection USE IN INSULIN PUMP AS DIRECTED. USE 30 UNIT MARKINGS (150 UNITS) 3 TIMES A DAY  . methocarbamol (ROBAXIN) 500 MG tablet TAKE 1 TABLET (500 MG TOTAL) BY MOUTH EVERY 6 (SIX) HOURS AS NEEDED FOR MUSCLE SPASMS.  . metoprolol succinate (TOPROL-XL) 25 MG 24 hr tablet TAKE 1 TABLET BY MOUTH DAILY  . montelukast (SINGULAIR) 10 MG tablet Take 1 tablet by mouth daily as needed  . NITROSTAT 0.4 MG SL tablet PLACE 1 TABLET (0.4 MG TOTAL) UNDER THE TONGUE EVERY 5 (FIVE) MINUTES X 3 DOSES AS NEEDED FOR CHEST PAIN. DIAL 911 AFTER 3 DOSES (Patient taking differently: Place 0.4 mg under the tongue every 5 (five) minutes as needed for chest pain.)  . ONE TOUCH ULTRA TEST test strip 1 each by Other route as needed for other (blood sugar).   . pregabalin (LYRICA) 50 MG capsule TAKE 1 CAPSULE BY MOUTH TWICE DAILY  . ramipril (ALTACE) 10 MG capsule TAKE 1 CAPSULE BY MOUTH ONCE DAILY  . sildenafil (VIAGRA) 100 MG tablet TAKE 1 TABLET BY MOUTH ONCE DAILY AS NEEDED  . valACYclovir (VALTREX) 1000 MG tablet Take 1,000 mg by mouth daily as needed (cold sores).   . VASCEPA 1 G CAPS Take 2 g by mouth 2 (two) times daily.      Allergies:   Hydrocodone, Oxycodone-acetaminophen, and Percocet  [oxycodone-acetaminophen]   Social History   Tobacco Use  . Smoking status: Passive Smoke Exposure - Never Smoker  . Smokeless tobacco: Never Used  . Tobacco comment: tried for less than a year when 16  Vaping Use  . Vaping Use: Never used  Substance Use Topics  . Alcohol use: Yes    Comment: occ  . Drug use: No     Family Hx: The patient's family history includes Asthma in his son; Coronary artery disease in his mother; Diabetes in his father and mother; Emphysema in his mother; Heart attack in his mother; Lung cancer in his father; Prostate cancer in his father. There is no history of Colon cancer.  ROS:   Please see the history of present illness.     All other systems reviewed  and are negative.   Labs/Other Tests and Data Reviewed:    Recent Labs: 08/01/2020: ALT 39; BUN 14; Creatinine, Ser 0.80; Hemoglobin 15.6; Platelets 199; Potassium 4.1; Sodium 139   Recent Lipid Panel Lab Results  Component Value Date/Time   CHOL 349 (H) 10/13/2015 08:58 AM   TRIG 2,598 (H) 10/13/2015 08:58 AM   HDL 27 (L) 10/13/2015 08:58 AM   CHOLHDL 12.9 (H) 10/13/2015 08:58 AM   LDLCALC NOT CALC 10/13/2015 08:58 AM   LDLDIRECT 65 10/13/2015 08:58 AM    Wt Readings from Last 3 Encounters:  01/30/21 285 lb 6.4 oz (129.5 kg)  08/01/20 290 lb 6.4 oz (131.7 kg)  12/23/19 284 lb (128.8 kg)     Objective:    Vital Signs:  BP 110/62   Pulse 68   Ht 5\' 10"  (1.778 m)   Wt 285 lb 6.4 oz (129.5 kg)   SpO2 97%   BMI 40.95 kg/m   GEN: Well nourished, well developed in no acute distress HEENT: Normal NECK: No JVD; No carotid bruits LYMPHATICS: No lymphadenopathy CARDIAC:RRR, no murmurs, rubs, gallops RESPIRATORY:  Clear to auscultation without rales, wheezing or rhonchi  ABDOMEN: Soft, non-tender, non-distended MUSCULOSKELETAL:  No edema; No deformity  SKIN: Warm and dry NEUROLOGIC:  Alert and oriented x 3 PSYCHIATRIC:  Normal affect     ASSESSMENT & PLAN:    1.  OSA - The  patient is tolerating PAP therapy well without any problems. The PAP download was reviewed today and showed an AHI of 0.7/hr on 13 cm H2O with 93% compliance in using more than 4 hours nightly.  The patient has been using and benefiting from PAP use and will continue to benefit from therapy.  -his device is  59 years old and no longer transmits data so I will order him a new ResMed CPAP at 13cm H2o with heated humidity and mask of choice  2.  HTN -his BP is adequately controlled on exam today -he will continue prescription drug management with Toprol XL 25mg  daily and Ramipril 10mg  daily -I have personally reviewed and interpreted outside labs by PCP which show a stable SCr of 0.8, K+ 4.6 from Nov 2021  3.  Morbid Obesity -I have encouraged him to cut back on carbs and portions.  -he had back surgery this past year and has a lot of pain but is walking   Medication Adjustments/Labs and Tests Ordered: Current medicines are reviewed at length with the patient today.  Concerns regarding medicines are outlined above.  Tests Ordered: No orders of the defined types were placed in this encounter.  Medication Changes: No orders of the defined types were placed in this encounter.   Disposition:  Follow up in 6 months  Signed, Fransico Him, MD  01/30/2021 10:35 AM    [place]

## 2021-01-31 ENCOUNTER — Telehealth: Payer: Self-pay | Admitting: *Deleted

## 2021-01-31 DIAGNOSIS — G4733 Obstructive sleep apnea (adult) (pediatric): Secondary | ICD-10-CM

## 2021-01-31 NOTE — Telephone Encounter (Signed)
-----   Message from Sueanne Margarita, MD sent at 01/30/2021 10:34 AM EDT -----  order a new ResMed CPAP at 13cm H2o with heated humidity and mask of choice

## 2021-01-31 NOTE — Telephone Encounter (Signed)
Order placed to [employer].to order a new ResMed CPAP at 13cm H2o with heated humidity and mask of choice

## 2021-02-02 ENCOUNTER — Other Ambulatory Visit (HOSPITAL_BASED_OUTPATIENT_CLINIC_OR_DEPARTMENT_OTHER): Payer: Self-pay

## 2021-02-02 MED FILL — Insulin Regular (Human) Inj 500 Unit/ML: SUBCUTANEOUS | 23 days supply | Qty: 20 | Fill #1 | Status: AC

## 2021-02-07 ENCOUNTER — Other Ambulatory Visit (HOSPITAL_BASED_OUTPATIENT_CLINIC_OR_DEPARTMENT_OTHER): Payer: Self-pay

## 2021-02-07 MED ORDER — HYDROCODONE-ACETAMINOPHEN 10-325 MG PO TABS
ORAL_TABLET | ORAL | 0 refills | Status: DC
  Filled 2021-02-07: qty 120, 30d supply, fill #0

## 2021-02-07 MED FILL — Icosapent Ethyl Cap 1 GM: ORAL | 30 days supply | Qty: 120 | Fill #1 | Status: AC

## 2021-02-08 ENCOUNTER — Other Ambulatory Visit (HOSPITAL_BASED_OUTPATIENT_CLINIC_OR_DEPARTMENT_OTHER): Payer: Self-pay

## 2021-02-08 MED FILL — Metoprolol Succinate Tab ER 24HR 25 MG (Tartrate Equiv): ORAL | 90 days supply | Qty: 90 | Fill #0 | Status: AC

## 2021-02-15 ENCOUNTER — Other Ambulatory Visit (HOSPITAL_BASED_OUTPATIENT_CLINIC_OR_DEPARTMENT_OTHER): Payer: Self-pay

## 2021-02-19 ENCOUNTER — Other Ambulatory Visit: Payer: Self-pay | Admitting: Interventional Cardiology

## 2021-02-19 ENCOUNTER — Other Ambulatory Visit (HOSPITAL_BASED_OUTPATIENT_CLINIC_OR_DEPARTMENT_OTHER): Payer: Self-pay

## 2021-02-19 MED FILL — Pregabalin Cap 50 MG: ORAL | 30 days supply | Qty: 60 | Fill #0 | Status: AC

## 2021-02-20 ENCOUNTER — Other Ambulatory Visit (HOSPITAL_BASED_OUTPATIENT_CLINIC_OR_DEPARTMENT_OTHER): Payer: Self-pay

## 2021-02-20 NOTE — Telephone Encounter (Signed)
Please advise on refill request. Gave me a duplicate therapy warning msg that pt is also on repatha, praluent, atorvastatin and vascepa. Also, fenofibrate is not listed on last office note with our office which was with Dr Radford Pax but possibly due to the rx being expired. Thanks, MI

## 2021-02-21 ENCOUNTER — Other Ambulatory Visit (HOSPITAL_BASED_OUTPATIENT_CLINIC_OR_DEPARTMENT_OTHER): Payer: Self-pay

## 2021-02-21 MED ORDER — ESCITALOPRAM OXALATE 20 MG PO TABS
1.0000 | ORAL_TABLET | Freq: Every day | ORAL | 1 refills | Status: DC
  Filled 2021-02-21: qty 90, 90d supply, fill #0
  Filled 2021-06-12: qty 90, 90d supply, fill #1

## 2021-02-21 MED ORDER — FENOFIBRATE 160 MG PO TABS
160.0000 mg | ORAL_TABLET | Freq: Every day | ORAL | 0 refills | Status: DC
  Filled 2021-02-21: qty 90, 90d supply, fill #0

## 2021-02-21 NOTE — Telephone Encounter (Signed)
Ok to fill until time of his appt in August.  Needs to be seen before receiving refills.

## 2021-02-22 ENCOUNTER — Other Ambulatory Visit (HOSPITAL_BASED_OUTPATIENT_CLINIC_OR_DEPARTMENT_OTHER): Payer: Self-pay

## 2021-02-23 ENCOUNTER — Other Ambulatory Visit (HOSPITAL_BASED_OUTPATIENT_CLINIC_OR_DEPARTMENT_OTHER): Payer: Self-pay

## 2021-02-26 ENCOUNTER — Other Ambulatory Visit (HOSPITAL_BASED_OUTPATIENT_CLINIC_OR_DEPARTMENT_OTHER): Payer: Self-pay

## 2021-02-28 ENCOUNTER — Other Ambulatory Visit (HOSPITAL_BASED_OUTPATIENT_CLINIC_OR_DEPARTMENT_OTHER): Payer: Self-pay

## 2021-02-28 MED ORDER — VALACYCLOVIR HCL 1 G PO TABS
ORAL_TABLET | ORAL | 0 refills | Status: DC
  Filled 2021-02-28: qty 30, 8d supply, fill #0

## 2021-03-02 ENCOUNTER — Other Ambulatory Visit (HOSPITAL_BASED_OUTPATIENT_CLINIC_OR_DEPARTMENT_OTHER): Payer: Self-pay

## 2021-03-02 MED FILL — Insulin Regular (Human) Inj 500 Unit/ML: SUBCUTANEOUS | 23 days supply | Qty: 20 | Fill #2 | Status: AC

## 2021-03-05 ENCOUNTER — Other Ambulatory Visit (HOSPITAL_BASED_OUTPATIENT_CLINIC_OR_DEPARTMENT_OTHER): Payer: Self-pay

## 2021-03-06 ENCOUNTER — Other Ambulatory Visit (HOSPITAL_BASED_OUTPATIENT_CLINIC_OR_DEPARTMENT_OTHER): Payer: Self-pay

## 2021-03-13 ENCOUNTER — Other Ambulatory Visit (HOSPITAL_BASED_OUTPATIENT_CLINIC_OR_DEPARTMENT_OTHER): Payer: Self-pay

## 2021-03-13 MED FILL — Ramipril Cap 10 MG: ORAL | 90 days supply | Qty: 90 | Fill #0 | Status: AC

## 2021-03-13 MED FILL — Atorvastatin Calcium Tab 80 MG (Base Equivalent): ORAL | 90 days supply | Qty: 90 | Fill #0 | Status: AC

## 2021-03-14 ENCOUNTER — Other Ambulatory Visit (HOSPITAL_BASED_OUTPATIENT_CLINIC_OR_DEPARTMENT_OTHER): Payer: Self-pay

## 2021-03-14 MED ORDER — HYDROCODONE-ACETAMINOPHEN 10-325 MG PO TABS
ORAL_TABLET | ORAL | 0 refills | Status: DC
  Filled 2021-03-14: qty 120, 30d supply, fill #0

## 2021-03-20 ENCOUNTER — Other Ambulatory Visit (HOSPITAL_BASED_OUTPATIENT_CLINIC_OR_DEPARTMENT_OTHER): Payer: Self-pay

## 2021-04-10 ENCOUNTER — Other Ambulatory Visit (HOSPITAL_BASED_OUTPATIENT_CLINIC_OR_DEPARTMENT_OTHER): Payer: Self-pay

## 2021-04-11 ENCOUNTER — Other Ambulatory Visit (HOSPITAL_BASED_OUTPATIENT_CLINIC_OR_DEPARTMENT_OTHER): Payer: Self-pay

## 2021-04-12 ENCOUNTER — Other Ambulatory Visit (HOSPITAL_BASED_OUTPATIENT_CLINIC_OR_DEPARTMENT_OTHER): Payer: Self-pay

## 2021-04-12 MED ORDER — HUMULIN R U-500 (CONCENTRATED) 500 UNIT/ML ~~LOC~~ SOLN
SUBCUTANEOUS | 3 refills | Status: DC
  Filled 2021-04-12: qty 20, 23d supply, fill #0
  Filled 2021-04-12: qty 60, 30d supply, fill #0
  Filled 2021-05-09: qty 20, 23d supply, fill #1
  Filled 2021-06-07: qty 20, 23d supply, fill #2
  Filled 2021-07-09: qty 20, 23d supply, fill #3
  Filled 2021-08-13: qty 20, 23d supply, fill #4
  Filled 2021-09-06: qty 20, 23d supply, fill #5
  Filled 2021-10-10: qty 20, 23d supply, fill #6
  Filled 2021-11-08: qty 20, 23d supply, fill #7
  Filled 2021-12-06: qty 20, 23d supply, fill #8
  Filled 2022-01-03: qty 20, 23d supply, fill #9
  Filled 2022-02-01: qty 20, 23d supply, fill #10
  Filled 2022-02-28: qty 20, 23d supply, fill #11

## 2021-04-12 MED ORDER — HYDROCODONE-ACETAMINOPHEN 10-325 MG PO TABS
ORAL_TABLET | ORAL | 0 refills | Status: DC
  Filled 2021-04-12: qty 120, 30d supply, fill #0

## 2021-04-13 ENCOUNTER — Other Ambulatory Visit (HOSPITAL_BASED_OUTPATIENT_CLINIC_OR_DEPARTMENT_OTHER): Payer: Self-pay

## 2021-04-25 NOTE — Progress Notes (Signed)
Cardiology Office Note:    Date:  04/26/2021   ID:  Alec [name], DOB 08-24-1961, MRN [id number]  PCP:  Reynold Bowen, MD  Cardiologist:  [name], MD   Referring MD: Reynold Bowen, MD   Chief Complaint  Patient presents with   Coronary Artery Disease    History of Present Illness:    Alec [name] is a 60 y.o. male with a hx of CAD, prior unstable angina, RCA DES 2015, mixed hyperlipidemia, OSA, morbid obesity, erectile dysfunction, and type 2 DM.  Alec [name] is doing well.  His job requires a fair amount of walking.  He is not having angina.  He is on medical therapy without side effects.  He has multiple risk factors.  Does not exercise on a regular basis but gets greater than 6000 steps per day at his job.  States he is compliant with CPAP.  Not doing a good job with dieting/carbohydrate reduction.  Does note dyspnea on exertion with walking up inclines.  This is been stable/unchanged over the past 2 years.  Past Medical History:  Diagnosis Date   Arthritis    CAD (coronary artery disease)    a. 04/27/14 DES to [facility]; widely patent L coronary system b. cath 08/13/2015 no changes, patent RCA   Chronic lower back pain    Depression    Gout    History of echocardiogram    a. 04/27/14 with EF 65-70% and mild TR.   History of pancreatitis    Metabolic syndrome    Mixed hyperlipidemia    Obesity (BMI 30-39.9) 02/16/2016   OSA (obstructive sleep apnea) 11/26/2015   Severe with total AHI 104.8/hr and oxygen desaturations as low as 79%.  On CPAP at 13cm H2O   Type 2 diabetes mellitus ([place])    on Insulin pump    Past Surgical History:  Procedure Laterality Date   ANTERIOR LAT LUMBAR FUSION Right 08/03/2020   Procedure: RIGHT LUMBAR THREE-LUMBAR FOUR LATERAL INTERBODY FUSION WITH INSTRUMENTATION AND ALLOGRAFT;  Surgeon: Phylliss Bob, MD;  Location: [place];  Service: Orthopedics;  Laterality: Right;  anterolateral   CARDIAC CATHETERIZATION N/A 08/14/2015   Procedure: Left  Heart Cath and Coronary Angiography;  Surgeon: Belva Crome, MD;  Location: [place] CV LAB;  Service: Cardiovascular;  Laterality: N/A;   CARPAL TUNNEL RELEASE Bilateral 2006-2008   left 12/2004; right 10/2006   CHONDROPLASTY  04/20/2015   Procedure: CHONDROPLASTY;  Surgeon: Melrose Nakayama, MD;  Location: [place];  Service: Orthopedics;;   FUNCTIONAL ENDOSCOPIC SINUS SURGERY  10/2001; 08/2005;    KNEE ARTHROSCOPY Bilateral 2000's   right 08/2003;    KNEE ARTHROSCOPY WITH MEDIAL MENISECTOMY Left 04/20/2015   Procedure: LEFT ARTHROSCOPY KNEE WITH MEDIAL MENISECTOMY;  Surgeon: Melrose Nakayama, MD;  Location: [place];  Service: Orthopedics;  Laterality: Left;  Partial medial menisectomy, chondroplasty.   LEFT HEART CATHETERIZATION WITH CORONARY ANGIOGRAM N/A 04/27/2014   Procedure: LEFT HEART CATHETERIZATION WITH CORONARY ANGIOGRAM;  Surgeon: [name], MD;  Location: [facility] CATH LAB;  Service: Cardiovascular;  Laterality: N/A;   LUMBAR LAMINECTOMY Left 05/2006   PERCUTANEOUS STENT INTERVENTION  04/27/2014   Procedure: PERCUTANEOUS STENT INTERVENTION;  Surgeon: [name], MD;  Location: [facility] CATH LAB;  Service: Cardiovascular;;  DES x2 (distal +prox RCA)   POSTERIOR LUMBAR FUSION  07/2009   SHOULDER ARTHROSCOPY Right ~ 2010    Current Medications: Current Meds  Medication Sig   Alirocumab ([place]) 150  MG/ML SOAJ Inject 150 mg into the skin every 14 (fourteen) days. Every other Sunday   allopurinol (ZYLOPRIM) 300 MG tablet TAKE 1 TABLET BY MOUTH ONCE DAILY   atorvastatin (LIPITOR) 80 MG tablet TAKE 1 TABLET BY MOUTH DAILY   cetirizine (ZYRTEC) 10 MG tablet Take 10 mg by mouth daily.   cyclobenzaprine (FLEXERIL) 5 MG tablet TAKE 1-2 TABLET BY MOUTH THREE TIMES A DAY AS NEEDED   dapagliflozin propanediol (FARXIGA) 10 MG TABS tablet TAKE 1 TABLET BY MOUTH ONCE DAILY   escitalopram (LEXAPRO) 20 MG tablet TAKE 1 TABLET (20 MG) BY MOUTH DAILY.    escitalopram (LEXAPRO) 20 MG tablet TAKE 1 TABLET BY MOUTH DAILY   Evolocumab (REPATHA SURECLICK) [id number] MG/ML SOAJ See admin instructions.   fenofibrate 160 MG tablet Take 1 tablet (160 mg total) by mouth daily. Please keep upcoming appt in August 2022 with Dr. Tamala Julian before anymore refills. Thank you Final Attempt   glucose blood test strip USE AS DIRECTED TO SELF-MONITOR BLOOD GLUCOSE UP TO 10 TIMES DAILY 30 (Patient taking differently: USE AS DIRECTED TO SELF-MONITOR BLOOD GLUCOSE UP TO 10 TIMES DAILY 30)   HYDROcodone-acetaminophen (NORCO) 10-325 MG tablet Take 1 tablet by mouth 4 times daily as needed every 6 hours as needed   icosapent Ethyl (VASCEPA) 1 g capsule TAKE 2 CAPSULES BY MOUTH TWICE DAILY   indomethacin (INDOCIN) 50 MG capsule Take 50 mg by mouth as needed.   insulin regular human CONCENTRATED (HUMULIN R) 500 UNIT/ML injection Inject into the skin continuous. Max 2 units per hour via insulin pump, bolus as needed blood suger   insulin regular human CONCENTRATED (HUMULIN R) 500 UNIT/ML injection USE IN INSULIN PUMP AS DIRECTED. USE 30 UNIT MARKINGS (150 UNITS) 3 TIMES A DAY   insulin regular human CONCENTRATED (HUMULIN R) 500 UNIT/ML injection USE IN INSULIN PUMP AS DIRECTED - USE 30 UNIT MARKINGS (150 UNITS) 3 TIMES A DAY   methocarbamol (ROBAXIN) 500 MG tablet TAKE 1 TABLET (500 MG TOTAL) BY MOUTH EVERY 6 (SIX) HOURS AS NEEDED FOR MUSCLE SPASMS.   metoprolol succinate (TOPROL-XL) 25 MG 24 hr tablet TAKE 1 TABLET BY MOUTH DAILY   montelukast (SINGULAIR) 10 MG tablet Take 1 tablet by mouth daily as needed   ONE TOUCH ULTRA TEST test strip 1 each by Other route as needed for other (blood sugar).    pregabalin (LYRICA) 50 MG capsule TAKE 1 CAPSULE BY MOUTH TWICE DAILY   ramipril (ALTACE) 10 MG capsule TAKE 1 CAPSULE BY MOUTH ONCE DAILY   sildenafil (VIAGRA) 100 MG tablet TAKE 1 TABLET BY MOUTH ONCE DAILY AS NEEDED   valACYclovir (VALTREX) 1000 MG tablet Take 1,000 mg by mouth daily as  needed (cold sores).    valACYclovir (VALTREX) 1000 MG tablet TAKE 2 TABLETS BY MOUTH EVERY 12 HOURS FOR 24 HOURS AS NEEDED FOR OUTBREAK AS DIRECTED   VASCEPA 1 G CAPS Take 2 g by mouth 2 (two) times daily.    [DISCONTINUED] NITROSTAT 0.4 MG SL tablet PLACE 1 TABLET (0.4 MG TOTAL) UNDER THE TONGUE EVERY 5 (FIVE) MINUTES X 3 DOSES AS NEEDED FOR CHEST PAIN. DIAL 911 AFTER 3 DOSES (Patient taking differently: Place 0.4 mg under the tongue every 5 (five) minutes as needed for chest pain.)     Allergies:   Hydrocodone, Oxycodone-acetaminophen, and Percocet [oxycodone-acetaminophen]   Social History   Socioeconomic History   Marital status: Married    Spouse name: Not on file   Number of children: 2  Years of education: Not on file   Highest education level: Not on file  Occupational History   Occupation: Actuary  Tobacco Use   Smoking status: Passive Smoke Exposure - Never Smoker   Smokeless tobacco: Never   Tobacco comments:    tried for less than a year when 45  Vaping Use   Vaping Use: Never used  Substance and Sexual Activity   Alcohol use: Yes    Comment: occ   Drug use: No   Sexual activity: Yes  Other Topics Concern   Not on file  Social History Narrative   Not on file   Social Determinants of Health   Financial Resource Strain: Not on file  Food Insecurity: Not on file  Transportation Needs: Not on file  Physical Activity: Not on file  Stress: Not on file  Social Connections: Not on file     Family History: The patient's family history includes Asthma in his son; Coronary artery disease in his mother; Diabetes in his father and mother; Emphysema in his mother; Heart attack in his mother; Lung cancer in his father; Prostate cancer in his father. There is no history of Colon cancer.  ROS:   Please see the history of present illness.    Chronic back discomfort with radiculopathy.  Relatively stable.  He does not smoke.  He is compliant with CPAP.  States he  sleeps well.  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  He did not do a lot of walking.  He was working in a Systems analyst.  All other systems reviewed and are negative.  EKGs/Labs/Other Studies Reviewed:    The following studies were reviewed today: No recent imaging  EKG:  EKG normal sinus rhythm.  Small inferior Q waves.  Early QRS transition.  Otherwise normal.  No change when compared to the prior tracing from [place]: 08/01/2020: ALT 39; BUN 14; Creatinine, Ser 0.80; Hemoglobin 15.6; Platelets 199; Potassium 4.1; Sodium 139  Recent Lipid Panel    Component Value Date/Time   CHOL 349 (H) 10/13/2015 0858   TRIG 2,598 (H) 10/13/2015 0858   HDL 27 (L) 10/13/2015 0858   CHOLHDL 12.9 (H) 10/13/2015 0858   VLDL NOT CALC 10/13/2015 0858   LDLCALC NOT CALC 10/13/2015 0858   LDLDIRECT 65 10/13/2015 0858    Physical Exam:    VS:  BP (!) 148/86   Pulse 84   Ht '5\' 10"'$  (1.778 m)   Wt 284 lb 10.1 oz (129.1 kg)   SpO2 96%   BMI 40.84 kg/m     Wt Readings from Last 3 Encounters:  04/26/21 284 lb 10.1 oz (129.1 kg)  01/30/21 285 lb 6.4 oz (129.5 kg)  08/01/20 290 lb 6.4 oz (131.7 kg)     GEN: Morbidly obese. No acute distress HEENT: Normal NECK: No JVD. LYMPHATICS: No lymphadenopathy CARDIAC: No murmur. RRR S4 gallop, no edema. VASCULAR:  Normal Pulses. No bruits. RESPIRATORY:  Clear to auscultation without rales, wheezing or rhonchi  ABDOMEN: Soft, non-tender, non-distended, No pulsatile mass, MUSCULOSKELETAL: No deformity  SKIN: Warm and dry NEUROLOGIC:  Alert and oriented x 3 PSYCHIATRIC:  Normal affect   ASSESSMENT:    1. Coronary artery disease involving native coronary artery of native heart without angina pectoris   2. Morbid obesity ([place])   3. Essential hypertension   4. OSA (obstructive sleep apnea)   5. Hypercholesterolemia    PLAN:    In  order of problems listed above:  Secondary prevention discussed.  We talked  about the importance of 150 minutes of moderate aerobic activity.  States he has held back by chronic back discomfort.  Encouraged finding other aerobic activities and can be substituted on top of the physical activity he gets at his job. Consider a weight loss program. He has not had medications yet today.  Blood pressure of 148/86 is noted but given no therapy on board, no changes are made. Compliant with CPAP He is on high intensity statin therapy with Lipitor 80 as well as fenofibrate, Vascepa, and Repatha.  Most recent LDL was 8.  Total cholesterol 190.  Triglyceride 627.  This data was from March 2022. Wilder Glade is being well-tolerated.  Goal is A1c less than 7.   Overall education and awareness concerning primary/secondary risk prevention was discussed in detail: LDL less than 70, hemoglobin A1c less than 7, blood pressure target less than 130/80 mmHg, >150 minutes of moderate aerobic activity per week, avoidance of smoking, weight control (via diet and exercise), and continued surveillance/management of/for obstructive sleep apnea.    Medication Adjustments/Labs and Tests Ordered: Current medicines are reviewed at length with the patient today.  Concerns regarding medicines are outlined above.  Orders Placed This Encounter  Procedures   EKG 12-Lead   Meds ordered this encounter  Medications   nitroGLYCERIN (NITROSTAT) 0.4 MG SL tablet    Sig: PLACE 1 TABLET (0.4 MG TOTAL) UNDER THE TONGUE EVERY 5 (FIVE) MINUTES X 3 DOSES AS NEEDED FOR CHEST PAIN. DIAL 911 AFTER 3 DOSES    Dispense:  25 tablet    Refill:  3    Patient Instructions  Medication Instructions:  Your physician recommends that you continue on your current medications as directed. Please refer to the Current Medication list given to you today.  *If you need a refill on your cardiac medications before your next appointment, please call your pharmacy*   Lab Work: None If you have labs (blood work) drawn today and your  tests are completely normal, you will receive your results only by: [place] (if you have MyChart) OR A paper copy in the mail If you have any lab test that is abnormal or we need to change your treatment, we will call you to review the results.   Testing/Procedures: None   Follow-Up: At [facility], you and your health needs are our priority.  As part of our continuing mission to provide you with exceptional heart care, we have created designated Provider Care Teams.  These Care Teams include your primary Cardiologist (physician) and Advanced Practice Providers (APPs -  Physician Assistants and Nurse Practitioners) who all work together to provide you with the care you need, when you need it.  We recommend signing up for the patient portal called "MyChart".  Sign up information is provided on this After Visit Summary.  MyChart is used to connect with patients for Virtual Visits (Telemedicine).  Patients are able to view lab/test results, encounter notes, upcoming appointments, etc.  Non-urgent messages can be sent to your provider as well.   To learn more about what you can do with MyChart, go to [email or web address].    Your next appointment:   1 year(s)  The format for your next appointment:   In Person  Provider:   You may see [name], MD or one of the following Advanced Practice Providers on your designated Care Team:   Cecilie Kicks, NP   Other  Instructions     Signed, [name], MD  04/26/2021 11:18 AM    [place]

## 2021-04-26 ENCOUNTER — Other Ambulatory Visit (HOSPITAL_BASED_OUTPATIENT_CLINIC_OR_DEPARTMENT_OTHER): Payer: Self-pay

## 2021-04-26 ENCOUNTER — Ambulatory Visit: Payer: PRIVATE HEALTH INSURANCE | Admitting: Interventional Cardiology

## 2021-04-26 ENCOUNTER — Encounter: Payer: Self-pay | Admitting: Interventional Cardiology

## 2021-04-26 ENCOUNTER — Other Ambulatory Visit: Payer: Self-pay

## 2021-04-26 VITALS — BP 148/86 | HR 84 | Ht 70.0 in | Wt 284.6 lb

## 2021-04-26 DIAGNOSIS — I10 Essential (primary) hypertension: Secondary | ICD-10-CM

## 2021-04-26 DIAGNOSIS — I2510 Atherosclerotic heart disease of native coronary artery without angina pectoris: Secondary | ICD-10-CM | POA: Diagnosis not present

## 2021-04-26 DIAGNOSIS — E7800 Pure hypercholesterolemia, unspecified: Secondary | ICD-10-CM

## 2021-04-26 DIAGNOSIS — G4733 Obstructive sleep apnea (adult) (pediatric): Secondary | ICD-10-CM | POA: Diagnosis not present

## 2021-04-26 MED ORDER — NITROGLYCERIN 0.4 MG SL SUBL
SUBLINGUAL_TABLET | SUBLINGUAL | 3 refills | Status: DC
  Filled 2021-04-26: qty 25, 30d supply, fill #0
  Filled 2021-05-08: qty 25, 10d supply, fill #0
  Filled 2022-03-22: qty 25, 10d supply, fill #1

## 2021-04-26 NOTE — Patient Instructions (Signed)

## 2021-05-07 ENCOUNTER — Other Ambulatory Visit (HOSPITAL_BASED_OUTPATIENT_CLINIC_OR_DEPARTMENT_OTHER): Payer: Self-pay

## 2021-05-08 ENCOUNTER — Other Ambulatory Visit (HOSPITAL_BASED_OUTPATIENT_CLINIC_OR_DEPARTMENT_OTHER): Payer: Self-pay

## 2021-05-08 MED FILL — Dapagliflozin Propanediol Tab 10 MG (Base Equivalent): ORAL | 90 days supply | Qty: 90 | Fill #1 | Status: AC

## 2021-05-09 ENCOUNTER — Other Ambulatory Visit (HOSPITAL_BASED_OUTPATIENT_CLINIC_OR_DEPARTMENT_OTHER): Payer: Self-pay

## 2021-05-09 MED ORDER — VALACYCLOVIR HCL 1 G PO TABS
ORAL_TABLET | ORAL | 0 refills | Status: DC
  Filled 2021-05-09: qty 30, 7d supply, fill #0

## 2021-05-09 MED FILL — Pregabalin Cap 50 MG: ORAL | 30 days supply | Qty: 60 | Fill #1 | Status: AC

## 2021-05-09 MED FILL — Glucose Blood Test Strip: 30 days supply | Qty: 300 | Fill #0 | Status: CN

## 2021-05-10 ENCOUNTER — Other Ambulatory Visit (HOSPITAL_BASED_OUTPATIENT_CLINIC_OR_DEPARTMENT_OTHER): Payer: Self-pay

## 2021-05-10 MED ORDER — HYDROCODONE-ACETAMINOPHEN 10-325 MG PO TABS
1.0000 | ORAL_TABLET | Freq: Four times a day (QID) | ORAL | 0 refills | Status: DC | PRN
  Filled 2021-05-10: qty 120, 30d supply, fill #0

## 2021-06-07 ENCOUNTER — Other Ambulatory Visit (HOSPITAL_BASED_OUTPATIENT_CLINIC_OR_DEPARTMENT_OTHER): Payer: Self-pay

## 2021-06-12 ENCOUNTER — Other Ambulatory Visit (HOSPITAL_BASED_OUTPATIENT_CLINIC_OR_DEPARTMENT_OTHER): Payer: Self-pay

## 2021-06-12 MED ORDER — HYDROCODONE-ACETAMINOPHEN 10-325 MG PO TABS
1.0000 | ORAL_TABLET | Freq: Four times a day (QID) | ORAL | 0 refills | Status: DC | PRN
  Filled 2021-06-12: qty 120, 30d supply, fill #0

## 2021-06-12 MED FILL — Metoprolol Succinate Tab ER 24HR 25 MG (Tartrate Equiv): ORAL | 90 days supply | Qty: 90 | Fill #1 | Status: AC

## 2021-07-03 ENCOUNTER — Other Ambulatory Visit (HOSPITAL_BASED_OUTPATIENT_CLINIC_OR_DEPARTMENT_OTHER): Payer: Self-pay

## 2021-07-03 ENCOUNTER — Other Ambulatory Visit: Payer: Self-pay | Admitting: Interventional Cardiology

## 2021-07-03 MED ORDER — FENOFIBRATE 160 MG PO TABS
160.0000 mg | ORAL_TABLET | Freq: Every day | ORAL | 3 refills | Status: DC
  Filled 2021-07-03: qty 90, 90d supply, fill #0
  Filled 2021-10-10: qty 90, 90d supply, fill #1
  Filled 2022-02-07: qty 90, 90d supply, fill #2
  Filled 2022-05-27: qty 90, 90d supply, fill #3

## 2021-07-03 MED FILL — Atorvastatin Calcium Tab 80 MG (Base Equivalent): ORAL | 90 days supply | Qty: 90 | Fill #1 | Status: AC

## 2021-07-03 MED FILL — Icosapent Ethyl Cap 1 GM: ORAL | 30 days supply | Qty: 120 | Fill #2 | Status: AC

## 2021-07-05 ENCOUNTER — Other Ambulatory Visit (HOSPITAL_BASED_OUTPATIENT_CLINIC_OR_DEPARTMENT_OTHER): Payer: Self-pay

## 2021-07-09 ENCOUNTER — Other Ambulatory Visit (HOSPITAL_BASED_OUTPATIENT_CLINIC_OR_DEPARTMENT_OTHER): Payer: Self-pay

## 2021-07-10 MED FILL — Ramipril Cap 10 MG: ORAL | 90 days supply | Qty: 90 | Fill #1 | Status: AC

## 2021-07-11 ENCOUNTER — Other Ambulatory Visit (HOSPITAL_BASED_OUTPATIENT_CLINIC_OR_DEPARTMENT_OTHER): Payer: Self-pay

## 2021-07-11 MED ORDER — HYDROCODONE-ACETAMINOPHEN 10-325 MG PO TABS
1.0000 | ORAL_TABLET | Freq: Four times a day (QID) | ORAL | 0 refills | Status: DC | PRN
  Filled 2021-07-11: qty 120, 30d supply, fill #0

## 2021-07-13 ENCOUNTER — Other Ambulatory Visit (HOSPITAL_BASED_OUTPATIENT_CLINIC_OR_DEPARTMENT_OTHER): Payer: Self-pay

## 2021-07-18 ENCOUNTER — Other Ambulatory Visit (HOSPITAL_BASED_OUTPATIENT_CLINIC_OR_DEPARTMENT_OTHER): Payer: Self-pay

## 2021-07-18 MED ORDER — PREGABALIN 50 MG PO CAPS
50.0000 mg | ORAL_CAPSULE | Freq: Two times a day (BID) | ORAL | 5 refills | Status: DC
  Filled 2021-07-18 – 2021-07-30 (×2): qty 60, 30d supply, fill #0
  Filled 2021-08-30: qty 60, 30d supply, fill #1
  Filled 2021-10-10: qty 60, 30d supply, fill #2
  Filled 2022-02-07: qty 60, 30d supply, fill #3
  Filled 2022-04-02: qty 60, 30d supply, fill #4
  Filled 2022-05-27: qty 60, 30d supply, fill #5

## 2021-07-27 ENCOUNTER — Other Ambulatory Visit (HOSPITAL_BASED_OUTPATIENT_CLINIC_OR_DEPARTMENT_OTHER): Payer: Self-pay

## 2021-07-30 ENCOUNTER — Other Ambulatory Visit (HOSPITAL_BASED_OUTPATIENT_CLINIC_OR_DEPARTMENT_OTHER): Payer: Self-pay

## 2021-08-08 ENCOUNTER — Other Ambulatory Visit (HOSPITAL_BASED_OUTPATIENT_CLINIC_OR_DEPARTMENT_OTHER): Payer: Self-pay

## 2021-08-11 ENCOUNTER — Encounter (HOSPITAL_BASED_OUTPATIENT_CLINIC_OR_DEPARTMENT_OTHER): Payer: Self-pay | Admitting: Emergency Medicine

## 2021-08-11 ENCOUNTER — Other Ambulatory Visit: Payer: Self-pay

## 2021-08-11 ENCOUNTER — Emergency Department (HOSPITAL_BASED_OUTPATIENT_CLINIC_OR_DEPARTMENT_OTHER): Payer: PRIVATE HEALTH INSURANCE

## 2021-08-11 ENCOUNTER — Emergency Department (HOSPITAL_BASED_OUTPATIENT_CLINIC_OR_DEPARTMENT_OTHER)
Admission: EM | Admit: 2021-08-11 | Discharge: 2021-08-11 | Disposition: A | Discharge status: Home / Self Care | Payer: PRIVATE HEALTH INSURANCE | Attending: Emergency Medicine | Admitting: Emergency Medicine

## 2021-08-11 DIAGNOSIS — R109 Unspecified abdominal pain: Secondary | ICD-10-CM | POA: Diagnosis present

## 2021-08-11 DIAGNOSIS — N12 Tubulo-interstitial nephritis, not specified as acute or chronic: Secondary | ICD-10-CM | POA: Diagnosis not present

## 2021-08-11 DIAGNOSIS — Z7722 Contact with and (suspected) exposure to environmental tobacco smoke (acute) (chronic): Secondary | ICD-10-CM | POA: Diagnosis not present

## 2021-08-11 DIAGNOSIS — Z794 Long term (current) use of insulin: Secondary | ICD-10-CM | POA: Insufficient documentation

## 2021-08-11 DIAGNOSIS — K760 Fatty (change of) liver, not elsewhere classified: Secondary | ICD-10-CM | POA: Diagnosis not present

## 2021-08-11 DIAGNOSIS — I2510 Atherosclerotic heart disease of native coronary artery without angina pectoris: Secondary | ICD-10-CM | POA: Diagnosis not present

## 2021-08-11 DIAGNOSIS — Z79899 Other long term (current) drug therapy: Secondary | ICD-10-CM | POA: Diagnosis not present

## 2021-08-11 DIAGNOSIS — Z8616 Personal history of COVID-19: Secondary | ICD-10-CM | POA: Diagnosis not present

## 2021-08-11 DIAGNOSIS — E119 Type 2 diabetes mellitus without complications: Secondary | ICD-10-CM | POA: Diagnosis not present

## 2021-08-11 LAB — URINALYSIS, ROUTINE W REFLEX MICROSCOPIC
Bilirubin Urine: NEGATIVE
Glucose, UA: >=500 mg/dL — AB
Hgb urine dipstick: NEGATIVE
Ketones, ur: NEGATIVE mg/dL
Leukocytes,Ua: NEGATIVE
Nitrite: NEGATIVE
Protein, ur: 100 mg/dL — AB
Specific Gravity, Urine: 1.02 (ref 1.005–1.030)
pH: 5 (ref 5.0–8.0)

## 2021-08-11 LAB — URINALYSIS, MICROSCOPIC (REFLEX)

## 2021-08-11 MED ORDER — CEFPODOXIME PROXETIL 200 MG PO TABS: 200.0000 mg | ORAL_TABLET | Freq: Two times a day (BID) | ORAL | 0 refills | Status: AC

## 2021-08-11 NOTE — ED Triage Notes (Signed)
Pt c/o LT flank pain and difficulty urinating since Wed

## 2021-08-11 NOTE — Discharge Instructions (Addendum)
You were evaluated in the Emergency Department and after careful evaluation, we did not find any emergent condition requiring admission or further testing in the hospital.  Your exam/testing today was overall reassuring.  Your CT scan was negative for acute abnormalities with no evidence of kidney stones present.  Your urinalysis did show many bacteria which in the setting of your left flank pain could be consistent with an early developing kidney infection.  We will treat with a course of antibiotics.  Please return to the Emergency Department if you experience any worsening of your condition.  Thank you for allowing [place] to be a part of your care.

## 2021-08-11 NOTE — ED Notes (Signed)
Bladder Scan done 355ml read  Patient voided 466ml Scan recheck  Volume 73ml

## 2021-08-11 NOTE — ED Provider Notes (Signed)
[facility] EMERGENCY DEPARTMENT Provider Note   CSN: [id number] Arrival date & time: 08/11/21  1101     History Chief Complaint  Patient presents with   Flank Pain    Alec [name] is a 60 y.o. male.   Flank Pain Pertinent negatives include no chest pain, no abdominal pain and no shortness of breath.   60 year old male presenting to the emergency department with a chief complaint of left-sided flank pain and intermittent difficulty with urinating since this past Wednesday.  He endorses a sharp, moderate intensity flank pain with no aggravating or alleviating factors.  Denies any hematuria, dysuria and increased urinary frequency.  Denies any fevers or chills.  No abdominal pain, nausea or vomiting.  Past Medical History:  Diagnosis Date   Arthritis    CAD (coronary artery disease)    a. 04/27/14 DES to [facility]; widely patent L coronary system b. cath 08/13/2015 no changes, patent RCA   Chronic lower back pain    Depression    Gout    History of echocardiogram    a. 04/27/14 with EF 65-70% and mild TR.   History of pancreatitis    Metabolic syndrome    Mixed hyperlipidemia    Obesity (BMI 30-39.9) 02/16/2016   OSA (obstructive sleep apnea) 11/26/2015   Severe with total AHI 104.8/hr and oxygen desaturations as low as 79%.  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[place]) 08/03/2020   Shortness of breath 10/04/2019   Sinusitis, acute maxillary 09/30/2019   COVID-19 virus detected 09/30/2019   Epistaxis 06/24/2016   On anticoagulant therapy 06/24/2016   Obesity (BMI 30-39.9) 02/16/2016   OSA (obstructive sleep apnea) 11/26/2015   Controlled type 2 diabetes mellitus ([place])    Acute chest pain 08/12/2015   Hypertriglyceridemia 04/28/2014   CAD (coronary artery disease)    Metabolic syndrome    History of echocardiogram    Depression    Gout     Unstable angina ([place]) 04/26/2014   Type 2 diabetes mellitus ([place]) 04/26/2014   Mixed hyperlipidemia 04/26/2014    Past Surgical History:  Procedure Laterality Date   ANTERIOR LAT LUMBAR FUSION Right 08/03/2020   Procedure: RIGHT LUMBAR THREE-LUMBAR FOUR LATERAL INTERBODY FUSION WITH INSTRUMENTATION AND ALLOGRAFT;  Surgeon: Phylliss Bob, MD;  Location: [facility];  Service: Orthopedics;  Laterality: Right;  anterolateral   CARDIAC CATHETERIZATION N/A 08/14/2015   Procedure: Left Heart Cath and Coronary Angiography;  Surgeon: Belva Crome, MD;  Location: [facility];  Service: Cardiovascular;  Laterality: N/A;   CARPAL TUNNEL RELEASE Bilateral 2006-2008   left 12/2004; right 10/2006   CHONDROPLASTY  04/20/2015   Procedure: CHONDROPLASTY;  Surgeon: Melrose Nakayama, MD;  Location: [facility];  Service: Orthopedics;;   FUNCTIONAL ENDOSCOPIC SINUS SURGERY  10/2001; 08/2005;    KNEE ARTHROSCOPY Bilateral 2000's   right 08/2003;    KNEE ARTHROSCOPY WITH MEDIAL MENISECTOMY Left 04/20/2015   Procedure: LEFT ARTHROSCOPY KNEE WITH MEDIAL MENISECTOMY;  Surgeon: Melrose Nakayama, MD;  Location: [facility];  Service: Orthopedics;  Laterality: Left;  Partial medial menisectomy, chondroplasty.   LEFT HEART CATHETERIZATION WITH CORONARY ANGIOGRAM N/A 04/27/2014   Procedure: LEFT HEART CATHETERIZATION WITH CORONARY ANGIOGRAM;  Surgeon: [name], MD;  Location[facility] CATH LAB;  Service: Cardiovascular;  Laterality: N/A;   LUMBAR LAMINECTOMY Left  05/2006   PERCUTANEOUS STENT INTERVENTION  04/27/2014   Procedure: PERCUTANEOUS STENT INTERVENTION;  Surgeon: [name], MD;  Location: [facility] CATH LAB;  Service: Cardiovascular;;  DES x2 (distal +prox RCA)   POSTERIOR LUMBAR FUSION  07/2009   SHOULDER ARTHROSCOPY Right ~ 2010       Family History  Problem Relation Age of Onset   Coronary artery disease Mother        MI in her 41's   Heart attack Mother    Diabetes Mother     Emphysema Mother    Prostate cancer Father    Lung cancer Father    Diabetes Father    Asthma Son    Colon cancer Neg Hx     Social History   Tobacco Use   Smoking status: Passive Smoke Exposure - Never Smoker   Smokeless tobacco: Never   Tobacco comments:    tried for less than a year when 38  Vaping Use   Vaping Use: Never used  Substance Use Topics   Alcohol use: Yes    Comment: occ   Drug use: No    Home Medications Prior to Admission medications   Medication Sig Start Date End Date Taking? Authorizing Provider  cefpodoxime (VANTIN) 200 MG tablet Take 1 tablet (200 mg total) by mouth 2 (two) times daily for 10 days. 08/11/21 08/21/21 Yes Regan Lemming, MD  Alirocumab (PRALUENT) 150 MG/ML SOAJ Inject 150 mg into the skin every 14 (fourteen) days. Every other Sunday    [provider]  allopurinol (ZYLOPRIM) 300 MG tablet TAKE 1 TABLET BY MOUTH ONCE DAILY 01/19/21 01/19/22  Reynold Bowen, MD  atorvastatin (LIPITOR) 80 MG tablet TAKE 1 TABLET BY MOUTH DAILY 11/28/20 11/28/21  Reynold Bowen, MD  cetirizine (ZYRTEC) 10 MG tablet Take 10 mg by mouth daily.    [provider]  COVID-19 mRNA Vac-TriS, Pfizer, SUSP injection INJECT AS DIRECTED Patient not taking: Reported on 04/26/2021 10/23/20 10/23/21  Carlyle Basques, MD  cyclobenzaprine (FLEXERIL) 5 MG tablet TAKE 1-2 TABLET BY MOUTH THREE TIMES A DAY AS NEEDED 10/06/20 10/06/21  McKenzie, Lennie Muckle, PA-C  dapagliflozin propanediol (FARXIGA) 10 MG TABS tablet TAKE 1 TABLET BY MOUTH ONCE DAILY 10/10/20 10/10/21  Reynold Bowen, MD  escitalopram (LEXAPRO) 20 MG tablet TAKE 1 TABLET (20 MG) BY MOUTH DAILY. 06/23/15   Belva Crome, MD  escitalopram (LEXAPRO) 20 MG tablet TAKE 1 TABLET BY MOUTH DAILY 02/21/21     Evolocumab (REPATHA SURECLICK) 737 MG/ML SOAJ See admin instructions. 09/26/20   [provider]  fenofibrate 160 MG tablet Take 1 tablet (160 mg total) by mouth daily. 07/03/21   Belva Crome, MD  glucose blood  test strip USE AS DIRECTED TO SELF-MONITOR BLOOD GLUCOSE UP TO 10 TIMES DAILY 30 Patient taking differently: USE AS DIRECTED TO SELF-MONITOR BLOOD GLUCOSE UP TO 10 TIMES DAILY 30 08/22/20 08/22/21  Reynold Bowen, MD  HYDROcodone-acetaminophen [facility]) 10-325 MG tablet Take 1 tablet by mouth 4 times daily as needed every 6 hours as needed 04/12/21     HYDROcodone-acetaminophen (NORCO) 10-325 MG tablet Take 1 tablet by mouth 4 (four) times daily (every 6 hours) as needed for pain. 07/11/21     icosapent Ethyl (VASCEPA) 1 g capsule TAKE 2 CAPSULES BY MOUTH TWICE DAILY 12/13/20 12/13/21  Reynold Bowen, MD  indomethacin (INDOCIN) 50 MG capsule Take 50 mg by mouth as needed. 10/19/13   [provider]  insulin regular human CONCENTRATED (HUMULIN R) 500  UNIT/ML injection Inject into the skin continuous. Max 2 units per hour via insulin pump, bolus as needed blood suger    [provider]  insulin regular human CONCENTRATED (HUMULIN R) 500 UNIT/ML injection USE IN INSULIN PUMP AS DIRECTED. USE 30 UNIT MARKINGS (150 UNITS) 3 TIMES A DAY 10/10/20 10/10/21  Reynold Bowen, MD  insulin regular human CONCENTRATED (HUMULIN R) 500 UNIT/ML injection USE IN INSULIN PUMP AS DIRECTED - USE 30 UNIT MARKINGS (150 UNITS) 3 TIMES A DAY 04/12/21     metoprolol succinate (TOPROL-XL) 25 MG 24 hr tablet TAKE 1 TABLET BY MOUTH DAILY 10/10/20 10/10/21  Reynold Bowen, MD  montelukast (SINGULAIR) 10 MG tablet Take 1 tablet by mouth daily as needed 01/09/21     nitroGLYCERIN (NITROSTAT) 0.4 MG SL tablet PLACE 1 TABLET (0.4 MG TOTAL) UNDER THE TONGUE EVERY 5 (FIVE) MINUTES X 3 DOSES AS NEEDED FOR CHEST PAIN. DIAL 911 AFTER 3 DOSES 04/26/21   Belva Crome, MD  ONE TOUCH ULTRA TEST test strip 1 each by Other route as needed for other (blood sugar).  08/16/15   [provider]  pregabalin (LYRICA) 50 MG capsule TAKE 1 CAPSULE BY MOUTH TWICE DAILY 12/13/20 06/11/21  Reynold Bowen, MD  pregabalin (LYRICA) 50 MG capsule TAKE  1 CAPSULE BY MOUTH TWICE DAILY 07/18/21     ramipril (ALTACE) 10 MG capsule TAKE 1 CAPSULE BY MOUTH ONCE DAILY 08/29/20 10/11/21  Reynold Bowen, MD  sildenafil (VIAGRA) 100 MG tablet TAKE 1 TABLET BY MOUTH ONCE DAILY AS NEEDED 07/13/20 07/13/21  Reynold Bowen, MD  valACYclovir (VALTREX) 1000 MG tablet Take 1,000 mg by mouth daily as needed (cold sores).  10/02/18   [provider]  valACYclovir (VALTREX) 1000 MG tablet TAKE 2 TABLETS BY MOUTH EVERY 12 HOURS FOR 24 HOURS AS NEEDED FOR OUTBREAK AS DIRECTED 05/09/21     VASCEPA 1 G CAPS Take 2 g by mouth 2 (two) times daily.  03/03/14   [provider]    Allergies    Hydrocodone, Oxycodone-acetaminophen, and Percocet [oxycodone-acetaminophen]  Review of Systems   Review of Systems  Constitutional:  Negative for chills and fever.  HENT:  Negative for ear pain and sore throat.   Eyes:  Negative for pain and visual disturbance.  Respiratory:  Negative for cough and shortness of breath.   Cardiovascular:  Negative for chest pain and palpitations.  Gastrointestinal:  Negative for abdominal pain and vomiting.  Genitourinary:  Positive for flank pain. Negative for dysuria and hematuria.  Musculoskeletal:  Negative for arthralgias and back pain.  Skin:  Negative for color change and rash.  Neurological:  Negative for seizures and syncope.  All other systems reviewed and are negative.  Physical Exam Updated Vital Signs BP 133/82   Pulse 61   Temp 97.8 F (36.6 C) (Oral)   Resp 16   Ht 5\' 11"  (1.803 m)   Wt 124.7 kg   SpO2 94%   BMI 38.35 kg/m   Physical Exam Vitals and nursing note reviewed.  Constitutional:      General: He is not in acute distress.    Appearance: He is well-developed.  HENT:     Head: Normocephalic and atraumatic.  Eyes:     Conjunctiva/sclera: Conjunctivae normal.     Pupils: Pupils are equal, round, and reactive to light.  Cardiovascular:     Rate and Rhythm: Normal rate and regular rhythm.      Heart sounds: No murmur heard. Pulmonary:  Effort: Pulmonary effort is normal. No respiratory distress.     Breath sounds: Normal breath sounds.  Abdominal:     General: There is no distension.     Palpations: Abdomen is soft.     Tenderness: There is no abdominal tenderness. There is left CVA tenderness. There is no right CVA tenderness or guarding.  Musculoskeletal:        General: No swelling, deformity or signs of injury.     Cervical back: Neck supple.  Skin:    General: Skin is warm and dry.     Capillary Refill: Capillary refill takes less than 2 seconds.     Findings: No lesion or rash.  Neurological:     General: No focal deficit present.     Mental Status: He is alert. Mental status is at baseline.  Psychiatric:        Mood and Affect: Mood normal.    ED Results / Procedures / Treatments   Labs (all labs ordered are listed, but only abnormal results are displayed) Labs Reviewed  URINALYSIS, ROUTINE W REFLEX MICROSCOPIC - Abnormal; Notable for the following components:      Result Value   Glucose, UA >=500 (*)    Protein, ur 100 (*)    All other components within normal limits  URINALYSIS, MICROSCOPIC (REFLEX) - Abnormal; Notable for the following components:   Bacteria, UA MANY (*)    All other components within normal limits    EKG None  Radiology CT Renal Stone Study  Result Date: 08/11/2021 CLINICAL DATA:  Left flank pain for 4 days.  Dysuria. EXAM: CT ABDOMEN AND PELVIS WITHOUT CONTRAST TECHNIQUE: Multidetector CT imaging of the abdomen and pelvis was performed following the standard protocol without IV contrast. COMPARISON:  Abdomen only CT on 10/22/2007 FINDINGS: Lower chest: No acute findings. Hepatobiliary: Moderate diffuse hepatic steatosis again noted. Gallbladder is unremarkable. No evidence of biliary ductal dilatation. No mass visualized on this unenhanced exam. Pancreas: No mass or inflammatory process visualized on this unenhanced exam. Spleen:   Within normal limits in size. Adrenals/Urinary tract: No evidence of urolithiasis or hydronephrosis. A few tiny right renal cysts are again noted. Unremarkable unopacified urinary bladder. Stomach/Bowel: No evidence of obstruction, inflammatory process, or abnormal fluid collections. Normal appendix visualized. Vascular/Lymphatic: No pathologically enlarged lymph nodes identified. No evidence of abdominal aortic aneurysm. Aortic atherosclerotic calcification noted. Reproductive:  No mass or other significant abnormality. Other:  None. Musculoskeletal: No suspicious bone lesions identified. Lower lumbar spine fusion hardware noted from levels of L3-S1. IMPRESSION: No evidence of urolithiasis, hydronephrosis, or other acute findings. Stable moderate hepatic steatosis. Aortic Atherosclerosis (ICD10-I70.0). Electronically Signed   By: Marlaine Hind M.D.   On: 08/11/2021 14:44    Procedures Procedures   Medications Ordered in ED Medications - No data to display  ED Course  I have reviewed the triage vital signs and the nursing notes.  Pertinent labs & imaging results that were available during my care of the patient were reviewed by me and considered in my medical decision making (see chart for details).    MDM Rules/Calculators/A&P                           60 year old male presenting to the emergency department with a chief complaint of left-sided flank pain and intermittent difficulty with urinating since this past Wednesday.  He endorses a sharp, moderate intensity flank pain with no aggravating or alleviating factors.  Denies any  hematuria, dysuria and increased urinary frequency.  Denies any fevers or chills.  No abdominal pain, nausea or vomiting.  On arrival, the patient was afebrile, hemodynamically stable, with mild left-sided CVA tenderness but no other significant abdominal tenderness.  The patient is tolerating oral intake and with no nausea or vomiting.  He initially had some difficulty  voiding with initial bladder scan of 307 but the patient subsequently then voided 400 cc without difficulty.  Scan on recheck was 0 cc.  Patient urinalysis positive for many bacteria which in the setting of the patient's CVA tenderness and flank pain is concerning for possible early pyelonephritis.  CT of the abdomen pelvis without contrast was performed to evaluate for nephrolithiasis.  CT imaging was negative for acute abnormalities to include no evidence of nephrolithiasis or urinary or ureteral obstruction.  No other acute abnormalities noted.  We will treat with a course of cefpodoxime for possible developing pyonephritis given the patient's urinalysis findings and symptoms.  Overall stable for discharge at this time.  Advised outpatient follow-up.   Final Clinical Impression(s) / ED Diagnoses Final diagnoses:  Left flank pain  Pyelonephritis    Rx / DC Orders ED Discharge Orders          Ordered    cefpodoxime (VANTIN) 200 MG tablet  2 times daily        08/11/21 1515             Regan Lemming, MD 08/11/21 [phone]

## 2021-08-13 ENCOUNTER — Other Ambulatory Visit (HOSPITAL_BASED_OUTPATIENT_CLINIC_OR_DEPARTMENT_OTHER): Payer: Self-pay

## 2021-08-13 MED ORDER — HYDROCODONE-ACETAMINOPHEN 10-325 MG PO TABS
ORAL_TABLET | ORAL | 0 refills | Status: DC
  Filled 2021-08-13: qty 120, 30d supply, fill #0

## 2021-08-15 ENCOUNTER — Other Ambulatory Visit (HOSPITAL_BASED_OUTPATIENT_CLINIC_OR_DEPARTMENT_OTHER): Payer: Self-pay

## 2021-08-15 MED ORDER — SILDENAFIL CITRATE 100 MG PO TABS
100.0000 mg | ORAL_TABLET | Freq: Every day | ORAL | 4 refills | Status: DC | PRN
  Filled 2021-08-15 – 2021-08-24 (×2): qty 30, 30d supply, fill #0

## 2021-08-24 ENCOUNTER — Other Ambulatory Visit (HOSPITAL_BASED_OUTPATIENT_CLINIC_OR_DEPARTMENT_OTHER): Payer: Self-pay

## 2021-08-30 ENCOUNTER — Other Ambulatory Visit (HOSPITAL_BASED_OUTPATIENT_CLINIC_OR_DEPARTMENT_OTHER): Payer: Self-pay

## 2021-09-06 ENCOUNTER — Other Ambulatory Visit (HOSPITAL_BASED_OUTPATIENT_CLINIC_OR_DEPARTMENT_OTHER): Payer: Self-pay

## 2021-09-06 MED FILL — Dapagliflozin Propanediol Tab 10 MG (Base Equivalent): ORAL | 90 days supply | Qty: 90 | Fill #2 | Status: AC

## 2021-09-07 ENCOUNTER — Other Ambulatory Visit (HOSPITAL_BASED_OUTPATIENT_CLINIC_OR_DEPARTMENT_OTHER): Payer: Self-pay

## 2021-09-11 ENCOUNTER — Other Ambulatory Visit (HOSPITAL_BASED_OUTPATIENT_CLINIC_OR_DEPARTMENT_OTHER): Payer: Self-pay

## 2021-09-11 MED ORDER — HYDROCODONE-ACETAMINOPHEN 10-325 MG PO TABS
ORAL_TABLET | ORAL | 0 refills | Status: DC
  Filled 2021-09-11: qty 120, 30d supply, fill #0

## 2021-09-21 ENCOUNTER — Other Ambulatory Visit (HOSPITAL_BASED_OUTPATIENT_CLINIC_OR_DEPARTMENT_OTHER): Payer: Self-pay

## 2021-09-21 MED FILL — Icosapent Ethyl Cap 1 GM: ORAL | 30 days supply | Qty: 120 | Fill #3 | Status: AC

## 2021-09-24 ENCOUNTER — Other Ambulatory Visit (HOSPITAL_BASED_OUTPATIENT_CLINIC_OR_DEPARTMENT_OTHER): Payer: Self-pay

## 2021-10-01 ENCOUNTER — Other Ambulatory Visit (HOSPITAL_BASED_OUTPATIENT_CLINIC_OR_DEPARTMENT_OTHER): Payer: Self-pay

## 2021-10-10 ENCOUNTER — Other Ambulatory Visit (HOSPITAL_BASED_OUTPATIENT_CLINIC_OR_DEPARTMENT_OTHER): Payer: Self-pay

## 2021-10-10 MED ORDER — CONTOUR NEXT TEST VI STRP
ORAL_STRIP | 3 refills | Status: DC
Start: 2021-10-10 — End: 2022-08-06
  Filled 2021-10-10: qty 300, 30d supply, fill #0
  Filled 2021-10-10: qty 200, 30d supply, fill #0

## 2021-10-10 MED ORDER — ESCITALOPRAM OXALATE 20 MG PO TABS
20.0000 mg | ORAL_TABLET | Freq: Every day | ORAL | 0 refills | Status: DC
  Filled 2021-10-10: qty 90, 90d supply, fill #0

## 2021-10-10 MED ORDER — HYDROCODONE-ACETAMINOPHEN 10-325 MG PO TABS
1.0000 | ORAL_TABLET | Freq: Four times a day (QID) | ORAL | 0 refills | Status: DC | PRN
  Filled 2021-10-10: qty 120, 30d supply, fill #0

## 2021-10-10 MED FILL — Atorvastatin Calcium Tab 80 MG (Base Equivalent): ORAL | 90 days supply | Qty: 90 | Fill #2 | Status: AC

## 2021-10-12 ENCOUNTER — Other Ambulatory Visit (HOSPITAL_BASED_OUTPATIENT_CLINIC_OR_DEPARTMENT_OTHER): Payer: Self-pay

## 2021-10-12 MED ORDER — METOPROLOL SUCCINATE ER 25 MG PO TB24
25.0000 mg | ORAL_TABLET | Freq: Every day | ORAL | 2 refills | Status: DC
  Filled 2021-10-12: qty 90, 90d supply, fill #0
  Filled 2022-02-07: qty 90, 90d supply, fill #1
  Filled 2022-05-27: qty 90, 90d supply, fill #2

## 2021-10-22 ENCOUNTER — Other Ambulatory Visit (HOSPITAL_BASED_OUTPATIENT_CLINIC_OR_DEPARTMENT_OTHER): Payer: Self-pay

## 2021-10-29 ENCOUNTER — Other Ambulatory Visit (HOSPITAL_BASED_OUTPATIENT_CLINIC_OR_DEPARTMENT_OTHER): Payer: Self-pay

## 2021-11-05 ENCOUNTER — Other Ambulatory Visit (HOSPITAL_BASED_OUTPATIENT_CLINIC_OR_DEPARTMENT_OTHER): Payer: Self-pay

## 2021-11-05 MED ORDER — MOUNJARO 2.5 MG/0.5ML ~~LOC~~ SOAJ
SUBCUTANEOUS | 6 refills | Status: DC
  Filled 2021-11-05 – 2022-01-04 (×3): qty 2, 28d supply, fill #0

## 2021-11-05 MED ORDER — MONTELUKAST SODIUM 10 MG PO TABS
ORAL_TABLET | ORAL | 3 refills | Status: DC
  Filled 2021-11-05: qty 30, 30d supply, fill #0
  Filled 2022-02-07: qty 30, 30d supply, fill #1
  Filled 2022-08-26: qty 30, 30d supply, fill #2

## 2021-11-06 ENCOUNTER — Other Ambulatory Visit (HOSPITAL_BASED_OUTPATIENT_CLINIC_OR_DEPARTMENT_OTHER): Payer: Self-pay

## 2021-11-07 ENCOUNTER — Other Ambulatory Visit (HOSPITAL_BASED_OUTPATIENT_CLINIC_OR_DEPARTMENT_OTHER): Payer: Self-pay

## 2021-11-08 ENCOUNTER — Other Ambulatory Visit (HOSPITAL_BASED_OUTPATIENT_CLINIC_OR_DEPARTMENT_OTHER): Payer: Self-pay

## 2021-11-08 MED ORDER — HYDROCODONE-ACETAMINOPHEN 10-325 MG PO TABS
ORAL_TABLET | ORAL | 0 refills | Status: DC
  Filled 2021-11-08: qty 120, 30d supply, fill #0

## 2021-11-08 MED ORDER — RAMIPRIL 10 MG PO CAPS
ORAL_CAPSULE | ORAL | 4 refills | Status: DC
  Filled 2021-11-08: qty 90, 90d supply, fill #0
  Filled 2022-02-07: qty 90, 90d supply, fill #1
  Filled 2022-05-27: qty 90, 90d supply, fill #2
  Filled 2022-08-26: qty 90, 90d supply, fill #3

## 2021-11-08 MED ORDER — ALLOPURINOL 300 MG PO TABS
ORAL_TABLET | ORAL | 3 refills | Status: DC
  Filled 2021-11-08: qty 90, 90d supply, fill #0
  Filled 2022-02-07: qty 90, 90d supply, fill #1
  Filled 2022-05-27: qty 90, 90d supply, fill #2
  Filled 2022-08-26: qty 90, 90d supply, fill #3

## 2021-11-08 MED FILL — Icosapent Ethyl Cap 1 GM: ORAL | 30 days supply | Qty: 120 | Fill #4 | Status: AC

## 2021-11-09 ENCOUNTER — Other Ambulatory Visit (HOSPITAL_BASED_OUTPATIENT_CLINIC_OR_DEPARTMENT_OTHER): Payer: Self-pay

## 2021-11-12 ENCOUNTER — Other Ambulatory Visit (HOSPITAL_BASED_OUTPATIENT_CLINIC_OR_DEPARTMENT_OTHER): Payer: Self-pay

## 2021-11-26 ENCOUNTER — Other Ambulatory Visit (HOSPITAL_BASED_OUTPATIENT_CLINIC_OR_DEPARTMENT_OTHER): Payer: Self-pay

## 2021-12-06 ENCOUNTER — Other Ambulatory Visit (HOSPITAL_BASED_OUTPATIENT_CLINIC_OR_DEPARTMENT_OTHER): Payer: Self-pay

## 2021-12-06 MED ORDER — HYDROCODONE-ACETAMINOPHEN 10-325 MG PO TABS
ORAL_TABLET | ORAL | 0 refills | Status: DC
  Filled 2021-12-07: qty 120, 30d supply, fill #0

## 2021-12-07 ENCOUNTER — Other Ambulatory Visit (HOSPITAL_BASED_OUTPATIENT_CLINIC_OR_DEPARTMENT_OTHER): Payer: Self-pay

## 2021-12-12 ENCOUNTER — Other Ambulatory Visit (HOSPITAL_BASED_OUTPATIENT_CLINIC_OR_DEPARTMENT_OTHER): Payer: Self-pay

## 2021-12-12 MED ORDER — ICOSAPENT ETHYL 1 G PO CAPS
2.0000 g | ORAL_CAPSULE | Freq: Two times a day (BID) | ORAL | 5 refills | Status: DC
  Filled 2021-12-12: qty 120, 30d supply, fill #0
  Filled 2022-02-07: qty 120, 30d supply, fill #1
  Filled 2022-04-02: qty 120, 30d supply, fill #2
  Filled 2022-05-27: qty 120, 30d supply, fill #3
  Filled 2022-07-16: qty 120, 30d supply, fill #4
  Filled 2022-08-15: qty 120, 30d supply, fill #5

## 2021-12-12 MED ORDER — FARXIGA 10 MG PO TABS
10.0000 mg | ORAL_TABLET | Freq: Every day | ORAL | 3 refills | Status: DC
  Filled 2021-12-12: qty 90, 90d supply, fill #0
  Filled 2022-04-02: qty 90, 90d supply, fill #1
  Filled 2022-05-27: qty 90, 90d supply, fill #2

## 2021-12-13 ENCOUNTER — Other Ambulatory Visit (HOSPITAL_BASED_OUTPATIENT_CLINIC_OR_DEPARTMENT_OTHER): Payer: Self-pay

## 2022-01-03 ENCOUNTER — Other Ambulatory Visit (HOSPITAL_BASED_OUTPATIENT_CLINIC_OR_DEPARTMENT_OTHER): Payer: Self-pay

## 2022-01-04 ENCOUNTER — Other Ambulatory Visit (HOSPITAL_BASED_OUTPATIENT_CLINIC_OR_DEPARTMENT_OTHER): Payer: Self-pay

## 2022-01-04 MED ORDER — HYDROCODONE-ACETAMINOPHEN 10-325 MG PO TABS
ORAL_TABLET | ORAL | 0 refills | Status: DC
  Filled 2022-01-04: qty 120, 30d supply, fill #0

## 2022-01-07 ENCOUNTER — Other Ambulatory Visit (HOSPITAL_BASED_OUTPATIENT_CLINIC_OR_DEPARTMENT_OTHER): Payer: Self-pay

## 2022-01-08 ENCOUNTER — Other Ambulatory Visit (HOSPITAL_BASED_OUTPATIENT_CLINIC_OR_DEPARTMENT_OTHER): Payer: Self-pay

## 2022-01-23 ENCOUNTER — Other Ambulatory Visit (HOSPITAL_BASED_OUTPATIENT_CLINIC_OR_DEPARTMENT_OTHER): Payer: Self-pay

## 2022-02-01 ENCOUNTER — Other Ambulatory Visit (HOSPITAL_BASED_OUTPATIENT_CLINIC_OR_DEPARTMENT_OTHER): Payer: Self-pay

## 2022-02-01 MED ORDER — HYDROCODONE-ACETAMINOPHEN 10-325 MG PO TABS
ORAL_TABLET | ORAL | 0 refills | Status: DC
  Filled 2022-02-01: qty 120, 30d supply, fill #0

## 2022-02-01 MED ORDER — MOUNJARO 2.5 MG/0.5ML ~~LOC~~ SOAJ
SUBCUTANEOUS | 3 refills | Status: DC
  Filled 2022-02-01: qty 2, 28d supply, fill #0

## 2022-02-07 ENCOUNTER — Other Ambulatory Visit (HOSPITAL_BASED_OUTPATIENT_CLINIC_OR_DEPARTMENT_OTHER): Payer: Self-pay

## 2022-02-07 MED ORDER — ESCITALOPRAM OXALATE 20 MG PO TABS
20.0000 mg | ORAL_TABLET | Freq: Every day | ORAL | 12 refills | Status: DC
  Filled 2022-02-07: qty 90, 90d supply, fill #0
  Filled 2022-05-27: qty 90, 90d supply, fill #1
  Filled 2022-08-26: qty 90, 90d supply, fill #2
  Filled 2022-12-27: qty 90, 90d supply, fill #3

## 2022-02-28 ENCOUNTER — Other Ambulatory Visit (HOSPITAL_BASED_OUTPATIENT_CLINIC_OR_DEPARTMENT_OTHER): Payer: Self-pay

## 2022-02-28 MED ORDER — MOUNJARO 5 MG/0.5ML ~~LOC~~ SOAJ
SUBCUTANEOUS | 5 refills | Status: DC
  Filled 2022-02-28: qty 2, 28d supply, fill #0

## 2022-03-04 ENCOUNTER — Other Ambulatory Visit (HOSPITAL_BASED_OUTPATIENT_CLINIC_OR_DEPARTMENT_OTHER): Payer: Self-pay

## 2022-03-04 MED ORDER — ATORVASTATIN CALCIUM 80 MG PO TABS
ORAL_TABLET | ORAL | 4 refills | Status: DC
  Filled 2022-03-04: qty 90, 90d supply, fill #0
  Filled 2022-06-04: qty 90, 90d supply, fill #1
  Filled 2022-08-26: qty 90, 90d supply, fill #2
  Filled 2022-12-27: qty 90, 90d supply, fill #3

## 2022-03-04 MED ORDER — CONTOUR NEXT TEST VI STRP
ORAL_STRIP | 3 refills | Status: DC
Start: 2022-03-04 — End: 2022-08-06
  Filled 2022-03-04: qty 200, 30d supply, fill #0

## 2022-03-04 MED ORDER — HYDROCODONE-ACETAMINOPHEN 10-325 MG PO TABS
ORAL_TABLET | ORAL | 0 refills | Status: DC
  Filled 2022-03-04: qty 120, 30d supply, fill #0

## 2022-03-11 ENCOUNTER — Other Ambulatory Visit (HOSPITAL_BASED_OUTPATIENT_CLINIC_OR_DEPARTMENT_OTHER): Payer: Self-pay

## 2022-03-18 ENCOUNTER — Other Ambulatory Visit (HOSPITAL_BASED_OUTPATIENT_CLINIC_OR_DEPARTMENT_OTHER): Payer: Self-pay

## 2022-03-18 MED ORDER — MELOXICAM 15 MG PO TABS
ORAL_TABLET | ORAL | 1 refills | Status: DC
  Filled 2022-03-18 (×2): qty 30, 30d supply, fill #0

## 2022-03-21 ENCOUNTER — Telehealth: Payer: Self-pay

## 2022-03-21 NOTE — Telephone Encounter (Signed)
   Pre-operative Risk Assessment    Patient Name: Alec [name]  DOB: 1961/07/24 MRN: [id number]      Request for Surgical Clearance    Procedure:   Left knee arthroplasty  Date of Surgery:  Clearance TBD                                 Surgeon:  Melrose Nakayama, MD Surgeon's Group or Practice Name:  [facility] Phone number:  [phone] ATTN: Marlin Canary Fax number:  [phone]   Type of Clearance Requested:   - Medical    Type of Anesthesia:  Spinal   Additional requests/questions:   None  Signed, Maija Biggers   03/21/2022, 4:57 PM

## 2022-03-22 ENCOUNTER — Telehealth: Payer: Self-pay | Admitting: *Deleted

## 2022-03-22 ENCOUNTER — Other Ambulatory Visit (HOSPITAL_BASED_OUTPATIENT_CLINIC_OR_DEPARTMENT_OTHER): Payer: Self-pay

## 2022-03-22 MED ORDER — CELECOXIB 200 MG PO CAPS
ORAL_CAPSULE | ORAL | 0 refills | Status: DC
  Filled 2022-03-22: qty 30, 30d supply, fill #0

## 2022-03-22 NOTE — Telephone Encounter (Signed)
Pt agreeable to plan of care for tele visit pre op appt 03/28/22 @ 9:20. Med rec and consent are done.

## 2022-03-22 NOTE — Telephone Encounter (Signed)
   Name: Alec [name]  DOB: [birth year]  MRN: [id number]  Primary Cardiologist: [name], MD   Preoperative team, please contact this patient and set up a phone call appointment for further preoperative risk assessment. Please obtain consent and complete medication review. Thank you for your help.  I confirm that guidance regarding antiplatelet and oral anticoagulation therapy has been completed and, if necessary, noted below.  Lenna Sciara, NP 03/22/2022, 10:11 AM [facility] [address] [place], [place] [address]

## 2022-03-22 NOTE — Telephone Encounter (Signed)
Pt agreeable to plan of care for tele visit pre op appt 03/28/22 @ 9:20. Med rec and consent are done.     Patient Consent for Virtual Visit        Alec [name] has provided verbal consent on 03/22/2022 for a virtual visit (video or telephone).   CONSENT FOR VIRTUAL VISIT FOR:  Alec [name]  By participating in this virtual visit I agree to the following:  I hereby voluntarily request, consent and authorize [facility] and its employed or contracted physicians, physician assistants, nurse practitioners or other licensed health care professionals (the Practitioner), to provide me with telemedicine health care services (the "Services") as deemed necessary by the treating Practitioner. I acknowledge and consent to receive the Services by the Practitioner via telemedicine. I understand that the telemedicine visit will involve communicating with the Practitioner through live audiovisual communication technology and the disclosure of certain medical information by electronic transmission. I acknowledge that I have been given the opportunity to request an in-person assessment or other available alternative prior to the telemedicine visit and am voluntarily participating in the telemedicine visit.  I understand that I have the right to withhold or withdraw my consent to the use of telemedicine in the course of my care at any time, without affecting my right to future care or treatment, and that the Practitioner or I may terminate the telemedicine visit at any time. I understand that I have the right to inspect all information obtained and/or recorded in the course of the telemedicine visit and may receive copies of available information for a reasonable fee.  I understand that some of the potential risks of receiving the Services via telemedicine include:  Delay or interruption in medical evaluation due to technological equipment failure or disruption; Information transmitted may not be sufficient  (e.g. poor resolution of images) to allow for appropriate medical decision making by the Practitioner; and/or  In rare instances, security protocols could fail, causing a breach of personal health information.  Furthermore, I acknowledge that it is my responsibility to provide information about my medical history, conditions and care that is complete and accurate to the best of my ability. I acknowledge that Practitioner's advice, recommendations, and/or decision may be based on factors not within their control, such as incomplete or inaccurate data provided by me or distortions of diagnostic images or specimens that may result from electronic transmissions. I understand that the practice of medicine is not an exact science and that Practitioner makes no warranties or guarantees regarding treatment outcomes. I acknowledge that a copy of this consent can be made available to me via my patient portal ([facility]), or I can request a printed copy by calling the office of [facility].    I understand that my insurance will be billed for this visit.   I have read or had this consent read to me. I understand the contents of this consent, which adequately explains the benefits and risks of the Services being provided via telemedicine.  I have been provided ample opportunity to ask questions regarding this consent and the Services and have had my questions answered to my satisfaction. I give my informed consent for the services to be provided through the use of telemedicine in my medical care

## 2022-03-25 ENCOUNTER — Other Ambulatory Visit (HOSPITAL_BASED_OUTPATIENT_CLINIC_OR_DEPARTMENT_OTHER): Payer: Self-pay

## 2022-03-28 ENCOUNTER — Encounter: Payer: Self-pay | Admitting: Physician Assistant

## 2022-03-28 ENCOUNTER — Ambulatory Visit (INDEPENDENT_AMBULATORY_CARE_PROVIDER_SITE_OTHER): Payer: PRIVATE HEALTH INSURANCE | Admitting: Physician Assistant

## 2022-03-28 ENCOUNTER — Other Ambulatory Visit (HOSPITAL_BASED_OUTPATIENT_CLINIC_OR_DEPARTMENT_OTHER): Payer: Self-pay

## 2022-03-28 DIAGNOSIS — Z01810 Encounter for preprocedural cardiovascular examination: Secondary | ICD-10-CM

## 2022-03-28 MED ORDER — MOUNJARO 7.5 MG/0.5ML ~~LOC~~ SOAJ
7.5000 mg | SUBCUTANEOUS | 6 refills | Status: DC
  Filled 2022-03-28: qty 2, 28d supply, fill #0

## 2022-03-28 NOTE — Progress Notes (Signed)
Virtual Visit via Telephone Note   Because of Murtaza Shell Gibbins's co-morbid illnesses, he is at least at moderate risk for complications without adequate follow up.  This format is felt to be most appropriate for this patient at this time.  The patient did not have access to video technology/had technical difficulties with video requiring transitioning to audio format only (telephone).  All issues noted in this document were discussed and addressed.  No physical exam could be performed with this format.  Please refer to the patient's chart for his consent to telehealth for [facility].  Evaluation Performed:  Preoperative cardiovascular risk assessment _____________   Date:  03/28/2022   Patient ID:  Menachem Urbanek Rosselli, DOB May 09, 1961, MRN [id number] Patient Location:  Home Provider location:   Office  Primary Care Provider:  Reynold Bowen, MD Primary Cardiologist:  [name], MD  Chief Complaint / Patient Profile   61 y.o. y/o male with a h/o CAD, prior unstable angina, RCA DES 2015, mixed hyperlipidemia, OSA, morbid obesity, and type 2 diabetes mellitus who is pending left knee arthroplasty ON 9/7 and presents today for telephonic preoperative cardiovascular risk assessment.   Past Medical History    Past Medical History:  Diagnosis Date   Arthritis    CAD (coronary artery disease)    a. 04/27/14 DES to [facility]; widely patent L coronary system b. cath 08/13/2015 no changes, patent RCA   Chronic lower back pain    Depression    Gout    History of echocardiogram    a. 04/27/14 with EF 65-70% and mild TR.   History of pancreatitis    Metabolic syndrome    Mixed hyperlipidemia    Obesity (BMI 30-39.9) 02/16/2016   OSA (obstructive sleep apnea) 11/26/2015   Severe with total AHI 104.8/hr and oxygen desaturations as low as 79%.  On CPAP at 13cm H2O   Type 2 diabetes mellitus ([place])    on Insulin pump   Past Surgical History:  Procedure Laterality Date   ANTERIOR LAT LUMBAR  FUSION Right 08/03/2020   Procedure: RIGHT LUMBAR THREE-LUMBAR FOUR LATERAL INTERBODY FUSION WITH INSTRUMENTATION AND ALLOGRAFT;  Surgeon: Phylliss Bob, MD;  Location: [place];  Service: Orthopedics;  Laterality: Right;  anterolateral   CARDIAC CATHETERIZATION N/A 08/14/2015   Procedure: Left Heart Cath and Coronary Angiography;  Surgeon: Belva Crome, MD;  Location: [place] CV LAB;  Service: Cardiovascular;  Laterality: N/A;   CARPAL TUNNEL RELEASE Bilateral 2006-2008   left 12/2004; right 10/2006   CHONDROPLASTY  04/20/2015   Procedure: CHONDROPLASTY;  Surgeon: Melrose Nakayama, MD;  Location: [place];  Service: Orthopedics;;   FUNCTIONAL ENDOSCOPIC SINUS SURGERY  10/2001; 08/2005;    KNEE ARTHROSCOPY Bilateral 2000's   right 08/2003;    KNEE ARTHROSCOPY WITH MEDIAL MENISECTOMY Left 04/20/2015   Procedure: LEFT ARTHROSCOPY KNEE WITH MEDIAL MENISECTOMY;  Surgeon: Melrose Nakayama, MD;  Location: [place];  Service: Orthopedics;  Laterality: Left;  Partial medial menisectomy, chondroplasty.   LEFT HEART CATHETERIZATION WITH CORONARY ANGIOGRAM N/A 04/27/2014   Procedure: LEFT HEART CATHETERIZATION WITH CORONARY ANGIOGRAM;  Surgeon: [name], MD;  Location: [facility] CATH LAB;  Service: Cardiovascular;  Laterality: N/A;   LUMBAR LAMINECTOMY Left 05/2006   PERCUTANEOUS STENT INTERVENTION  04/27/2014   Procedure: PERCUTANEOUS STENT INTERVENTION;  Surgeon: [name], MD;  Location[facility] CATH LAB;  Service: Cardiovascular;;  DES x2 (distal +prox RCA)   POSTERIOR LUMBAR FUSION  07/2009  SHOULDER ARTHROSCOPY Right ~ 2010    Allergies  Allergies  Allergen Reactions   Hydrocodone Other (See Comments)   Oxycodone-Acetaminophen Other (See Comments)   Percocet [Oxycodone-Acetaminophen] Nausea Only    Makes patient feel not with it    History of Present Illness    Isai Gottlieb Muraoka is a 61 y.o. male who presents via audio/video conferencing for a telehealth visit  today.  Pt was last seen in cardiology clinic on 04/26/2021 by Dr. Tamala Julian.  At that time Maxine Huynh Angevine was doing well .  The patient is now pending procedure as outlined above.   Since his last visit, he is doing well from a cardiovascular standpoint.  He has had no heart related symptoms.  He is able to walk 1-2 blocks on flat ground, do stairs, he does his own mowing, and vacuuming if his wife asks him to.  Because of this he is a 5.62 METS on the DASI.  This is greater than the 4 METS minimum requirement.  He does not need to hold any medications for this procedure.  Home Medications    Prior to Admission medications   Medication Sig Start Date End Date Taking? Authorizing Provider  Alirocumab (PRALUENT) 150 MG/ML SOAJ Inject 150 mg into the skin every 14 (fourteen) days. Every other Sunday    [provider]  allopurinol (ZYLOPRIM) 300 MG tablet TAKE 1 TABLET BY MOUTH ONCE DAILY Patient not taking: Reported on 03/22/2022 01/19/21 03/22/22  Reynold Bowen, MD  allopurinol (ZYLOPRIM) 300 MG tablet Take 1 tablet by mouth once daily 11/08/21     atorvastatin (LIPITOR) 80 MG tablet TAKE 1 TABLET BY MOUTH DAILY Patient not taking: Reported on 03/22/2022 11/28/20 01/08/22  Reynold Bowen, MD  atorvastatin (LIPITOR) 80 MG tablet Take 1 tablet by mouth daily 03/04/22     celecoxib (CELEBREX) 200 MG capsule Take 1 capsule by mouth once a day as needed for pain with food 03/22/22     cetirizine (ZYRTEC) 10 MG tablet Take 10 mg by mouth daily.    [provider]  dapagliflozin propanediol (FARXIGA) 10 MG TABS tablet TAKE 1 TABLET BY MOUTH ONCE DAILY 12/12/21     escitalopram (LEXAPRO) 20 MG tablet TAKE 1 TABLET (20 MG) BY MOUTH DAILY. Patient not taking: Reported on 03/22/2022 06/23/15   Belva Crome, MD  escitalopram (LEXAPRO) 20 MG tablet TAKE 1 TABLET BY MOUTH DAILY 02/07/22     Evolocumab (REPATHA SURECLICK) 675 MG/ML SOAJ See admin instructions. 09/26/20   [provider]  fenofibrate  160 MG tablet Take 1 tablet (160 mg total) by mouth daily. 07/03/21   Belva Crome, MD  glucose blood (CONTOUR NEXT TEST) test strip USE TO SELF-MONITOR BLOOD GLUCOSE UP TO 10 TIMES DAILY Patient not taking: Reported on 03/22/2022 10/10/21     glucose blood (CONTOUR NEXT TEST) test strip Use to self-monitor blood glucose up to 10 times daily 03/04/22     HYDROcodone-acetaminophen (NORCO) 10-325 MG tablet Take 1 tablet by mouth 4 times daily as needed every 6 hours as needed Patient not taking: Reported on 03/22/2022 04/12/21     HYDROcodone-acetaminophen (NORCO) 10-325 MG tablet Take 1 tablet by mouth 4 (four) times daily (every 6 hours) as needed for pain. Patient not taking: Reported on 03/22/2022 07/11/21     HYDROcodone-acetaminophen (NORCO) 10-325 MG tablet Take 1 tablet by mouth 4 times a day as needed for pain Patient not taking: Reported on 03/22/2022 09/11/21     HYDROcodone-acetaminophen (  NORCO) 10-325 MG tablet Take 1 tablet by mouth 4 (four) times daily as needed. Patient not taking: Reported on 03/22/2022 10/10/21     HYDROcodone-acetaminophen (NORCO) 10-325 MG tablet Take 1 tablet by mouth 4 times daily (every 6 hours) as needed for pain 03/04/22     icosapent Ethyl (VASCEPA) 1 g capsule TAKE 2 CAPSULES BY MOUTH TWICE DAILY 12/12/21     indomethacin (INDOCIN) 50 MG capsule Take 50 mg by mouth as needed. 10/19/13   [provider]  insulin regular human CONCENTRATED (HUMULIN R) 500 UNIT/ML injection Inject into the skin continuous. Max 2 units per hour via insulin pump, bolus as needed blood suger Patient not taking: Reported on 03/22/2022    [provider]  insulin regular human CONCENTRATED (HUMULIN R) 500 UNIT/ML injection USE IN INSULIN PUMP AS DIRECTED. USE 30 UNIT MARKINGS (150 UNITS) 3 TIMES A DAY 10/10/20 10/10/21  Reynold Bowen, MD  insulin regular human CONCENTRATED (HUMULIN R) 500 UNIT/ML injection USE IN INSULIN PUMP AS DIRECTED - USE 30 UNIT MARKINGS (150 UNITS) 3 TIMES A  DAY 04/12/21     meloxicam (MOBIC) 15 MG tablet Take 1 tablet by mouth once a day with food 03/16/22     meloxicam (MOBIC) 15 MG tablet Take 15 mg by mouth daily.    [provider]  metoprolol succinate (TOPROL-XL) 25 MG 24 hr tablet TAKE 1 TABLET BY MOUTH DAILY 10/10/20 10/10/21  Reynold Bowen, MD  metoprolol succinate (TOPROL-XL) 25 MG 24 hr tablet TAKE 1 TABLET BY MOUTH DAILY 10/12/21     montelukast (SINGULAIR) 10 MG tablet Take 1 tablet by mouth daily as needed 01/09/21     montelukast (SINGULAIR) 10 MG tablet Take 1 tablet by mouth once daily Patient not taking: Reported on 03/22/2022 11/05/21     nitroGLYCERIN (NITROSTAT) 0.4 MG SL tablet PLACE 1 TABLET (0.4 MG TOTAL) UNDER THE TONGUE EVERY 5 (FIVE) MINUTES X 3 DOSES AS NEEDED FOR CHEST PAIN. DIAL 911 AFTER 3 DOSES 04/26/21   Belva Crome, MD  ONE TOUCH ULTRA TEST test strip 1 each by Other route as needed for other (blood sugar).  08/16/15   [provider]  pregabalin (LYRICA) 50 MG capsule TAKE 1 CAPSULE BY MOUTH TWICE DAILY 12/13/20 06/11/21  Reynold Bowen, MD  pregabalin (LYRICA) 50 MG capsule TAKE 1 CAPSULE BY MOUTH TWICE DAILY 07/18/21     ramipril (ALTACE) 10 MG capsule TAKE 1 CAPSULE BY MOUTH ONCE DAILY 08/29/20 08/29/21  Reynold Bowen, MD  ramipril (ALTACE) 10 MG capsule Take 1 capsule by mouth once daily 11/08/21     sildenafil (VIAGRA) 100 MG tablet TAKE 1 TABLET BY MOUTH ONCE DAILY AS NEEDED 07/13/20 07/13/21  Reynold Bowen, MD  sildenafil (VIAGRA) 100 MG tablet TAKE 1 TABLET BY MOUTH ONCE DAILY AS NEEDED 08/15/21     tirzepatide (MOUNJARO) 2.5 MG/0.5ML Pen Inject 1 pen under the skin once a week Patient not taking: Reported on 03/22/2022 11/05/21     tirzepatide [facility]) 2.5 MG/0.5ML Pen inject once a week under the skin as directed 30 days Patient taking differently: Inject into the skin. Pt states he is now on 5 mg; pt states the dosage changes each month 02/01/22     tirzepatide [facility]) 5 MG/0.5ML Pen Inject '5mg'$   under the skin once weekly as directed 02/28/22     valACYclovir (VALTREX) 1000 MG tablet Take 1,000 mg by mouth daily as needed (cold sores).  Patient not taking: Reported on 03/22/2022 10/02/18  [provider]  valACYclovir (VALTREX) 1000 MG tablet TAKE 2 TABLETS BY MOUTH EVERY 12 HOURS FOR 24 HOURS AS NEEDED FOR OUTBREAK AS DIRECTED 05/09/21     VASCEPA 1 G CAPS Take 2 g by mouth 2 (two) times daily.  Patient not taking: Reported on 03/22/2022 03/03/14   [provider]    Physical Exam    Vital Signs:  Kodi Steil Wirtz does not have vital signs available for review today.  Given telephonic nature of communication, physical exam is limited. AAOx3. NAD. Normal affect.  Speech and respirations are unlabored.  Accessory Clinical Findings    None  Assessment & Plan    1.  Preoperative Cardiovascular Risk Assessment:  Mr. Rosner's perioperative risk of a major cardiac event is 6.6% according to the Revised Cardiac Risk Index (RCRI).  Therefore, he is at high risk for perioperative complications.   His functional capacity is good at 5.62 METs according to the [facility] Activity Status Index (DASI). Recommendations: According to ACC/AHA guidelines, no further cardiovascular testing needed.  The patient may proceed to surgery at acceptable risk.   Antiplatelet and/or Anticoagulation Recommendations: No medications need to be held for his upcoming procedure  A copy of this note will be routed to requesting surgeon.  Time:   Today, I have spent 9 minutes with the patient with telehealth technology discussing medical history, symptoms, and management plan.     Elgie Collard, PA-C  03/28/2022, 8:48 AM

## 2022-04-01 ENCOUNTER — Other Ambulatory Visit (HOSPITAL_BASED_OUTPATIENT_CLINIC_OR_DEPARTMENT_OTHER): Payer: Self-pay

## 2022-04-01 MED ORDER — HYDROCODONE-ACETAMINOPHEN 10-325 MG PO TABS
ORAL_TABLET | ORAL | 0 refills | Status: DC
  Filled 2022-04-01: qty 120, 30d supply, fill #0

## 2022-04-02 ENCOUNTER — Other Ambulatory Visit (HOSPITAL_BASED_OUTPATIENT_CLINIC_OR_DEPARTMENT_OTHER): Payer: Self-pay

## 2022-04-02 MED ORDER — HUMULIN R U-500 (CONCENTRATED) 500 UNIT/ML ~~LOC~~ SOLN
SUBCUTANEOUS | 3 refills | Status: DC
  Filled 2022-04-02: qty 20, 23d supply, fill #0
  Filled 2022-05-13: qty 20, 23d supply, fill #1
  Filled 2022-06-04: qty 20, 23d supply, fill #2
  Filled 2022-07-16: qty 20, 23d supply, fill #3
  Filled 2022-08-05: qty 20, 23d supply, fill #4
  Filled 2022-08-26 (×2): qty 20, 23d supply, fill #5
  Filled 2022-10-03: qty 20, 23d supply, fill #6
  Filled 2022-11-01: qty 20, 23d supply, fill #7
  Filled 2022-12-27: qty 20, 23d supply, fill #8
  Filled 2023-01-17: qty 20, 23d supply, fill #9
  Filled 2023-03-25: qty 20, 23d supply, fill #10

## 2022-04-04 ENCOUNTER — Other Ambulatory Visit (HOSPITAL_BASED_OUTPATIENT_CLINIC_OR_DEPARTMENT_OTHER): Payer: Self-pay

## 2022-04-23 ENCOUNTER — Other Ambulatory Visit (HOSPITAL_BASED_OUTPATIENT_CLINIC_OR_DEPARTMENT_OTHER): Payer: Self-pay

## 2022-04-23 MED ORDER — VALACYCLOVIR HCL 1 G PO TABS
ORAL_TABLET | ORAL | 1 refills | Status: DC
Start: 2022-04-23 — End: 2023-06-02
  Filled 2022-04-23: qty 30, 15d supply, fill #0
  Filled 2022-10-09: qty 30, 15d supply, fill #1

## 2022-04-23 MED ORDER — CELECOXIB 200 MG PO CAPS
200.0000 mg | ORAL_CAPSULE | Freq: Every day | ORAL | 0 refills | Status: DC | PRN
  Filled 2022-04-23: qty 30, 30d supply, fill #0

## 2022-04-25 ENCOUNTER — Other Ambulatory Visit (HOSPITAL_BASED_OUTPATIENT_CLINIC_OR_DEPARTMENT_OTHER): Payer: Self-pay

## 2022-04-26 ENCOUNTER — Other Ambulatory Visit (HOSPITAL_BASED_OUTPATIENT_CLINIC_OR_DEPARTMENT_OTHER): Payer: Self-pay

## 2022-04-29 ENCOUNTER — Other Ambulatory Visit (HOSPITAL_BASED_OUTPATIENT_CLINIC_OR_DEPARTMENT_OTHER): Payer: Self-pay

## 2022-04-29 MED ORDER — MOUNJARO 10 MG/0.5ML ~~LOC~~ SOAJ
SUBCUTANEOUS | 6 refills | Status: DC
  Filled 2022-04-29: qty 2, 28d supply, fill #0

## 2022-05-02 ENCOUNTER — Other Ambulatory Visit (HOSPITAL_BASED_OUTPATIENT_CLINIC_OR_DEPARTMENT_OTHER): Payer: Self-pay

## 2022-05-02 MED ORDER — HYDROCODONE-ACETAMINOPHEN 10-325 MG PO TABS
ORAL_TABLET | ORAL | 0 refills | Status: DC
Start: 2022-05-02 — End: 2022-05-28
  Filled 2022-05-02: qty 120, 30d supply, fill #0

## 2022-05-13 ENCOUNTER — Other Ambulatory Visit: Payer: Self-pay | Admitting: Orthopaedic Surgery

## 2022-05-13 ENCOUNTER — Other Ambulatory Visit (HOSPITAL_BASED_OUTPATIENT_CLINIC_OR_DEPARTMENT_OTHER): Payer: Self-pay

## 2022-05-13 MED ORDER — ACCU-CHEK GUIDE VI STRP
ORAL_STRIP | 3 refills | Status: DC
  Filled 2022-05-13: qty 300, 30d supply, fill #0

## 2022-05-14 NOTE — Progress Notes (Signed)
Sent message, via epic in basket, requesting orders in epic from surgeon.  

## 2022-05-16 NOTE — Progress Notes (Addendum)
COVID Vaccine received:  '[]'$  No '[x]'$  Yes Date of any COVID positive Test in last 90 days: None  PCP - Reynold Bowen, MD Cardiologist - Daneen Schick, MD     Clearance by Nicholes Rough PA-C  03-28-22 Epic note  Chest x-ray - n/a EKG - ordered for PST appt   Stress Test - 09-04-2018 Epic ECHO - 08-12-2015  Epic Cardiac Cath - 2015 & 2016  DES  by Dr. Daneen Schick  Pacemaker/ICD device     '[x]'$  N/A Spinal Cord Stimulator:'[x]'$  No '[]'$  Yes     Other Implants:  INSULIN PUMP  History of Sleep Apnea? '[]'$  No '[x]'$  Yes   Sleep Study Date:  2017 CPAP used?- '[]'$  No '[x]'$  Yes  (Instruct to bring their mask & Tubing)  Does the patient monitor blood sugar? '[]'$  No '[x]'$  Yes  '[]'$  N/A Does patient have a [employer] or Dexacom? '[]'$  No '[x]'$  Yes  Guardian Link glucose Monitor  Fasting Blood Sugar Ranges- 80-120 Checks Blood Sugar ___2  times a day  Blood Thinner Instructions:None Aspirin Instructions:ASA 81 mg  patient to keep taking Last Dose:  ERAS Protocol Ordered: '[]'$  No  '[]'$  Yes   No orders from Surgeon  as of PST appt PRE-SURGERY '[]'$  ENSURE  '[]'$  G2   Comments: PATIENT HAS INSULIN PUMP    The DM coordinator has been called and patient has signed Insulin Pump Contract.   Activity level: Patient can climb a flight of stairs without difficulty; '[x]'$  No CP  '[x]'$  No SOB,  but would have _knee pain_____   Anesthesia review: CAD_DES x 2, OSA(CPAP), unstable angina  Patient denies shortness of breath, fever, cough and chest pain at PAT appointment.  Patient verbalized understanding and agreement to the Pre-Surgical Instructions that were given to them at this PAT appointment. Patient was also educated of the need to review these PAT instructions again prior to his/her surgery.I reviewed the appropriate phone numbers to call if they have any and questions or concerns.

## 2022-05-16 NOTE — Patient Instructions (Addendum)
DUE TO SPACE LIMITATIONS, ONLY TWO VISITORS  (aged 60 and older) ARE ALLOWED TO COME WITH YOU AND STAY IN THE WAITING ROOM DURING YOUR PRE OP AND PROCEDURE.   **NO VISITORS ARE ALLOWED IN THE SHORT STAY AREA OR RECOVERY ROOM!!**  IF YOU WILL BE ADMITTED INTO THE HOSPITAL YOU ARE ALLOWED ONLY FOUR SUPPORT PEOPLE DURING VISITATION HOURS (7 AM -8PM)   The support person(s) must pass our screening, and use Hand sanitizing gel. Visitors GUEST BADGE MUST BE WORN VISIBLY  One adult visitor may remain with you overnight and MUST be in the room by 8 P.M.   You are not required to quarantine at this time prior to your surgery. However, you must do this: Hand Hygiene often Do NOT share personal items Notify your provider if you are in close contact with someone who has COVID or you develop fever 100.4 or greater, new onset of sneezing, cough, sore throat, shortness of breath or body aches.       Your procedure is scheduled on:    Report to [facility] Main Entrance.  Report to admitting at:     AM  +++++Call this number if you have any questions or problems the morning of surgery [phone]  Do not eat food :After Midnight the night prior to your surgery/procedure.  After Midnight you may have the following liquids until         AM / PM DAY OF SURGERY  Clear Liquid Diet Water Black Coffee (sugar ok, NO MILK/CREAM OR CREAMERS)  Tea (sugar ok, NO MILK/CREAM OR CREAMERS) regular and decaf                             Plain Jell-O (NO RED)                                           Fruit ices (not with fruit pulp, NO RED)                                     Popsicles (NO RED)                                                                  Juice: apple, WHITE grape, WHITE cranberry Sports drinks like Gatorade (NO RED)              FOLLOW ANY ADDITIONAL PRE OP INSTRUCTIONS YOU RECEIVED FROM YOUR SURGEON'S OFFICE!!!   Oral Hygiene is also important to reduce your risk of infection.         Remember - BRUSH YOUR TEETH THE MORNING OF SURGERY WITH YOUR REGULAR TOOTHPASTE  Do NOT smoke after Midnight the night before surgery.  How to Manage Your Diabetes Before and After Surgery  Why is it important to control my blood sugar before and after surgery? Improving blood sugar levels before and after surgery helps healing and can limit problems. A way of improving blood sugar control is eating a healthy diet by:  Eating less sugar and carbohydrates  Increasing activity/exercise  Talking with your doctor about reaching your blood sugar goals High blood sugars (greater than 180 mg/dL) can raise your risk of infections and slow your recovery, so you will need to focus on controlling your diabetes during the weeks before surgery. Make sure that the doctor who takes care of your diabetes knows about your planned surgery including the date and location.  How do I manage my blood sugar before surgery? Check your blood sugar at least 4 times a day, starting 2 days before surgery, to make sure that the level is not too high or low. Check your blood sugar the morning of your surgery when you wake up and every 2 hours until you get to the Short Stay unit. If your blood sugar is less than 70 mg/dL, you will need to treat for low blood sugar: Do not take insulin. Treat a low blood sugar (less than 70 mg/dL) with  cup of clear juice (cranberry or apple), 4 glucose tablets, OR glucose gel. Recheck blood sugar in 15 minutes after treatment (to make sure it is greater than 70 mg/dL). If your blood sugar is not greater than 70 mg/dL on recheck, call [phone] for further instructions. Report your blood sugar to the short stay nurse when you get to Short Stay.  If you are admitted to the hospital after surgery: Your blood sugar will be checked by the staff and you will probably be given insulin after surgery (instead of oral diabetes medicines) to make sure you have good blood sugar levels. The  goal for blood sugar control after surgery is 80-180 mg/dL.   WHAT DO I DO ABOUT MY DIABETES MEDICATION?     Mounjaro Injection- Do not inject the week of your surgery. Resume injections the following week on your normal dosing day.      Wilder Glade-  Do not take Wilder Glade the day before your surgery or the morning of your surgery.          Humulin R (Insulin Pump)  Patients with Insulin Pumps    For patients with Insulin Pumps: Contact your diabetes doctor for specific instructions before surgery. Decrease basal insulin rates by 20% at midnight the night before surgery. Do not remove your insulin pump prior to arrival to short stay the morning of surgery.  Anesthesia will instruct you on when to remove your pump. Note that if your surgery is planned to be longer than 2 hours, your insulin pump will be removed and intravenous (IV) insulin will be started and managed by the nurses and anesthesiologist. You will be able to restart your insulin pump once you are awake and able to manage it. Make sure to bring insulin pump supplies to the hospital with you in case your site needs to be changed.   If your CBG is greater than 220 mg/dL, you may take  of your sliding scale  (correction) dose of insulin.    IF you have any questions, call the nurse at [phone]      Take ONLY these medicines the morning of surgery with A SIP OF WATER: Metoprolol, Allopurinol, Pregabalin (Lyrica), escitalopram (Lexapro), fenofibrate. If needed you may take Hydrocodone and montelukast (Singulair).    Bring CPAP mask and tubing day of surgery.                   You may not have any metal on your body including  jewelry, and body piercing  Do not wear  lotions, powders, cologne,  or deodorant  Men may shave face and neck.  Contacts, Hearing Aids, dentures or bridgework may not be worn into surgery.   DO NOT [place]. PHARMACY WILL DISPENSE MEDICATIONS LISTED ON YOUR  MEDICATION LIST TO YOU DURING YOUR ADMISSION [place]!   Patients discharged on the day of surgery will not be allowed to drive home.  Someone NEEDS to stay with you for the first 24 hours after anesthesia.  Special Instructions: Bring a copy of your healthcare power of attorney and living will documents the day of surgery, if you wish to have them scanned into your [facility] Records- EPIC  Please read over the following fact sheets you were given: IF YOU HAVE QUESTIONS ABOUT YOUR PRE-OP INSTRUCTIONS, PLEASE CALL [phone]  ([place])   [place] - Preparing for Surgery Before surgery, you can play an important role.  Because skin is not sterile, your skin needs to be as free of germs as possible.  You can reduce the number of germs on your skin by washing with CHG (chlorahexidine gluconate) soap before surgery.  CHG is an antiseptic cleaner which kills germs and bonds with the skin to continue killing germs even after washing. Please DO NOT use if you have an allergy to CHG or antibacterial soaps.  If your skin becomes reddened/irritated stop using the CHG and inform your nurse when you arrive at Short Stay. Do not shave (including legs and underarms) for at least 48 hours prior to the first CHG shower.  You may shave your face/neck.  Please follow these instructions carefully:  1.  Shower with CHG Soap the night before surgery and the  morning of surgery.  2.  If you choose to wash your hair, wash your hair first as usual with your normal  shampoo.  3.  After you shampoo, rinse your hair and body thoroughly to remove the shampoo.                             4.  Use CHG as you would any other liquid soap.  You can apply chg directly to the skin and wash.  Gently with a scrungie or clean washcloth.  5.  Apply the CHG Soap to your body ONLY FROM THE NECK DOWN.   Do not use on face/ open                           Wound or open sores. Avoid contact with eyes, ears mouth and genitals  (private parts).                       Wash face,  Genitals (private parts) with your normal soap.             6.  Wash thoroughly, paying special attention to the area where your  surgery  will be performed.  7.  Thoroughly rinse your body with warm water from the neck down.  8.  DO NOT shower/wash with your normal soap after using and rinsing off the CHG Soap.            9.  Pat yourself dry with a clean towel.            10.  Wear clean pajamas.            11.  Place clean sheets on your  bed the night of your first shower and do not  sleep with pets.  ON THE DAY OF SURGERY : Do not apply any lotions/deodorants the morning of surgery.  Please wear clean clothes to the hospital/surgery center.    FAILURE TO FOLLOW THESE INSTRUCTIONS MAY RESULT IN THE CANCELLATION OF YOUR SURGERY  PATIENT SIGNATURE_________________________________  NURSE SIGNATURE__________________________________  ________________________________________________________________________

## 2022-05-21 ENCOUNTER — Other Ambulatory Visit: Payer: Self-pay

## 2022-05-21 ENCOUNTER — Encounter (HOSPITAL_COMMUNITY): Payer: Self-pay

## 2022-05-21 ENCOUNTER — Encounter (HOSPITAL_COMMUNITY)
Admission: RE | Admit: 2022-05-21 | Discharge: 2022-05-21 | Disposition: A | Discharge status: Home / Self Care | Payer: PRIVATE HEALTH INSURANCE | Source: Ambulatory Visit | Attending: Orthopaedic Surgery | Admitting: Orthopaedic Surgery

## 2022-05-21 DIAGNOSIS — Z01818 Encounter for other preprocedural examination: Secondary | ICD-10-CM | POA: Diagnosis present

## 2022-05-21 DIAGNOSIS — M1712 Unilateral primary osteoarthritis, left knee: Secondary | ICD-10-CM | POA: Diagnosis not present

## 2022-05-21 DIAGNOSIS — E119 Type 2 diabetes mellitus without complications: Secondary | ICD-10-CM | POA: Insufficient documentation

## 2022-05-21 DIAGNOSIS — I10 Essential (primary) hypertension: Secondary | ICD-10-CM | POA: Diagnosis not present

## 2022-05-21 DIAGNOSIS — Z794 Long term (current) use of insulin: Secondary | ICD-10-CM | POA: Insufficient documentation

## 2022-05-21 DIAGNOSIS — G4733 Obstructive sleep apnea (adult) (pediatric): Secondary | ICD-10-CM | POA: Insufficient documentation

## 2022-05-21 DIAGNOSIS — I2510 Atherosclerotic heart disease of native coronary artery without angina pectoris: Secondary | ICD-10-CM | POA: Insufficient documentation

## 2022-05-21 LAB — CBC
HCT: 49.4 % (ref 39.0–52.0)
Hemoglobin: 16.5 g/dL (ref 13.0–17.0)
MCH: 30.7 pg (ref 26.0–34.0)
MCHC: 33.4 g/dL (ref 30.0–36.0)
MCV: 91.8 fL (ref 80.0–100.0)
Platelets: 221 10*3/uL (ref 150–400)
RBC: 5.38 MIL/uL (ref 4.22–5.81)
RDW: 13.4 % (ref 11.5–15.5)
WBC: 8 10*3/uL (ref 4.0–10.5)
nRBC: 0 % (ref 0.0–0.2)

## 2022-05-21 LAB — BASIC METABOLIC PANEL
Anion gap: 8 (ref 5–15)
BUN: 19 mg/dL (ref 6–20)
CO2: 23 mmol/L (ref 22–32)
Calcium: 8.9 mg/dL (ref 8.9–10.3)
Chloride: 109 mmol/L (ref 98–111)
Creatinine, Ser: 1.08 mg/dL (ref 0.61–1.24)
GFR, Estimated: >60 mL/min (ref >60)
Glucose, Bld: 93 mg/dL (ref 70–99)
Potassium: 4 mmol/L (ref 3.5–5.1)
Sodium: 140 mmol/L (ref 135–145)

## 2022-05-21 LAB — GLUCOSE, CAPILLARY: Glucose-Capillary: 114 mg/dL — ABNORMAL HIGH (ref 70–99)

## 2022-05-21 LAB — HEMOGLOBIN A1C
Hgb A1c MFr Bld: 6.1 % — ABNORMAL HIGH (ref 4.8–5.6)
Mean Plasma Glucose: 128.37 mg/dL

## 2022-05-21 LAB — SURGICAL PCR SCREEN
MRSA, PCR: NEGATIVE
Staphylococcus aureus: POSITIVE — AB

## 2022-05-21 NOTE — Progress Notes (Signed)
Patient's PCR screen is positive for [facility]. Appropriate notes have been placed on the patient's chart. This note has been routed to Dr.  Rhona Raider for review. The Patient's surgery is currently Scheduled for:05-28-22  Leota Jacobsen, BSN, CVRN-BC   Pre-Surgical Testing Nurse [place]  [phone]

## 2022-05-22 NOTE — Progress Notes (Signed)
Anesthesia Chart Review   Case: [id number] Date/Time: 05/28/22 1045   Procedure: LEFT TOTAL KNEE ARTHROPLASTY (Left: Knee)   Anesthesia type: Spinal   Pre-op diagnosis: LEFT KNEE DEGENERATIVE JOINT DISEASE   Location: [facility] ROOM 06 / [facility]   Surgeons: Melrose Nakayama, MD       DISCUSSION:61 y.o. never smoker with h/o HTN, DM II insulin pump in place, OSA, CAD (DES 2015), left knee djd scheduled for above procedure 05/28/2022 with Dr. Melrose Nakayama.   Pt last seen by cardiology 03/28/2022. Per OV note, "Alec [name]'s perioperative risk of a major cardiac event is 6.6% according to the Revised Cardiac Risk Index (RCRI).  Therefore, he is at high risk for perioperative complications.   His functional capacity is good at 5.62 METs according to the Duke Activity Status Index (DASI). Recommendations: According to ACC/AHA guidelines, no further cardiovascular testing needed.  The patient may proceed to surgery at acceptable risk.   Antiplatelet and/or Anticoagulation Recommendations: No medications need to be held for his upcoming procedure"  Anticipate pt can proceed with planned procedure barring acute status change.   VS: BP (!) 141/88 Comment: right arm sitting, no meds yet this morning  Pulse 68   Temp 36.6 C (Oral)   Resp 16   Ht 5' 10.5" (1.791 m)   Wt 122.9 kg   SpO2 96%   BMI 38.33 kg/m   PROVIDERS: Reynold Bowen, MD is PCP   Cardiologist - Daneen Schick, MD LABS: Labs reviewed: Acceptable for surgery. (all labs ordered are listed, but only abnormal results are displayed)  Labs Reviewed  SURGICAL PCR SCREEN - Abnormal; Notable for the following components:      Result Value   Staphylococcus aureus POSITIVE (*)    All other components within normal limits  HEMOGLOBIN A1C - Abnormal; Notable for the following components:   Hgb A1c MFr Bld 6.1 (*)    All other components within normal limits  GLUCOSE, CAPILLARY - Abnormal; Notable for the following components:    Glucose-Capillary 114 (*)    All other components within normal limits  BASIC METABOLIC PANEL  CBC     IMAGES:   EKG:   CV: Myocardial Perfusion 09/04/2018 Nuclear stress EF: 56%. Clinically and electrically negative for ischemia Normal perfusion No ischemia or scar Low risk study.   Echo 08/12/2015 Study Conclusions   - Left ventricle: The cavity size was normal. Wall thickness was    increased in a pattern of mild LVH. Systolic function was normal.    The estimated ejection fraction was in the range of 60% to 65%.    Wall motion was normal; there were no regional wall motion    abnormalities. Doppler parameters are consistent with abnormal    left ventricular relaxation (grade 1 diastolic dysfunction).  - Aortic valve: Mildly calcified annulus. Trileaflet; mildly    thickened leaflets. Valve area (VTI): 3.78 cm^2. Valve area    (Vmax): 3.61 cm^2.  - Technically difficult study. Echocontrast was used to enhance    visualization.   Cardiac Cath 08/14/2015 Mild luminal irregularities are noted in the right coronary, and circumflex. Widely patent proximal and distal RCA stents placed in August 2015. Normal LV function with elevated end-diastolic pressure. Prolonged chest discomfort, greater than 24 hours without EKG changes or enzyme elevation suggests alternative explanation than ischemia/infarction.     RECOMMENDATIONS:   Consider alternative explanations for chest discomfort. It is conceivable that the patient's initial presentation in August 2015 was noncardiac. The right coronary  was stented after FFR in the setting of chest pain that was interpreted as angina. Past Medical History:  Diagnosis Date   Arthritis    CAD (coronary artery disease)    a. 04/27/14 DES to [facility]; widely patent L coronary system b. cath 08/13/2015 no changes, patent RCA   Chronic lower back pain    Depression    Gout    History of echocardiogram    a. 04/27/14 with EF 65-70% and mild TR.    History of pancreatitis    Hypertension    Metabolic syndrome    Mixed hyperlipidemia    Obesity (BMI 30-39.9) 02/16/2016   OSA (obstructive sleep apnea) 11/26/2015   Severe with total AHI 104.8/hr and oxygen desaturations as low as 79%.  On CPAP at 13cm H2O   Type 2 diabetes mellitus ([place])    on Insulin pump    Past Surgical History:  Procedure Laterality Date   ANTERIOR LAT LUMBAR FUSION Right 08/03/2020   Procedure: RIGHT LUMBAR THREE-LUMBAR FOUR LATERAL INTERBODY FUSION WITH INSTRUMENTATION AND ALLOGRAFT;  Surgeon: Phylliss Bob, MD;  Location: [place];  Service: Orthopedics;  Laterality: Right;  anterolateral   CARDIAC CATHETERIZATION N/A 08/14/2015   Procedure: Left Heart Cath and Coronary Angiography;  Surgeon: Belva Crome, MD;  Location: [place] CV LAB;  Service: Cardiovascular;  Laterality: N/A;   CARPAL TUNNEL RELEASE Bilateral 2006-2008   left 12/2004; right 10/2006   CHONDROPLASTY  04/20/2015   Procedure: CHONDROPLASTY;  Surgeon: Melrose Nakayama, MD;  Location: [place];  Service: Orthopedics;;   FUNCTIONAL ENDOSCOPIC SINUS SURGERY  10/2001; 08/2005;    KNEE ARTHROSCOPY Bilateral 2000's   right 08/2003;    KNEE ARTHROSCOPY WITH MEDIAL MENISECTOMY Left 04/20/2015   Procedure: LEFT ARTHROSCOPY KNEE WITH MEDIAL MENISECTOMY;  Surgeon: Melrose Nakayama, MD;  Location: [place];  Service: Orthopedics;  Laterality: Left;  Partial medial menisectomy, chondroplasty.   LEFT HEART CATHETERIZATION WITH CORONARY ANGIOGRAM N/A 04/27/2014   Procedure: LEFT HEART CATHETERIZATION WITH CORONARY ANGIOGRAM;  Surgeon: [name], MD;  Location: [facility] CATH LAB;  Service: Cardiovascular;  Laterality: N/A;   LUMBAR LAMINECTOMY Left 05/2006   PERCUTANEOUS STENT INTERVENTION  04/27/2014   Procedure: PERCUTANEOUS STENT INTERVENTION;  Surgeon: [name], MD;  Location: [facility] CATH LAB;  Service: Cardiovascular;;  DES x2 (distal +prox RCA)   POSTERIOR LUMBAR FUSION   07/2009   SHOULDER ARTHROSCOPY Right ~ 2010    MEDICATIONS:  allopurinol (ZYLOPRIM) 300 MG tablet   aspirin EC 81 MG tablet   atorvastatin (LIPITOR) 80 MG tablet   celecoxib (CELEBREX) 200 MG capsule   cetirizine (ZYRTEC) 10 MG tablet   dapagliflozin propanediol (FARXIGA) 10 MG TABS tablet   escitalopram (LEXAPRO) 20 MG tablet   Evolocumab (REPATHA SURECLICK) 850 MG/ML SOAJ   fenofibrate 160 MG tablet   glucose blood (ACCU-CHEK GUIDE) test strip   glucose blood (CONTOUR NEXT TEST) test strip   glucose blood (CONTOUR NEXT TEST) test strip   HYDROcodone-acetaminophen (NORCO) 10-325 MG tablet   icosapent Ethyl (VASCEPA) 1 g capsule   insulin regular human CONCENTRATED (HUMULIN R) 500 UNIT/ML injection   Lysine 500 MG TABS   meloxicam (MOBIC) 15 MG tablet   metoprolol succinate (TOPROL-XL) 25 MG 24 hr tablet   montelukast (SINGULAIR) 10 MG tablet   montelukast (SINGULAIR) 10 MG tablet   nitroGLYCERIN (NITROSTAT) 0.4 MG SL tablet   ONE TOUCH ULTRA TEST test strip   pregabalin (LYRICA) 150 MG capsule  pregabalin (LYRICA) 50 MG capsule   ramipril (ALTACE) 10 MG capsule   sildenafil (VIAGRA) 100 MG tablet   tirzepatide (MOUNJARO) 10 MG/0.5ML Pen   tirzepatide (MOUNJARO) 7.5 MG/0.5ML Pen   valACYclovir (VALTREX) 1000 MG tablet   No current facility-administered medications for this encounter.    Alec Felix Ward, PA-C [facility] [phone]

## 2022-05-22 NOTE — Anesthesia Preprocedure Evaluation (Addendum)
Anesthesia Evaluation  Patient identified by MRN, date of birth, ID band Patient awake    Reviewed: Allergy & Precautions, NPO status , Patient's Chart, lab work & pertinent test results  Airway Mallampati: III  TM Distance: >3 FB Neck ROM: Full    Dental no notable dental hx.    Pulmonary sleep apnea and Continuous Positive Airway Pressure Ventilation ,    Pulmonary exam normal        Cardiovascular hypertension, Pt. on home beta blockers and Pt. on medications + CAD and + Cardiac Stents  Normal cardiovascular exam     Neuro/Psych PSYCHIATRIC DISORDERS Depression negative neurological ROS     GI/Hepatic negative GI ROS, Neg liver ROS,   Endo/Other  diabetes, Insulin Dependent, Oral Hypoglycemic Agents  Renal/GU negative Renal ROS     Musculoskeletal  (+) Arthritis , Gout   Abdominal (+) + obese,   Peds  Hematology negative hematology ROS (+)   Anesthesia Other Findings LEFT KNEE DEGENERATIVE JOINT DISEASE  Reproductive/Obstetrics                          Anesthesia Physical Anesthesia Plan  ASA: 3  Anesthesia Plan: General and Regional   Post-op Pain Management:    Induction: Intravenous  PONV Risk Score and Plan: 2 and Ondansetron, Dexamethasone, Midazolam and Treatment may vary due to age or medical condition  Airway Management Planned: Oral ETT  Additional Equipment:   Intra-op Plan:   Post-operative Plan: Extubation in OR  Informed Consent: I have reviewed the patients History and Physical, chart, labs and discussed the procedure including the risks, benefits and alternatives for the proposed anesthesia with the patient or authorized representative who has indicated his/her understanding and acceptance.     Dental advisory given  Plan Discussed with: CRNA  Anesthesia Plan Comments: (See PAT note 05/21/2022)       Anesthesia Quick Evaluation

## 2022-05-24 ENCOUNTER — Other Ambulatory Visit (HOSPITAL_BASED_OUTPATIENT_CLINIC_OR_DEPARTMENT_OTHER): Payer: Self-pay

## 2022-05-24 MED ORDER — MOUNJARO 15 MG/0.5ML ~~LOC~~ SOAJ
15.0000 mg | SUBCUTANEOUS | 6 refills | Status: DC
Start: 2022-05-24 — End: 2022-12-04
  Filled 2022-05-24: qty 2, 28d supply, fill #0
  Filled 2022-06-19: qty 2, 28d supply, fill #1
  Filled 2022-07-16: qty 2, 28d supply, fill #2
  Filled 2022-08-15: qty 2, 28d supply, fill #3
  Filled 2022-09-17: qty 2, 28d supply, fill #4
  Filled 2022-10-11: qty 2, 28d supply, fill #5
  Filled 2022-11-08: qty 2, 28d supply, fill #6

## 2022-05-27 ENCOUNTER — Other Ambulatory Visit (HOSPITAL_BASED_OUTPATIENT_CLINIC_OR_DEPARTMENT_OTHER): Payer: Self-pay

## 2022-05-27 MED ORDER — CELECOXIB 200 MG PO CAPS
ORAL_CAPSULE | ORAL | 0 refills | Status: DC
  Filled 2022-05-27: qty 30, 30d supply, fill #0

## 2022-05-27 NOTE — H&P (Signed)
TOTAL KNEE ADMISSION H&P  Patient is being admitted for left total knee arthroplasty.  Subjective:  Chief Complaint:left knee pain.  HPI: Alec [name], 61 y.o. male, has a history of pain and functional disability in the left knee due to arthritis and has failed non-surgical conservative treatments for greater than 12 weeks to includeNSAID's and/or analgesics, corticosteriod injections, flexibility and strengthening excercises, use of assistive devices, weight reduction as appropriate, and activity modification.  Onset of symptoms was gradual, starting 5 years ago with gradually worsening course since that time. The patient noted no past surgery on the left knee(s).  Patient currently rates pain in the left knee(s) at 10 out of 10 with activity. Patient has night pain, worsening of pain with activity and weight bearing, pain that interferes with activities of daily living, crepitus, and joint swelling.  Patient has evidence of subchondral cysts, subchondral sclerosis, periarticular osteophytes, and joint space narrowing by imaging studies. There is no active infection.  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[place]) 08/03/2020   Shortness of breath 10/04/2019   Sinusitis, acute maxillary 09/30/2019   COVID-19 virus detected 09/30/2019   Epistaxis 06/24/2016   On anticoagulant therapy 06/24/2016   Obesity (BMI 30-39.9) 02/16/2016   OSA (obstructive sleep apnea) 11/26/2015   Controlled type 2 diabetes mellitus ([place])    Acute chest pain 08/12/2015   Hypertriglyceridemia 04/28/2014   CAD (coronary artery disease)    Metabolic syndrome    History of echocardiogram    Depression    Gout    Unstable angina ([place]) 04/26/2014   Type 2 diabetes mellitus ([place]) 04/26/2014   Mixed hyperlipidemia 04/26/2014   Past Medical History:  Diagnosis Date   Arthritis    CAD (coronary artery disease)    a. 04/27/14 DES to dRCA; widely patent L coronary system b. cath  08/13/2015 no changes, patent RCA   Chronic lower back pain    Depression    Gout    History of echocardiogram    a. 04/27/14 with EF 65-70% and mild TR.   History of pancreatitis    Hypertension    Metabolic syndrome    Mixed hyperlipidemia    Obesity (BMI 30-39.9) 02/16/2016   OSA (obstructive sleep apnea) 11/26/2015   Severe with total AHI 104.8/hr and oxygen desaturations as low as 79%.  On CPAP at 13cm H2O   Type 2 diabetes mellitus ([place])    on Insulin pump    Past Surgical History:  Procedure Laterality Date   ANTERIOR LAT LUMBAR FUSION Right 08/03/2020   Procedure: RIGHT LUMBAR THREE-LUMBAR FOUR LATERAL INTERBODY FUSION WITH INSTRUMENTATION AND ALLOGRAFT;  Surgeon: Phylliss Bob, MD;  Location: [place];  Service: Orthopedics;  Laterality: Right;  anterolateral   CARDIAC CATHETERIZATION N/A 08/14/2015   Procedure: Left Heart Cath and Coronary Angiography;  Surgeon: Belva Crome, MD;  Location: [place] CV LAB;  Service: Cardiovascular;  Laterality: N/A;   CARPAL TUNNEL RELEASE Bilateral 2006-2008   left 12/2004; right 10/2006   CHONDROPLASTY  04/20/2015   Procedure: CHONDROPLASTY;  Surgeon: Melrose Nakayama, MD;  Location: [place];  Service: Orthopedics;;   FUNCTIONAL ENDOSCOPIC SINUS SURGERY  10/2001; 08/2005;    KNEE ARTHROSCOPY Bilateral 2000's   right 08/2003;    KNEE ARTHROSCOPY WITH MEDIAL MENISECTOMY Left 04/20/2015   Procedure: LEFT ARTHROSCOPY KNEE WITH MEDIAL MENISECTOMY;  Surgeon: Melrose Nakayama, MD;  Location: [place];  Service: Orthopedics;  Laterality: Left;  Partial medial  menisectomy, chondroplasty.   LEFT HEART CATHETERIZATION WITH CORONARY ANGIOGRAM N/A 04/27/2014   Procedure: LEFT HEART CATHETERIZATION WITH CORONARY ANGIOGRAM;  Surgeon: [name], MD;  Location: [facility] CATH LAB;  Service: Cardiovascular;  Laterality: N/A;   LUMBAR LAMINECTOMY Left 05/2006   PERCUTANEOUS STENT INTERVENTION  04/27/2014   Procedure: PERCUTANEOUS  STENT INTERVENTION;  Surgeon: [name], MD;  Location: [facility] CATH LAB;  Service: Cardiovascular;;  DES x2 (distal +prox RCA)   POSTERIOR LUMBAR FUSION  07/2009   SHOULDER ARTHROSCOPY Right ~ 2010    No current facility-administered medications for this encounter.   Current Outpatient Medications  Medication Sig Dispense Refill Last Dose   allopurinol (ZYLOPRIM) 300 MG tablet Take 1 tablet by mouth once daily 90 tablet 3    aspirin EC 81 MG tablet Take 81 mg by mouth daily. Swallow whole.      atorvastatin (LIPITOR) 80 MG tablet Take 1 tablet by mouth daily 90 tablet 4    celecoxib (CELEBREX) 200 MG capsule Take 1 capsule (200 mg total) by mouth daily as needed for pain. Take with food. 30 capsule 0    cetirizine (ZYRTEC) 10 MG tablet Take 10 mg by mouth daily.      dapagliflozin propanediol (FARXIGA) 10 MG TABS tablet TAKE 1 TABLET BY MOUTH ONCE DAILY 90 tablet 3    escitalopram (LEXAPRO) 20 MG tablet TAKE 1 TABLET BY MOUTH DAILY 90 tablet 12    Evolocumab (REPATHA SURECLICK) 338 MG/ML SOAJ Inject 140 mg into the skin every 14 (fourteen) days.      fenofibrate 160 MG tablet Take 1 tablet (160 mg total) by mouth daily. 90 tablet 3    HYDROcodone-acetaminophen (NORCO) 10-325 MG tablet Take 1 tablet by mouth four times a day as needed for pain 120 tablet 0    icosapent Ethyl (VASCEPA) 1 g capsule TAKE 2 CAPSULES BY MOUTH TWICE DAILY 120 capsule 5    insulin regular human CONCENTRATED (HUMULIN R) 500 UNIT/ML injection USE IN INSULIN PUMP AS DIRECTED. USE 30 UNIT MARKINGS (150 UNITS) 3 TIMES A DAY. 60 mL 3    Lysine 500 MG TABS Take 500 mg by mouth daily.      metoprolol succinate (TOPROL-XL) 25 MG 24 hr tablet TAKE 1 TABLET BY MOUTH DAILY 90 tablet 2    montelukast (SINGULAIR) 10 MG tablet Take 1 tablet by mouth once daily (Patient taking differently: Take 10 mg by mouth daily as needed (allergies).) 90 tablet 3    nitroGLYCERIN (NITROSTAT) 0.4 MG SL tablet PLACE 1 TABLET (0.4 MG TOTAL)  UNDER THE TONGUE EVERY 5 (FIVE) MINUTES X 3 DOSES AS NEEDED FOR CHEST PAIN. DIAL 911 AFTER 3 DOSES 25 tablet 3    pregabalin (LYRICA) 150 MG capsule Take 150 mg by mouth 2 (two) times daily.      ramipril (ALTACE) 10 MG capsule Take 1 capsule by mouth once daily 90 capsule 4    sildenafil (VIAGRA) 100 MG tablet TAKE 1 TABLET BY MOUTH ONCE DAILY AS NEEDED 30 tablet 4    tirzepatide (MOUNJARO) 10 MG/0.5ML Pen Inject 10 mg under the skin once a week 2 mL 6    valACYclovir (VALTREX) 1000 MG tablet TAKE 2 TABLETS BY MOUTH EVERY 12 HOURS FOR 24 HOURS AS NEEDED FOR OUTBREAK AS DIRECTED 30 tablet 1    glucose blood (ACCU-CHEK GUIDE) test strip Check blood sugars 10 times a day as directed 900 strip 3    glucose blood (CONTOUR NEXT TEST)  test strip USE TO SELF-MONITOR BLOOD GLUCOSE UP TO 10 TIMES DAILY (Patient not taking: Reported on 03/22/2022) 900 strip 3    glucose blood (CONTOUR NEXT TEST) test strip Use to self-monitor blood glucose up to 10 times daily 900 strip 3    meloxicam (MOBIC) 15 MG tablet Take 1 tablet by mouth once a day with food (Patient not taking: Reported on 05/14/2022) 30 tablet 1 Not Taking   montelukast (SINGULAIR) 10 MG tablet Take 1 tablet by mouth daily as needed (Patient not taking: Reported on 05/14/2022) 30 tablet 3 Not Taking   ONE TOUCH ULTRA TEST test strip 1 each by Other route as needed for other (blood sugar).   6    pregabalin (LYRICA) 50 MG capsule TAKE 1 CAPSULE BY MOUTH TWICE DAILY (Patient not taking: Reported on 05/14/2022) 60 capsule 5 Not Taking   tirzepatide (MOUNJARO) 15 MG/0.5ML Pen Inject 15 mg into the skin once a week. 2 mL 6    tirzepatide (MOUNJARO) 7.5 MG/0.5ML Pen Inject 7.5 mg into the skin once a week. (Patient not taking: Reported on 05/14/2022) 2 mL 6 Not Taking   Allergies  Allergen Reactions   Percocet [Oxycodone-Acetaminophen] Nausea Only    Makes patient feel not with it    Social History   Tobacco Use   Smoking status: Never    Passive  exposure: Yes   Smokeless tobacco: Never   Tobacco comments:    tried for less than a year when 16  Substance Use Topics   Alcohol use: Yes    Comment: occ    Family History  Problem Relation Age of Onset   Coronary artery disease Mother        MI in her 60's   Heart attack Mother    Diabetes Mother    Emphysema Mother    Prostate cancer Father    Lung cancer Father    Diabetes Father    Asthma Son    Colon cancer Neg Hx      Review of Systems  Musculoskeletal:  Positive for arthralgias.       Left knee  All other systems reviewed and are negative.   Objective:  Physical Exam Constitutional:      Appearance: Normal appearance.  HENT:     Head: Normocephalic and atraumatic.     Mouth/Throat:     Pharynx: Oropharynx is clear.  Eyes:     Extraocular Movements: Extraocular movements intact.  Cardiovascular:     Rate and Rhythm: Normal rate.  Pulmonary:     Effort: Pulmonary effort is normal.  Abdominal:     Palpations: Abdomen is soft.  Musculoskeletal:     Comments: Examination left knee shows range of motion from about 5-100 of flexion.  1+ crepitation.  No effusion.  He is tenderness palpation mostly medially.  His ligaments are stable.  Calf is soft and nontender.  Hip range of motion is full without pain.  He is neurovascularly intact distally.    Skin:    General: Skin is warm and dry.  Neurological:     General: No focal deficit present.     Mental Status: He is alert and oriented to person, place, and time.  Psychiatric:        Mood and Affect: Mood normal.        Behavior: Behavior normal.        Thought Content: Thought content normal.        Judgment: Judgment normal.  Vital signs in last 24 hours:    Labs:   Estimated body mass index is 38.33 kg/m as calculated from the following:   Height as of 05/21/22: 5' 10.5" (1.791 m).   Weight as of 05/21/22: 122.9 kg.   Imaging Review Plain radiographs demonstrate severe degenerative joint  disease of the left knee(s). The overall alignment isneutral. The bone quality appears to be good for age and reported activity level.      Assessment/Plan:  End stage primary arthritis, left knee   The patient history, physical examination, clinical judgment of the provider and imaging studies are consistent with end stage degenerative joint disease of the left knee(s) and total knee arthroplasty is deemed medically necessary. The treatment options including medical management, injection therapy arthroscopy and arthroplasty were discussed at length. The risks and benefits of total knee arthroplasty were presented and reviewed. The risks due to aseptic loosening, infection, stiffness, patella tracking problems, thromboembolic complications and other imponderables were discussed. The patient acknowledged the explanation, agreed to proceed with the plan and consent was signed. Patient is being admitted for inpatient treatment for surgery, pain control, PT, OT, prophylactic antibiotics, VTE prophylaxis, progressive ambulation and ADL's and discharge planning. The patient is planning to be discharged home with home health services     Patient's anticipated LOS is less than 2 midnights, meeting these requirements: - Younger than 61 - Lives within 1 hour of care - Has a competent adult at home to recover with post-op recover - NO history of  - Chronic pain requiring opiods  - Diabetes  - Coronary Artery Disease  - Heart failure  - Heart attack  - Stroke  - DVT/VTE  - Cardiac arrhythmia  - Respiratory Failure/COPD  - Renal failure  - Anemia  - Advanced Liver disease

## 2022-05-28 ENCOUNTER — Other Ambulatory Visit (HOSPITAL_BASED_OUTPATIENT_CLINIC_OR_DEPARTMENT_OTHER): Payer: Self-pay

## 2022-05-28 ENCOUNTER — Encounter (HOSPITAL_COMMUNITY): Payer: Self-pay | Admitting: Orthopaedic Surgery

## 2022-05-28 ENCOUNTER — Ambulatory Visit (HOSPITAL_COMMUNITY): Payer: PRIVATE HEALTH INSURANCE | Admitting: Physician Assistant

## 2022-05-28 ENCOUNTER — Ambulatory Visit (HOSPITAL_COMMUNITY)
Admission: RE | Admit: 2022-05-28 | Discharge: 2022-05-28 | Disposition: A | Discharge status: Home / Self Care | Payer: PRIVATE HEALTH INSURANCE | Attending: Orthopaedic Surgery | Admitting: Orthopaedic Surgery

## 2022-05-28 ENCOUNTER — Encounter (HOSPITAL_COMMUNITY)
Admission: RE | Disposition: A | Discharge status: Home / Self Care | Payer: Self-pay | Source: Home / Self Care | Attending: Orthopaedic Surgery

## 2022-05-28 ENCOUNTER — Ambulatory Visit (HOSPITAL_BASED_OUTPATIENT_CLINIC_OR_DEPARTMENT_OTHER): Payer: PRIVATE HEALTH INSURANCE | Admitting: Anesthesiology

## 2022-05-28 ENCOUNTER — Other Ambulatory Visit (HOSPITAL_COMMUNITY): Payer: Self-pay

## 2022-05-28 ENCOUNTER — Other Ambulatory Visit: Payer: Self-pay

## 2022-05-28 DIAGNOSIS — M1712 Unilateral primary osteoarthritis, left knee: Secondary | ICD-10-CM | POA: Diagnosis not present

## 2022-05-28 DIAGNOSIS — E669 Obesity, unspecified: Secondary | ICD-10-CM | POA: Insufficient documentation

## 2022-05-28 DIAGNOSIS — Z7722 Contact with and (suspected) exposure to environmental tobacco smoke (acute) (chronic): Secondary | ICD-10-CM | POA: Diagnosis not present

## 2022-05-28 DIAGNOSIS — Z6830 Body mass index (BMI) 30.0-30.9, adult: Secondary | ICD-10-CM | POA: Insufficient documentation

## 2022-05-28 DIAGNOSIS — G4730 Sleep apnea, unspecified: Secondary | ICD-10-CM | POA: Diagnosis not present

## 2022-05-28 DIAGNOSIS — I2510 Atherosclerotic heart disease of native coronary artery without angina pectoris: Secondary | ICD-10-CM

## 2022-05-28 DIAGNOSIS — M109 Gout, unspecified: Secondary | ICD-10-CM | POA: Diagnosis not present

## 2022-05-28 DIAGNOSIS — Z794 Long term (current) use of insulin: Secondary | ICD-10-CM

## 2022-05-28 DIAGNOSIS — I10 Essential (primary) hypertension: Secondary | ICD-10-CM | POA: Insufficient documentation

## 2022-05-28 DIAGNOSIS — E119 Type 2 diabetes mellitus without complications: Secondary | ICD-10-CM | POA: Insufficient documentation

## 2022-05-28 DIAGNOSIS — Z7984 Long term (current) use of oral hypoglycemic drugs: Secondary | ICD-10-CM | POA: Insufficient documentation

## 2022-05-28 HISTORY — PX: TOTAL KNEE ARTHROPLASTY: SHX125

## 2022-05-28 LAB — GLUCOSE, CAPILLARY
Glucose-Capillary: 94 mg/dL (ref 70–99)
Glucose-Capillary: 88 mg/dL (ref 70–99)
Glucose-Capillary: 102 mg/dL — ABNORMAL HIGH (ref 70–99)

## 2022-05-28 SURGERY — ARTHROPLASTY, KNEE, TOTAL
Anesthesia: Regional | Site: Knee | Laterality: Left

## 2022-05-28 MED ORDER — BUPIVACAINE LIPOSOME 1.3 % IJ SUSP: 20.0000 mL | Freq: Once | INTRAMUSCULAR | Status: DC

## 2022-05-28 MED ORDER — PROPOFOL 10 MG/ML IV BOLUS
INTRAVENOUS | Status: DC | PRN
  Administered 2022-05-28: 200 mg via INTRAVENOUS

## 2022-05-28 MED ORDER — OXYCODONE HCL 5 MG/5ML PO SOLN: 5.0000 mg | Freq: Once | ORAL | Status: DC | PRN

## 2022-05-28 MED ORDER — KETAMINE HCL 10 MG/ML IJ SOLN
INTRAMUSCULAR | Status: DC | PRN
  Administered 2022-05-28: 30 mg via INTRAVENOUS
  Administered 2022-05-28 (×2): 10 mg via INTRAVENOUS

## 2022-05-28 MED ORDER — FENTANYL CITRATE (PF) 250 MCG/5ML IJ SOLN
INTRAMUSCULAR | Status: DC | PRN
  Administered 2022-05-28: 50 ug via INTRAVENOUS
  Administered 2022-05-28: 150 ug via INTRAVENOUS
  Administered 2022-05-28: 100 ug via INTRAVENOUS
  Administered 2022-05-28: 50 ug via INTRAVENOUS

## 2022-05-28 MED ORDER — AMISULPRIDE (ANTIEMETIC) 5 MG/2ML IV SOLN: 10.0000 mg | Freq: Once | INTRAVENOUS | Status: DC | PRN

## 2022-05-28 MED ORDER — OXYCODONE HCL 5 MG PO TABS: 5.0000 mg | ORAL_TABLET | Freq: Once | ORAL | Status: DC | PRN

## 2022-05-28 MED ORDER — LACTATED RINGERS IV SOLN: INTRAVENOUS | Status: DC

## 2022-05-28 MED ORDER — MIDAZOLAM HCL 2 MG/2ML IJ SOLN
INTRAMUSCULAR | Status: AC
  Filled 2022-05-28: qty 2

## 2022-05-28 MED ORDER — TRANEXAMIC ACID-NACL 1000-0.7 MG/100ML-% IV SOLN
1000.0000 mg | INTRAVENOUS | Status: AC
  Administered 2022-05-28: 1000 mg via INTRAVENOUS
  Filled 2022-05-28: qty 100

## 2022-05-28 MED ORDER — ONDANSETRON HCL 4 MG/2ML IJ SOLN
INTRAMUSCULAR | Status: AC
  Filled 2022-05-28: qty 2

## 2022-05-28 MED ORDER — METHOCARBAMOL 500 MG IVPB - SIMPLE MED
INTRAVENOUS | Status: AC
  Filled 2022-05-28: qty 55

## 2022-05-28 MED ORDER — BUPIVACAINE-EPINEPHRINE (PF) 0.5% -1:200000 IJ SOLN
INTRAMUSCULAR | Status: AC
  Filled 2022-05-28: qty 30

## 2022-05-28 MED ORDER — SUGAMMADEX SODIUM 500 MG/5ML IV SOLN
INTRAVENOUS | Status: AC
  Filled 2022-05-28: qty 5

## 2022-05-28 MED ORDER — BUPIVACAINE LIPOSOME 1.3 % IJ SUSP
INTRAMUSCULAR | Status: AC
  Filled 2022-05-28: qty 20

## 2022-05-28 MED ORDER — LACTATED RINGERS IV BOLUS
500.0000 mL | Freq: Once | INTRAVENOUS | Status: AC
  Administered 2022-05-28: 500 mL via INTRAVENOUS

## 2022-05-28 MED ORDER — FENTANYL CITRATE PF 50 MCG/ML IJ SOSY
25.0000 ug | PREFILLED_SYRINGE | INTRAMUSCULAR | Status: DC | PRN
  Administered 2022-05-28: 50 ug via INTRAVENOUS
  Administered 2022-05-28: 25 ug via INTRAVENOUS

## 2022-05-28 MED ORDER — BUPIVACAINE-EPINEPHRINE 0.5% -1:200000 IJ SOLN
INTRAMUSCULAR | Status: DC | PRN
  Administered 2022-05-28: 30 mL

## 2022-05-28 MED ORDER — EPHEDRINE SULFATE-NACL 50-0.9 MG/10ML-% IV SOSY
PREFILLED_SYRINGE | INTRAVENOUS | Status: DC | PRN
  Administered 2022-05-28 (×2): 5 mg via INTRAVENOUS

## 2022-05-28 MED ORDER — FENTANYL CITRATE PF 50 MCG/ML IJ SOSY
50.0000 ug | PREFILLED_SYRINGE | INTRAMUSCULAR | Status: DC
  Filled 2022-05-28: qty 2

## 2022-05-28 MED ORDER — TRANEXAMIC ACID 1000 MG/10ML IV SOLN
2000.0000 mg | INTRAVENOUS | Status: DC
  Filled 2022-05-28: qty 20

## 2022-05-28 MED ORDER — POVIDONE-IODINE 10 % EX SWAB
2.0000 | Freq: Once | CUTANEOUS | Status: AC
  Administered 2022-05-28: 2 via TOPICAL

## 2022-05-28 MED ORDER — HYDROMORPHONE HCL 1 MG/ML IJ SOLN
INTRAMUSCULAR | Status: DC | PRN
  Administered 2022-05-28: .8 mg via INTRAVENOUS
  Administered 2022-05-28: .4 mg via INTRAVENOUS

## 2022-05-28 MED ORDER — BUPIVACAINE-EPINEPHRINE (PF) 0.5% -1:200000 IJ SOLN
INTRAMUSCULAR | Status: DC | PRN
  Administered 2022-05-28: 30 mL via PERINEURAL

## 2022-05-28 MED ORDER — FENTANYL CITRATE PF 50 MCG/ML IJ SOSY
PREFILLED_SYRINGE | INTRAMUSCULAR | Status: AC
  Administered 2022-05-28: 50 ug via INTRAVENOUS
  Filled 2022-05-28: qty 1

## 2022-05-28 MED ORDER — ORAL CARE MOUTH RINSE: 15.0000 mL | Freq: Once | OROMUCOSAL | Status: AC

## 2022-05-28 MED ORDER — PROMETHAZINE HCL 25 MG/ML IJ SOLN: 6.2500 mg | INTRAMUSCULAR | Status: DC | PRN

## 2022-05-28 MED ORDER — STERILE WATER FOR IRRIGATION IR SOLN
Status: DC | PRN
  Administered 2022-05-28: 2000 mL

## 2022-05-28 MED ORDER — ACETAMINOPHEN 500 MG PO TABS
500.0000 mg | ORAL_TABLET | Freq: Four times a day (QID) | ORAL | Status: DC
  Administered 2022-05-28: 500 mg via ORAL

## 2022-05-28 MED ORDER — BUPIVACAINE LIPOSOME 1.3 % IJ SUSP
INTRAMUSCULAR | Status: DC | PRN
  Administered 2022-05-28: 20 mL

## 2022-05-28 MED ORDER — TIZANIDINE HCL 4 MG PO TABS
4.0000 mg | ORAL_TABLET | Freq: Four times a day (QID) | ORAL | 1 refills | Status: DC | PRN
  Filled 2022-05-28 – 2022-06-07 (×4): qty 40, 10d supply, fill #0

## 2022-05-28 MED ORDER — HYDROCODONE-ACETAMINOPHEN 5-325 MG PO TABS: 1.0000 | ORAL_TABLET | ORAL | Status: DC | PRN

## 2022-05-28 MED ORDER — ONDANSETRON HCL 4 MG/2ML IJ SOLN: 4.0000 mg | Freq: Four times a day (QID) | INTRAMUSCULAR | Status: DC | PRN

## 2022-05-28 MED ORDER — FENTANYL CITRATE (PF) 100 MCG/2ML IJ SOLN
INTRAMUSCULAR | Status: AC
  Filled 2022-05-28: qty 2

## 2022-05-28 MED ORDER — CEFAZOLIN IN SODIUM CHLORIDE 3-0.9 GM/100ML-% IV SOLN
INTRAVENOUS | Status: AC
  Filled 2022-05-28: qty 100

## 2022-05-28 MED ORDER — MORPHINE SULFATE (PF) 2 MG/ML IV SOLN: 0.5000 mg | INTRAVENOUS | Status: DC | PRN

## 2022-05-28 MED ORDER — PROPOFOL 10 MG/ML IV BOLUS
INTRAVENOUS | Status: AC
  Filled 2022-05-28: qty 20

## 2022-05-28 MED ORDER — LACTATED RINGERS IV BOLUS
250.0000 mL | Freq: Once | INTRAVENOUS | Status: AC
  Administered 2022-05-28: 250 mL via INTRAVENOUS

## 2022-05-28 MED ORDER — 0.9 % SODIUM CHLORIDE (POUR BTL) OPTIME
TOPICAL | Status: DC | PRN
  Administered 2022-05-28: 1000 mL

## 2022-05-28 MED ORDER — HYDROCODONE-ACETAMINOPHEN 10-325 MG PO TABS
1.0000 | ORAL_TABLET | Freq: Four times a day (QID) | ORAL | 0 refills | Status: DC | PRN
  Filled 2022-05-28: qty 40, 5d supply, fill #0

## 2022-05-28 MED ORDER — ROCURONIUM BROMIDE 10 MG/ML (PF) SYRINGE
PREFILLED_SYRINGE | INTRAVENOUS | Status: DC | PRN
  Administered 2022-05-28: 10 mg via INTRAVENOUS
  Administered 2022-05-28: 60 mg via INTRAVENOUS

## 2022-05-28 MED ORDER — KETAMINE HCL 50 MG/5ML IJ SOSY
PREFILLED_SYRINGE | INTRAMUSCULAR | Status: AC
  Filled 2022-05-28: qty 5

## 2022-05-28 MED ORDER — ACETAMINOPHEN 500 MG PO TABS
ORAL_TABLET | ORAL | Status: AC
  Filled 2022-05-28: qty 1

## 2022-05-28 MED ORDER — DEXAMETHASONE SODIUM PHOSPHATE 10 MG/ML IJ SOLN
INTRAMUSCULAR | Status: DC | PRN
  Administered 2022-05-28: 10 mg via INTRAVENOUS

## 2022-05-28 MED ORDER — ONDANSETRON HCL 4 MG PO TABS: 4.0000 mg | ORAL_TABLET | Freq: Four times a day (QID) | ORAL | Status: DC | PRN

## 2022-05-28 MED ORDER — ONDANSETRON HCL 4 MG/2ML IJ SOLN
INTRAMUSCULAR | Status: DC | PRN
  Administered 2022-05-28: 4 mg via INTRAVENOUS

## 2022-05-28 MED ORDER — CHLORHEXIDINE GLUCONATE 0.12 % MT SOLN
15.0000 mL | Freq: Once | OROMUCOSAL | Status: AC
  Administered 2022-05-28: 15 mL via OROMUCOSAL

## 2022-05-28 MED ORDER — METOCLOPRAMIDE HCL 5 MG/ML IJ SOLN: 5.0000 mg | Freq: Three times a day (TID) | INTRAMUSCULAR | Status: DC | PRN

## 2022-05-28 MED ORDER — ASPIRIN 81 MG PO TBEC
81.0000 mg | DELAYED_RELEASE_TABLET | Freq: Two times a day (BID) | ORAL | 0 refills | Status: AC
  Filled 2022-05-28: qty 45, 23d supply, fill #0

## 2022-05-28 MED ORDER — FENTANYL CITRATE PF 50 MCG/ML IJ SOSY
PREFILLED_SYRINGE | INTRAMUSCULAR | Status: AC
  Filled 2022-05-28: qty 1

## 2022-05-28 MED ORDER — CEFAZOLIN IN SODIUM CHLORIDE 3-0.9 GM/100ML-% IV SOLN
3.0000 g | INTRAVENOUS | Status: AC
  Administered 2022-05-28: 3 g via INTRAVENOUS
  Filled 2022-05-28: qty 100

## 2022-05-28 MED ORDER — LACTATED RINGERS IV BOLUS: 250.0000 mL | Freq: Once | INTRAVENOUS | Status: DC

## 2022-05-28 MED ORDER — HYDROCODONE-ACETAMINOPHEN 7.5-325 MG PO TABS: 1.0000 | ORAL_TABLET | ORAL | Status: DC | PRN

## 2022-05-28 MED ORDER — TRANEXAMIC ACID 1000 MG/10ML IV SOLN
INTRAVENOUS | Status: DC | PRN
  Administered 2022-05-28: 2000 mg via TOPICAL

## 2022-05-28 MED ORDER — SUGAMMADEX SODIUM 500 MG/5ML IV SOLN
INTRAVENOUS | Status: DC | PRN
  Administered 2022-05-28: 300 mg via INTRAVENOUS

## 2022-05-28 MED ORDER — FENTANYL CITRATE (PF) 250 MCG/5ML IJ SOLN
INTRAMUSCULAR | Status: AC
  Filled 2022-05-28: qty 5

## 2022-05-28 MED ORDER — ROCURONIUM BROMIDE 10 MG/ML (PF) SYRINGE
PREFILLED_SYRINGE | INTRAVENOUS | Status: AC
  Filled 2022-05-28: qty 10

## 2022-05-28 MED ORDER — METOCLOPRAMIDE HCL 5 MG PO TABS: 5.0000 mg | ORAL_TABLET | Freq: Three times a day (TID) | ORAL | Status: DC | PRN

## 2022-05-28 MED ORDER — METHOCARBAMOL 500 MG IVPB - SIMPLE MED
500.0000 mg | Freq: Four times a day (QID) | INTRAVENOUS | Status: DC | PRN
  Administered 2022-05-28: 500 mg via INTRAVENOUS

## 2022-05-28 MED ORDER — METHOCARBAMOL 500 MG PO TABS: 500.0000 mg | ORAL_TABLET | Freq: Four times a day (QID) | ORAL | Status: DC | PRN

## 2022-05-28 MED ORDER — HYDROCODONE-ACETAMINOPHEN 5-325 MG PO TABS
ORAL_TABLET | ORAL | Status: AC
  Filled 2022-05-28: qty 1

## 2022-05-28 MED ORDER — MIDAZOLAM HCL 2 MG/2ML IJ SOLN
1.0000 mg | INTRAMUSCULAR | Status: AC
  Administered 2022-05-28: 2 mg via INTRAVENOUS
  Filled 2022-05-28: qty 2

## 2022-05-28 MED ORDER — ACETAMINOPHEN 325 MG PO TABS: 325.0000 mg | ORAL_TABLET | Freq: Four times a day (QID) | ORAL | Status: DC | PRN

## 2022-05-28 MED ORDER — LIDOCAINE 2% (20 MG/ML) 5 ML SYRINGE
INTRAMUSCULAR | Status: DC | PRN
  Administered 2022-05-28: 60 mg via INTRAVENOUS

## 2022-05-28 MED ORDER — METOPROLOL SUCCINATE ER 25 MG PO TB24
25.0000 mg | ORAL_TABLET | Freq: Once | ORAL | Status: AC
  Administered 2022-05-28: 25 mg via ORAL
  Filled 2022-05-28: qty 1

## 2022-05-28 MED ORDER — SODIUM CHLORIDE (PF) 0.9 % IJ SOLN
INTRAMUSCULAR | Status: DC | PRN
  Administered 2022-05-28: 30 mL

## 2022-05-28 MED ORDER — ACETAMINOPHEN 500 MG PO TABS
1000.0000 mg | ORAL_TABLET | Freq: Once | ORAL | Status: AC
  Administered 2022-05-28: 1000 mg via ORAL
  Filled 2022-05-28: qty 2

## 2022-05-28 MED ORDER — EPHEDRINE 5 MG/ML INJ
INTRAVENOUS | Status: AC
  Filled 2022-05-28: qty 5

## 2022-05-28 MED ORDER — KETOROLAC TROMETHAMINE 15 MG/ML IJ SOLN: 7.5000 mg | Freq: Four times a day (QID) | INTRAMUSCULAR | Status: DC

## 2022-05-28 MED ORDER — SODIUM CHLORIDE (PF) 0.9 % IJ SOLN
INTRAMUSCULAR | Status: AC
  Filled 2022-05-28: qty 30

## 2022-05-28 MED ORDER — DEXAMETHASONE SODIUM PHOSPHATE 10 MG/ML IJ SOLN
INTRAMUSCULAR | Status: AC
  Filled 2022-05-28: qty 1

## 2022-05-28 MED ORDER — HYDROMORPHONE HCL 2 MG/ML IJ SOLN
INTRAMUSCULAR | Status: AC
  Filled 2022-05-28: qty 1

## 2022-05-28 MED ORDER — SODIUM CHLORIDE 0.9 % IR SOLN
Status: DC | PRN
  Administered 2022-05-28: 3000 mL

## 2022-05-28 MED ORDER — HYDROCODONE-ACETAMINOPHEN 5-325 MG PO TABS
ORAL_TABLET | ORAL | Status: AC
  Administered 2022-05-28: 2 via ORAL
  Filled 2022-05-28: qty 1

## 2022-05-28 MED ORDER — KETOROLAC TROMETHAMINE 30 MG/ML IJ SOLN
INTRAMUSCULAR | Status: AC
  Administered 2022-05-28: 15 mg
  Filled 2022-05-28: qty 1

## 2022-05-28 SURGICAL SUPPLY — 56 items
ATTUNE MED DOME PAT 41 KNEE (Knees) IMPLANT
ATTUNE PS FEM LT SZ 8 CEM KNEE (Femur) IMPLANT
ATTUNE PSRP INSR SZ8 5 KNEE (Insert) IMPLANT
BAG COUNTER SPONGE SURGICOUNT (BAG) ×2 IMPLANT
BAG DECANTER FOR FLEXI CONT (MISCELLANEOUS) ×2 IMPLANT
BAG SPEC THK2 15X12 ZIP CLS (MISCELLANEOUS) ×1
BAG SPNG CNTER NS LX DISP (BAG) ×1
BAG ZIPLOCK 12X15 (MISCELLANEOUS) ×2 IMPLANT
BASE TIBIAL ROT PLAT SZ 8 KNEE (Knees) IMPLANT
BLADE SAGITTAL 25.0X1.19X90 (BLADE) ×2 IMPLANT
BLADE SAW SGTL 11.0X1.19X90.0M (BLADE) ×2 IMPLANT
BLADE SURG SZ10 CARB STEEL (BLADE) ×2 IMPLANT
BNDG ELASTIC 6X5.8 VLCR STR LF (GAUZE/BANDAGES/DRESSINGS) ×2 IMPLANT
BOOTIES KNEE HIGH SLOAN (MISCELLANEOUS) ×2 IMPLANT
BOWL SMART MIX CTS (DISPOSABLE) ×2 IMPLANT
BSPLAT TIB 8 CMNT ROT PLAT STR (Knees) ×1 IMPLANT
CEMENT HV SMART SET (Cement) ×4 IMPLANT
COVER SURGICAL LIGHT HANDLE (MISCELLANEOUS) ×2 IMPLANT
CUFF TOURN SGL QUICK 34 (TOURNIQUET CUFF) ×1
CUFF TRNQT CYL 34X4.125X (TOURNIQUET CUFF) ×2 IMPLANT
DRAPE INCISE IOBAN 66X45 STRL (DRAPES) ×2 IMPLANT
DRAPE SHEET LG 3/4 BI-LAMINATE (DRAPES) ×2 IMPLANT
DRAPE TOP 10253 STERILE (DRAPES) ×2 IMPLANT
DRAPE U-SHAPE 47X51 STRL (DRAPES) ×2 IMPLANT
DRSG AQUACEL AG ADV 3.5X10 (GAUZE/BANDAGES/DRESSINGS) ×2 IMPLANT
DURAPREP 26ML APPLICATOR (WOUND CARE) ×4 IMPLANT
ELECT REM PT RETURN 15FT ADLT (MISCELLANEOUS) ×2 IMPLANT
GLOVE BIO SURGEON STRL SZ7 (GLOVE) IMPLANT
GLOVE BIO SURGEON STRL SZ8 (GLOVE) ×4 IMPLANT
GLOVE BIOGEL PI IND STRL 6.5 (GLOVE) IMPLANT
GLOVE BIOGEL PI IND STRL 7.0 (GLOVE) IMPLANT
GLOVE BIOGEL PI IND STRL 8 (GLOVE) ×4 IMPLANT
GOWN STRL REUS W/ TWL XL LVL3 (GOWN DISPOSABLE) ×4 IMPLANT
GOWN STRL REUS W/TWL XL LVL3 (GOWN DISPOSABLE) ×3
HANDPIECE INTERPULSE COAX TIP (DISPOSABLE) ×1
HOLDER FOLEY CATH W/STRAP (MISCELLANEOUS) IMPLANT
HOOD PEEL AWAY FLYTE STAYCOOL (MISCELLANEOUS) ×6 IMPLANT
KIT TURNOVER KIT A (KITS) IMPLANT
MANIFOLD NEPTUNE II (INSTRUMENTS) ×2 IMPLANT
NEEDLE HYPO 22GX1.5 SAFETY (NEEDLE) ×2 IMPLANT
NS IRRIG 1000ML POUR BTL (IV SOLUTION) ×2 IMPLANT
PACK TOTAL KNEE CUSTOM (KITS) ×2 IMPLANT
PAD ARMBOARD 7.5X6 YLW CONV (MISCELLANEOUS) ×2 IMPLANT
PROTECTOR NERVE ULNAR (MISCELLANEOUS) ×2 IMPLANT
SET HNDPC FAN SPRY TIP SCT (DISPOSABLE) ×2 IMPLANT
SPIKE FLUID TRANSFER (MISCELLANEOUS) ×4 IMPLANT
SUT ETHIBOND NAB CT1 #1 30IN (SUTURE) ×4 IMPLANT
SUT VIC AB 0 CT1 36 (SUTURE) ×2 IMPLANT
SUT VIC AB 2-0 CT1 27 (SUTURE) ×1
SUT VIC AB 2-0 CT1 TAPERPNT 27 (SUTURE) ×2 IMPLANT
SUT VICRYL AB 3-0 FS1 BRD 27IN (SUTURE) ×2 IMPLANT
SUT VLOC 180 0 24IN GS25 (SUTURE) ×2 IMPLANT
TIBIAL BASE ROT PLAT SZ 8 KNEE (Knees) ×1 IMPLANT
WATER STERILE IRR 1000ML POUR (IV SOLUTION) ×2 IMPLANT
WRAP KNEE MAXI GEL POST OP (GAUZE/BANDAGES/DRESSINGS) ×2 IMPLANT
YANKAUER SUCT BULB TIP NO VENT (SUCTIONS) ×2 IMPLANT

## 2022-05-28 NOTE — Op Note (Signed)
PREOP DIAGNOSIS: DJD LEFT KNEE POSTOP DIAGNOSIS:  same PROCEDURE: LEFT TKR ANESTHESIA: General ATTENDING SURGEON: Hessie Dibble ASSISTANT: Alec Dolly PA  INDICATIONS FOR PROCEDURE: Alec [name] is a 61 y.o. male who has struggled for a long time with pain due to degenerative arthritis of the left knee.  The patient has failed many conservative non-operative measures and at this point has pain which limits the ability to sleep and walk.  The patient is offered total knee replacement.  Informed operative consent was obtained after discussion of possible risks of anesthesia, infection, neurovascular injury, DVT, and death.  The importance of the post-operative rehabilitation protocol to optimize result was stressed extensively with the patient.  SUMMARY OF FINDINGS AND PROCEDURE:  Alec [name] was taken to the operative suite where under the above anesthesia a left knee replacement was performed.  There were advanced degenerative changes and the bone quality was excellent.  We used the DePuyAttune system and placed size 8 femur, 8 tibia, 41 mm all polyethylene patella, and a size 5 mm spacer.  Alec Dolly PA-C assisted throughout and was invaluable to the completion of the case in that he helped retract and maintain exposure while I placed the components.  He also helped close thereby minimizing OR time.  The patient was admitted for appropriate post-op care to include perioperative antibiotics and mechanical and pharmacologic measures for DVT prophylaxis.  DESCRIPTION OF PROCEDURE:  Alec [name] was taken to the operative suite where the above anesthesia was applied.  The patient was positioned supine and prepped and draped in normal sterile fashion.  An appropriate time out was performed.  After the administration of Ancef pre-op antibiotic the leg was elevated and exsanguinated and a tourniquet inflated.  A standard longitudinal incision was made on the anterior knee.  Dissection was carried  down to the extensor mechanism.  All appropriate anti-infective measures were used including the pre-operative antibiotic, betadine impregnated drape, and closed hooded exhaust systems for each member of the surgical team.  A medial parapatellar incision was made in the extensor mechanism and the knee cap flipped and the knee flexed.  Some residual meniscal tissues were removed along with any remaining ACL/PCL tissue.  A guide was placed on the tibia and a flat cut was made on it's superior surface.  An intramedullary guide was placed in the femur and was utilized to make anterior and posterior cuts creating an appropriate flexion gap.  A second intramedullary guide was placed in the femur to make a distal cut properly balancing the knee with an extension gap equal to the flexion gap.  The three bones sized to the above mentioned sizes and the appropriate guides were placed and utilized.  A trial reduction was done and the knee easily came to full extension and the patella tracked well on flexion.  The trial components were removed and all bones were cleaned with pulsatile lavage and then dried thoroughly.  Cement was mixed and was pressurized onto the bones followed by placement of the aforementioned components.  Excess cement was trimmed and pressure was held on the components until the cement had hardened.  The tourniquet was deflated and a small amount of bleeding was controlled with cautery and pressure.  The knee was irrigated thoroughly.  The extensor mechanism was re-approximated with #1 ethibond in interrupted fashion.  The knee was flexed and the repair was solid.  The subcutaneous tissues were re-approximated with #0 and #2-0 vicryl and the skin closed with  a subcuticular stitch and steristrips.  A sterile dressing was applied.  Intraoperative fluids, EBL, and tourniquet time can be obtained from anesthesia records.  DISPOSITION:  The patient was taken to recovery room in stable condition and scheduled  to potentially go home same day depending on ability to walk and tolerate liquids.  Hessie Dibble 05/28/2022, 11:27 AM

## 2022-05-28 NOTE — Evaluation (Signed)
Physical Therapy Evaluation Patient Details Name: Alec [name] MRN: [id number] DOB: 1960/12/19 Today's Date: 05/28/2022  History of Present Illness  Pt is a 61yo male presenting s/p L-TKA on 05/28/22. PMH: CAD, chronic lower back pain, gout, HTN, metabolic syndrome, HLD, OSA on CPAP, DM, anterolateral lumbar fusion L3-L4 2021, neurogenic claudication   Clinical Impression  Alec [name] is a 61 y.o. male POD 0 s/p L-TKA. Patient reports independence with mobility at baseline. Patient is now limited by functional impairments (see PT problem list below) and requires min guard for transfers and gait with RW. Patient was able to ambulate 60 feet with RW and min guard and cues for safe walker management. Patient educated on safe sequencing for stair mobility and verbalized safe guarding position for people assisting with mobility. Patient instructed in exercises to facilitate ROM and circulation. Patient will benefit from continued skilled PT interventions to address impairments and progress towards PLOF. Patient has met mobility goals at adequate level for discharge home; will continue to follow if pt continues acute stay to progress towards Mod I goals.       Recommendations for follow up therapy are one component of a multi-disciplinary discharge planning process, led by the attending physician.  Recommendations may be updated based on patient status, additional functional criteria and insurance authorization.  Follow Up Recommendations Follow physician's recommendations for discharge plan and follow up therapies      Assistance Recommended at Discharge Intermittent Supervision/Assistance  Patient can return home with the following  A little help with walking and/or transfers;A little help with bathing/dressing/bathroom;Assistance with cooking/housework;Assist for transportation;Help with stairs or ramp for entrance    Equipment Recommendations Rolling walker (2 wheels) (Trialed pt's RW and it  is unsafe, bent posts, in disrepair. Pt requires new RW today.)  Recommendations for Other Services       Functional Status Assessment Patient has had a recent decline in their functional status and demonstrates the ability to make significant improvements in function in a reasonable and predictable amount of time.     Precautions / Restrictions Precautions Precautions: Knee Precaution Booklet Issued: No Precaution Comments: no pillow under the knee Restrictions Weight Bearing Restrictions: No Other Position/Activity Restrictions: wbat      Mobility  Bed Mobility Overal bed mobility: Modified Independent             General bed mobility comments: increased time    Transfers Overall transfer level: Needs assistance Equipment used: Rolling walker (2 wheels) Transfers: Sit to/from Stand Sit to Stand: Min guard, From elevated surface           General transfer comment: For safety only from stretcher in PACU    Ambulation/Gait Ambulation/Gait assistance: Supervision, Min guard Gait Distance (Feet): 60 Feet Assistive device: Rolling walker (2 wheels) Gait Pattern/deviations: Step-to pattern Gait velocity: decreased     General Gait Details: Pt ambulated with RW and min guard progressed to SUP, no physical assist required or overt LOB noted.  Stairs Stairs: Yes Stairs assistance: Min guard Stair Management: One rail Left, Step to pattern, Sideways Number of Stairs: 2 General stair comments: Pt educated on sideways stair mobility with railing on the L, handout provided, pt and wife verbalized understanding. Demonstrated safe stair mobility with min guard, VCs for sequencing, no overt LOB noted or physical assist required.  Wheelchair Mobility    Modified Rankin (Stroke Patients Only)       Balance Overall balance assessment: Mild deficits observed, not formally tested  Pertinent Vitals/Pain Pain  Assessment Pain Assessment: 0-10 Pain Score: 3  Pain Location: L knee Pain Descriptors / Indicators: Operative site guarding Pain Intervention(s): Limited activity within patient's tolerance, Monitored during session, Repositioned, Ice applied    Home Living Family/patient expects to be discharged to:: Private residence Living Arrangements: Spouse/significant other;Children Available Help at Discharge: Family;Available 24 hours/day Type of Home: House Home Access: Stairs to enter Entrance Stairs-Rails: Psychiatric nurse of Steps: 5   Home Layout: One level Home Equipment: Cane - single point      Prior Function Prior Level of Function : Independent/Modified Independent;Driving;Working/employed (Working as Conservation officer, nature)             Mobility Comments: IND ADLs Comments: IND     Hand Dominance   Dominant Hand: Right    Extremity/Trunk Assessment   Upper Extremity Assessment Upper Extremity Assessment: Overall WFL for tasks assessed    Lower Extremity Assessment Lower Extremity Assessment: RLE deficits/detail;LLE deficits/detail RLE Deficits / Details: MMT ank DF/DF 5/5 RLE Sensation: WNL LLE Deficits / Details: MMT ank DF/PF 5/5, No extensor lag noted LLE Sensation: WNL    Cervical / Trunk Assessment Cervical / Trunk Assessment: Normal  Communication   Communication: No difficulties  Cognition Arousal/Alertness: Awake/alert Behavior During Therapy: WFL for tasks assessed/performed Overall Cognitive Status: Within Functional Limits for tasks assessed                                          General Comments General comments (skin integrity, edema, etc.): Wife Alec [name] present    Exercises Total Joint Exercises Ankle Circles/Pumps: AROM, Both, 10 reps Quad Sets: 5 reps, Both, AROM Short Arc Quad: AROM, Left, Other reps (comment) (2) Heel Slides: AROM, Left, Other reps (comment) (2) Hip ABduction/ADduction: AROM,  Left, 5 reps Straight Leg Raises: AROM, Left, 5 reps Goniometric ROM: -5-80deg visual approximation   Assessment/Plan    PT Assessment Patient needs continued PT services  PT Problem List Decreased strength;Decreased range of motion;Decreased activity tolerance;Decreased balance;Decreased mobility;Decreased coordination;Pain       PT Treatment Interventions DME instruction;Gait training;Stair training;Functional mobility training;Therapeutic activities;Therapeutic exercise;Balance training;Neuromuscular re-education;Patient/family education    PT Goals (Current goals can be found in the Care Plan section)  Acute Rehab PT Goals Patient Stated Goal: Get back to work. PT Goal Formulation: With patient Time For Goal Achievement: 06/04/22 Potential to Achieve Goals: Good    Frequency 7X/week     Co-evaluation               AM-PAC PT "6 Clicks" Mobility  Outcome Measure Help needed turning from your back to your side while in a flat bed without using bedrails?: None Help needed moving from lying on your back to sitting on the side of a flat bed without using bedrails?: None Help needed moving to and from a bed to a chair (including a wheelchair)?: A Little Help needed standing up from a chair using your arms (e.g., wheelchair or bedside chair)?: A Little Help needed to walk in hospital room?: A Little Help needed climbing 3-5 steps with a railing? : A Little 6 Click Score: 20    End of Session Equipment Utilized During Treatment: Gait belt Activity Tolerance: Patient tolerated treatment well;No increased pain Patient left: in chair;with call bell/phone within reach;with family/visitor present Nurse Communication: Mobility status PT Visit Diagnosis: Pain;Difficulty in walking, not elsewhere classified (R26.2) Pain -  Right/Left: Left Pain - part of body: Knee    Time: 7357-8978 PT Time Calculation (min) (ACUTE ONLY): 38 min   Charges:   PT Evaluation $PT Eval Low  Complexity: 1 Low PT Treatments $Gait Training: 8-22 mins $Therapeutic Exercise: 8-22 mins        Coolidge Breeze, PT, DPT [facility] Department Office: [phone]  Coolidge Breeze 05/28/2022, 4:55 PM

## 2022-05-28 NOTE — Anesthesia Procedure Notes (Addendum)
Procedure Name: Intubation Date/Time: 05/28/2022 9:53 AM  Performed by: Sharlette Dense, CRNAPatient Re-evaluated:Patient Re-evaluated prior to induction Oxygen Delivery Method: Circle system utilized Preoxygenation: Pre-oxygenation with 100% oxygen Induction Type: IV induction Ventilation: Mask ventilation without difficulty and Oral airway inserted - appropriate to patient size Laryngoscope Size: Glidescope and 4 Grade View: Grade I Tube type: Parker flex tip Tube size: 7.5 mm Number of attempts: 1 Airway Equipment and Method: Rigid stylet and Video-laryngoscopy Placement Confirmation: ETT inserted through vocal cords under direct vision, positive ETCO2 and breath sounds checked- equal and bilateral Secured at: 23 cm Tube secured with: Tape Dental Injury: Teeth and Oropharynx as per pre-operative assessment  Comments: Small mouth opening, large tongue.  Glidescope used

## 2022-05-28 NOTE — Anesthesia Postprocedure Evaluation (Signed)
Anesthesia Post Note  Patient: Alec [name]  Procedure(s) Performed: LEFT TOTAL KNEE ARTHROPLASTY (Left: Knee)     Patient location during evaluation: PACU Anesthesia Type: Regional and General Level of consciousness: awake Pain management: pain level controlled Vital Signs Assessment: post-procedure vital signs reviewed and stable Respiratory status: spontaneous breathing, nonlabored ventilation, respiratory function stable and patient connected to nasal cannula oxygen Cardiovascular status: blood pressure returned to baseline and stable Postop Assessment: no apparent nausea or vomiting Anesthetic complications: yes   Encounter Notable Events  Notable Event Outcome Phase Comment  Difficult to intubate - expected  Intraprocedure Filed from anesthesia note documentation.    Last Vitals:  Vitals:   05/28/22 1500 05/28/22 1604  BP: 131/81 (!) 145/94  Pulse:  67  Resp:  16  Temp:    SpO2:  95%    Last Pain:  Vitals:   05/28/22 1604  TempSrc:   PainSc: 4                  Jermane Brayboy P Anaiz Qazi

## 2022-05-28 NOTE — Transfer of Care (Signed)
Immediate Anesthesia Transfer of Care Note  Patient: Alec [name]  Procedure(s) Performed: LEFT TOTAL KNEE ARTHROPLASTY (Left: Knee)  Patient Location: PACU  Anesthesia Type:GA combined with regional for post-op pain  Level of Consciousness: awake and alert   Airway & Oxygen Therapy: Patient Spontanous Breathing and Patient connected to face mask oxygen  Post-op Assessment: Report given to RN and Post -op Vital signs reviewed and stable  Post vital signs: Reviewed and stable  Last Vitals:  Vitals Value Taken Time  BP 159/88 05/28/22 1200  Temp    Pulse 97 05/28/22 1202  Resp 15 05/28/22 1202  SpO2 93 % 05/28/22 1202  Vitals shown include unvalidated device data.  Last Pain:  Vitals:   05/28/22 0842  TempSrc: Oral      Patients Stated Pain Goal: 5 (38/46/65 9935)  Complications:  Encounter Notable Events  Notable Event Outcome Phase Comment  Difficult to intubate - expected  Intraprocedure Filed from anesthesia note documentation.

## 2022-05-28 NOTE — Interval H&P Note (Signed)
History and Physical Interval Note:  05/28/2022 8:59 AM  Alec [name]  has presented today for surgery, with the diagnosis of LEFT KNEE DEGENERATIVE JOINT DISEASE.  The various methods of treatment have been discussed with the patient and family. After consideration of risks, benefits and other options for treatment, the patient has consented to  Procedure(s): LEFT TOTAL KNEE ARTHROPLASTY (Left) as a surgical intervention.  The patient's history has been reviewed, patient examined, no change in status, stable for surgery.  I have reviewed the patient's chart and labs.  Questions were answered to the patient's satisfaction.     Hessie Dibble

## 2022-05-28 NOTE — Anesthesia Procedure Notes (Signed)
Anesthesia Regional Block: Adductor canal block   Pre-Anesthetic Checklist: , timeout performed,  Correct Patient, Correct Site, Correct Laterality,  Correct Procedure,, site marked,  Risks and benefits discussed,  Surgical consent,  Pre-op evaluation,  At surgeon's request and post-op pain management  Laterality: Left  Prep: chloraprep       Needles:  Injection technique: Single-shot  Needle Type: Echogenic Stimulator Needle     Needle Length: 10cm  Needle Gauge: 20     Additional Needles:   Procedures:,,,, ultrasound used (permanent image in chart),,    Narrative:  Start time: 05/28/2022 9:10 AM End time: 05/28/2022 9:20 AM Injection made incrementally with aspirations every 5 mL.  Performed by: Personally  Anesthesiologist: Murvin Natal, MD  Additional Notes: Functioning IV was confirmed and monitors were applied. A time-out was performed. Hand hygiene and sterile gloves were used. The thigh was placed in a frog-leg position and prepped in a sterile fashion. A 125m 20ga Bbraun echogenic stimulator needle was placed using ultrasound guidance.  Negative aspiration and negative test dose prior to incremental administration of local anesthetic. The patient tolerated the procedure well.

## 2022-05-29 ENCOUNTER — Encounter (HOSPITAL_COMMUNITY): Payer: Self-pay | Admitting: Orthopaedic Surgery

## 2022-06-04 ENCOUNTER — Other Ambulatory Visit (HOSPITAL_BASED_OUTPATIENT_CLINIC_OR_DEPARTMENT_OTHER): Payer: Self-pay

## 2022-06-04 ENCOUNTER — Other Ambulatory Visit: Payer: Self-pay | Admitting: Interventional Cardiology

## 2022-06-04 MED ORDER — HYDROCODONE-ACETAMINOPHEN 10-325 MG PO TABS
1.0000 | ORAL_TABLET | Freq: Four times a day (QID) | ORAL | 0 refills | Status: DC | PRN
  Filled 2022-06-04: qty 120, 30d supply, fill #0

## 2022-06-04 MED ORDER — FENOFIBRATE 160 MG PO TABS
160.0000 mg | ORAL_TABLET | Freq: Every day | ORAL | 3 refills | Status: DC
  Filled 2022-06-04 – 2022-08-28 (×2): qty 90, 90d supply, fill #0
  Filled 2022-12-27: qty 90, 90d supply, fill #1
  Filled 2023-03-25: qty 90, 90d supply, fill #2

## 2022-06-07 ENCOUNTER — Other Ambulatory Visit (HOSPITAL_BASED_OUTPATIENT_CLINIC_OR_DEPARTMENT_OTHER): Payer: Self-pay

## 2022-06-10 ENCOUNTER — Other Ambulatory Visit (HOSPITAL_BASED_OUTPATIENT_CLINIC_OR_DEPARTMENT_OTHER): Payer: Self-pay

## 2022-06-10 MED ORDER — CELECOXIB 200 MG PO CAPS
200.0000 mg | ORAL_CAPSULE | Freq: Every day | ORAL | 0 refills | Status: DC | PRN
  Filled 2022-06-10: qty 30, 30d supply, fill #0

## 2022-06-14 ENCOUNTER — Other Ambulatory Visit (HOSPITAL_BASED_OUTPATIENT_CLINIC_OR_DEPARTMENT_OTHER): Payer: Self-pay

## 2022-06-19 ENCOUNTER — Other Ambulatory Visit (HOSPITAL_BASED_OUTPATIENT_CLINIC_OR_DEPARTMENT_OTHER): Payer: Self-pay

## 2022-06-24 ENCOUNTER — Other Ambulatory Visit (HOSPITAL_BASED_OUTPATIENT_CLINIC_OR_DEPARTMENT_OTHER): Payer: Self-pay

## 2022-06-24 MED ORDER — CEFADROXIL 500 MG PO CAPS
500.0000 mg | ORAL_CAPSULE | Freq: Two times a day (BID) | ORAL | 0 refills | Status: DC
  Filled 2022-06-24: qty 10, 5d supply, fill #0

## 2022-07-01 ENCOUNTER — Other Ambulatory Visit (HOSPITAL_BASED_OUTPATIENT_CLINIC_OR_DEPARTMENT_OTHER): Payer: Self-pay

## 2022-07-05 ENCOUNTER — Other Ambulatory Visit (HOSPITAL_BASED_OUTPATIENT_CLINIC_OR_DEPARTMENT_OTHER): Payer: Self-pay

## 2022-07-05 MED ORDER — HYDROCODONE-ACETAMINOPHEN 10-325 MG PO TABS
1.0000 | ORAL_TABLET | Freq: Four times a day (QID) | ORAL | 0 refills | Status: DC | PRN
  Filled 2022-07-05: qty 120, 30d supply, fill #0

## 2022-07-16 ENCOUNTER — Other Ambulatory Visit (HOSPITAL_BASED_OUTPATIENT_CLINIC_OR_DEPARTMENT_OTHER): Payer: Self-pay

## 2022-07-16 MED ORDER — PREGABALIN 50 MG PO CAPS
50.0000 mg | ORAL_CAPSULE | Freq: Two times a day (BID) | ORAL | 5 refills | Status: DC
  Filled 2022-07-16: qty 60, 30d supply, fill #0

## 2022-08-04 NOTE — Progress Notes (Unsigned)
Cardiology Office Note:    Date:  08/06/2022   ID:  Giorgio Chabot Hulett, DOB 1960/09/20, MRN [id number]  PCP:  Reynold Bowen, MD  Cardiologist:  [name], MD   Referring MD: Reynold Bowen, MD   No chief complaint on file.   History of Present Illness:    Alec [name] is a 61 y.o. male with a hx of  CAD, prior unstable angina, RCA DES 2015, mixed hyperlipidemia, OSA, morbid obesity, erectile dysfunction, and type 2 DM.    He is doing well.  He has lost 40 pounds or Mounjaro.  He feels better than he has in quite some time.  Insulin doses have been significantly decreased.  Hemoglobin A1c is 6.  Most recent LDL was less than 30.  He denies orthopnea and PND.  No palpitations or syncope.  Past Medical History:  Diagnosis Date   Arthritis    CAD (coronary artery disease)    a. 04/27/14 DES to [facility]; widely patent L coronary system b. cath 08/13/2015 no changes, patent RCA   Chronic lower back pain    Depression    Gout    History of echocardiogram    a. 04/27/14 with EF 65-70% and mild TR.   History of pancreatitis    Hypertension    Metabolic syndrome    Mixed hyperlipidemia    Obesity (BMI 30-39.9) 02/16/2016   OSA (obstructive sleep apnea) 11/26/2015   Severe with total AHI 104.8/hr and oxygen desaturations as low as 79%.  On CPAP at 13cm H2O   Type 2 diabetes mellitus ([place])    on Insulin pump    Past Surgical History:  Procedure Laterality Date   ANTERIOR LAT LUMBAR FUSION Right 08/03/2020   Procedure: RIGHT LUMBAR THREE-LUMBAR FOUR LATERAL INTERBODY FUSION WITH INSTRUMENTATION AND ALLOGRAFT;  Surgeon: Phylliss Bob, MD;  Location: [place];  Service: Orthopedics;  Laterality: Right;  anterolateral   CARDIAC CATHETERIZATION N/A 08/14/2015   Procedure: Left Heart Cath and Coronary Angiography;  Surgeon: Belva Crome, MD;  Location: [place] [facility];  Service: Cardiovascular;  Laterality: N/A;   CARPAL TUNNEL RELEASE Bilateral 2006-2008   left 12/2004; right  10/2006   CHONDROPLASTY  04/20/2015   Procedure: CHONDROPLASTY;  Surgeon: Melrose Nakayama, MD;  Location: [place];  Service: Orthopedics;;   FUNCTIONAL ENDOSCOPIC SINUS SURGERY  10/2001; 08/2005;    KNEE ARTHROSCOPY Bilateral 2000's   right 08/2003;    KNEE ARTHROSCOPY WITH MEDIAL MENISECTOMY Left 04/20/2015   Procedure: LEFT ARTHROSCOPY KNEE WITH MEDIAL MENISECTOMY;  Surgeon: Melrose Nakayama, MD;  Location: [place];  Service: Orthopedics;  Laterality: Left;  Partial medial menisectomy, chondroplasty.   LEFT HEART CATHETERIZATION WITH CORONARY ANGIOGRAM N/A 04/27/2014   Procedure: LEFT HEART CATHETERIZATION WITH CORONARY ANGIOGRAM;  Surgeon: [name], MD;  Location: [facility] CATH LAB;  Service: Cardiovascular;  Laterality: N/A;   LUMBAR LAMINECTOMY Left 05/2006   PERCUTANEOUS STENT INTERVENTION  04/27/2014   Procedure: PERCUTANEOUS STENT INTERVENTION;  Surgeon: [name], MD;  Location: [facility] CATH LAB;  Service: Cardiovascular;;  DES x2 (distal +prox RCA)   POSTERIOR LUMBAR FUSION  07/2009   SHOULDER ARTHROSCOPY Right ~ 2010   TOTAL KNEE ARTHROPLASTY Left 05/28/2022   Procedure: LEFT TOTAL KNEE ARTHROPLASTY;  Surgeon: Melrose Nakayama, MD;  Location: [facility];  Service: Orthopedics;  Laterality: Left;    Current Medications: Current Meds  Medication Sig   allopurinol (ZYLOPRIM) 300 MG tablet Take 1 tablet by  mouth once daily   aspirin EC 81 MG tablet Take 1 tablet (81 mg total) by mouth 2 (two) times daily after a meal. For 2 weeks then back to once a day for dvt prevention   atorvastatin (LIPITOR) 80 MG tablet Take 1 tablet by mouth daily   cetirizine (ZYRTEC) 10 MG tablet Take 10 mg by mouth daily.   escitalopram (LEXAPRO) 20 MG tablet TAKE 1 TABLET BY MOUTH DAILY   Evolocumab (REPATHA SURECLICK) 102 MG/ML SOAJ Inject 140 mg into the skin every 14 (fourteen) days.   fenofibrate 160 MG tablet Take 1 tablet (160 mg total) by mouth daily.   glucose blood  (ACCU-CHEK GUIDE) test strip Check blood sugars 10 times a day as directed   HYDROcodone-acetaminophen (NORCO) 10-325 MG tablet Take 1 tablet by mouth 4 (four) times daily as needed for pain.   icosapent Ethyl (VASCEPA) 1 g capsule TAKE 2 CAPSULES BY MOUTH TWICE DAILY   insulin regular human CONCENTRATED (HUMULIN R) 500 UNIT/ML injection USE IN INSULIN PUMP AS DIRECTED. USE 30 UNIT MARKINGS (150 UNITS) 3 TIMES A DAY.   Lysine 500 MG TABS Take 500 mg by mouth daily.   metoprolol succinate (TOPROL-XL) 25 MG 24 hr tablet TAKE 1 TABLET BY MOUTH DAILY   montelukast (SINGULAIR) 10 MG tablet Take 1 tablet by mouth daily as needed   nitroGLYCERIN (NITROSTAT) 0.4 MG SL tablet PLACE 1 TABLET (0.4 MG TOTAL) UNDER THE TONGUE EVERY 5 (FIVE) MINUTES X 3 DOSES AS NEEDED FOR CHEST PAIN. DIAL 911 AFTER 3 DOSES   ONE TOUCH ULTRA TEST test strip 1 each by Other route as needed for other (blood sugar).    pregabalin (LYRICA) 150 MG capsule Take 150 mg by mouth 2 (two) times daily.   ramipril (ALTACE) 10 MG capsule Take 1 capsule by mouth once daily   sildenafil (VIAGRA) 100 MG tablet TAKE 1 TABLET BY MOUTH ONCE DAILY AS NEEDED   tirzepatide (MOUNJARO) 15 MG/0.5ML Pen Inject 15 mg into the skin once a week.   valACYclovir (VALTREX) 1000 MG tablet TAKE 2 TABLETS BY MOUTH EVERY 12 HOURS FOR 24 HOURS AS NEEDED FOR OUTBREAK AS DIRECTED     Allergies:   Percocet [oxycodone-acetaminophen]   Social History   Socioeconomic History   Marital status: Married    Spouse name: Not on file   Number of children: 2   Years of education: Not on file   Highest education level: Not on file  Occupational History   Occupation: Actuary  Tobacco Use   Smoking status: Never    Passive exposure: Yes   Smokeless tobacco: Never   Tobacco comments:    tried for less than a year when 58  Vaping Use   Vaping Use: Never used  Substance and Sexual Activity   Alcohol use: Yes    Comment: occ   Drug use: No   Sexual  activity: Yes  Other Topics Concern   Not on file  Social History Narrative   Not on file   Social Determinants of Health   Financial Resource Strain: Not on file  Food Insecurity: Not on file  Transportation Needs: Not on file  Physical Activity: Not on file  Stress: Not on file  Social Connections: Not on file     Family History: The patient's family history includes Asthma in his son; Coronary artery disease in his mother; Diabetes in his father and mother; Emphysema in his mother; Heart attack in his mother; Lung cancer  in his father; Prostate cancer in his father. There is no history of Colon cancer.  ROS:   Please see the history of present illness.    Not using CPAP.  Does not snore as much.  All other systems reviewed and are negative.  EKGs/Labs/Other Studies Reviewed:    The following studies were reviewed today: No new imaging  EKG:  EKG not performed.  Most recent tracing from September 2023 reveals normal sinus rhythm with normal tracing.  Recent Labs: 05/21/2022: BUN 19; Creatinine, Ser 1.08; Hemoglobin 16.5; Platelets 221; Potassium 4.0; Sodium 140  Recent Lipid Panel    Component Value Date/Time   CHOL 349 (H) 10/13/2015 0858   TRIG 2,598 (H) 10/13/2015 0858   HDL 27 (L) 10/13/2015 0858   CHOLHDL 12.9 (H) 10/13/2015 0858   VLDL NOT CALC 10/13/2015 0858   LDLCALC NOT CALC 10/13/2015 0858   LDLDIRECT 65 10/13/2015 0858    Physical Exam:    VS:  BP 116/68   Pulse 73   Ht 5' 10.5" (1.791 m)   Wt 263 lb 9.6 oz (119.6 kg)   SpO2 97%   BMI 37.29 kg/m     Wt Readings from Last 3 Encounters:  08/06/22 263 lb 9.6 oz (119.6 kg)  05/28/22 271 lb (122.9 kg)  05/21/22 271 lb (122.9 kg)     GEN: Overweight but down about 20 pounds. No acute distress HEENT: Normal NECK: No JVD. LYMPHATICS: No lymphadenopathy CARDIAC: No murmur. RRR no gallop, or edema. VASCULAR:  Normal Pulses. No bruits. RESPIRATORY:  Clear to auscultation without rales, wheezing or  rhonchi  ABDOMEN: Soft, non-tender, non-distended, No pulsatile mass, MUSCULOSKELETAL: No deformity  SKIN: Warm and dry NEUROLOGIC:  Alert and oriented x 3 PSYCHIATRIC:  Normal affect   ASSESSMENT:    1. Coronary artery disease involving native coronary artery of native heart without angina pectoris   2. Essential hypertension   3. Hypercholesterolemia   4. OSA (obstructive sleep apnea)   5. Morbid obesity ([place])    PLAN:    In order of problems listed above:  Secondary prevention reviewed.  Encouraged aerobic activity. Excellent blood pressure control on current therapy.  We may find that Altace or beta-blocker dose can be further reduced if he continues losing weight. LDL target is less than 70.  Could likely decrease Lipitor to 40 mg/day. Similar continue sleep apnea therapy. Continue Mounjaro.   Overall education and awareness concerning primary/secondary risk prevention was discussed in detail: LDL less than 70, hemoglobin A1c less than 7, blood pressure target less than 130/80 mmHg, >150 minutes of moderate aerobic activity per week, avoidance of smoking, weight control (via diet and exercise), and continued surveillance/management of/for obstructive sleep apnea.    Medication Adjustments/Labs and Tests Ordered: Current medicines are reviewed at length with the patient today.  Concerns regarding medicines are outlined above.  No orders of the defined types were placed in this encounter.  No orders of the defined types were placed in this encounter.   Patient Instructions  Medication Instructions:  Your physician recommends that you continue on your current medications as directed. Please refer to the Current Medication list given to you today.  *If you need a refill on your cardiac medications before your next appointment, please call your pharmacy*  Follow-Up: At [facility], you and your health needs are our priority.  As part of our continuing mission to  provide you with exceptional heart care, we have created designated Provider Care Teams.  These Care Teams include your primary Cardiologist (physician) and Advanced Practice Providers (APPs -  Physician Assistants and Nurse Practitioners) who all work together to provide you with the care you need, when you need it.  Your next appointment:   1 year(s)  The format for your next appointment:   In Person  Provider:   Candee Furbish, MD  Important Information About Sugar         Signed, [name], MD  08/06/2022 3:54 PM    [place]

## 2022-08-05 ENCOUNTER — Other Ambulatory Visit (HOSPITAL_BASED_OUTPATIENT_CLINIC_OR_DEPARTMENT_OTHER): Payer: Self-pay

## 2022-08-05 MED ORDER — HYDROCODONE-ACETAMINOPHEN 10-325 MG PO TABS
1.0000 | ORAL_TABLET | Freq: Four times a day (QID) | ORAL | 0 refills | Status: DC | PRN
  Filled 2022-08-05: qty 120, 30d supply, fill #0

## 2022-08-06 ENCOUNTER — Ambulatory Visit: Payer: PRIVATE HEALTH INSURANCE | Attending: Interventional Cardiology | Admitting: Interventional Cardiology

## 2022-08-06 ENCOUNTER — Encounter: Payer: Self-pay | Admitting: Interventional Cardiology

## 2022-08-06 ENCOUNTER — Other Ambulatory Visit (HOSPITAL_BASED_OUTPATIENT_CLINIC_OR_DEPARTMENT_OTHER): Payer: Self-pay

## 2022-08-06 VITALS — BP 116/68 | HR 73 | Ht 70.5 in | Wt 263.6 lb

## 2022-08-06 DIAGNOSIS — E7800 Pure hypercholesterolemia, unspecified: Secondary | ICD-10-CM

## 2022-08-06 DIAGNOSIS — I10 Essential (primary) hypertension: Secondary | ICD-10-CM | POA: Diagnosis not present

## 2022-08-06 DIAGNOSIS — G4733 Obstructive sleep apnea (adult) (pediatric): Secondary | ICD-10-CM | POA: Diagnosis not present

## 2022-08-06 DIAGNOSIS — I2510 Atherosclerotic heart disease of native coronary artery without angina pectoris: Secondary | ICD-10-CM

## 2022-08-06 NOTE — Patient Instructions (Signed)
Medication Instructions:  Your physician recommends that you continue on your current medications as directed. Please refer to the Current Medication list given to you today.  *If you need a refill on your cardiac medications before your next appointment, please call your pharmacy*  Follow-Up: At [facility], you and your health needs are our priority.  As part of our continuing mission to provide you with exceptional heart care, we have created designated Provider Care Teams.  These Care Teams include your primary Cardiologist (physician) and Advanced Practice Providers (APPs -  Physician Assistants and Nurse Practitioners) who all work together to provide you with the care you need, when you need it.  Your next appointment:   1 year(s)  The format for your next appointment:   In Person  Provider:   Candee Furbish, MD  Important Information About Sugar

## 2022-08-13 ENCOUNTER — Other Ambulatory Visit (HOSPITAL_BASED_OUTPATIENT_CLINIC_OR_DEPARTMENT_OTHER): Payer: Self-pay

## 2022-08-13 MED ORDER — AMOXICILLIN-POT CLAVULANATE 875-125 MG PO TABS
1.0000 | ORAL_TABLET | Freq: Two times a day (BID) | ORAL | 0 refills | Status: AC
  Filled 2022-08-13: qty 14, 7d supply, fill #0

## 2022-08-15 ENCOUNTER — Other Ambulatory Visit (HOSPITAL_BASED_OUTPATIENT_CLINIC_OR_DEPARTMENT_OTHER): Payer: Self-pay

## 2022-08-26 ENCOUNTER — Other Ambulatory Visit (HOSPITAL_BASED_OUTPATIENT_CLINIC_OR_DEPARTMENT_OTHER): Payer: Self-pay

## 2022-08-26 MED ORDER — PREGABALIN 50 MG PO CAPS
50.0000 mg | ORAL_CAPSULE | Freq: Two times a day (BID) | ORAL | 5 refills | Status: DC
  Filled 2022-08-26: qty 60, 30d supply, fill #0
  Filled 2022-10-03: qty 60, 30d supply, fill #1
  Filled 2022-11-01: qty 60, 30d supply, fill #2
  Filled 2022-12-27: qty 60, 30d supply, fill #3
  Filled 2023-03-25: qty 60, 30d supply, fill #4
  Filled 2023-05-28: qty 60, 30d supply, fill #5

## 2022-08-26 MED ORDER — METOPROLOL SUCCINATE ER 25 MG PO TB24
25.0000 mg | ORAL_TABLET | Freq: Every day | ORAL | 3 refills | Status: DC
  Filled 2022-08-26: qty 90, 90d supply, fill #0
  Filled 2022-12-27: qty 90, 90d supply, fill #1
  Filled 2023-03-25: qty 90, 90d supply, fill #2
  Filled 2023-06-23: qty 90, 90d supply, fill #3

## 2022-08-26 MED ORDER — SILDENAFIL CITRATE 100 MG PO TABS
100.0000 mg | ORAL_TABLET | Freq: Every day | ORAL | 5 refills | Status: AC | PRN
  Filled 2022-08-26: qty 30, 30d supply, fill #0

## 2022-08-28 ENCOUNTER — Other Ambulatory Visit (HOSPITAL_BASED_OUTPATIENT_CLINIC_OR_DEPARTMENT_OTHER): Payer: Self-pay

## 2022-09-03 ENCOUNTER — Other Ambulatory Visit (HOSPITAL_BASED_OUTPATIENT_CLINIC_OR_DEPARTMENT_OTHER): Payer: Self-pay

## 2022-09-03 MED ORDER — HYDROCODONE-ACETAMINOPHEN 10-325 MG PO TABS
1.0000 | ORAL_TABLET | Freq: Four times a day (QID) | ORAL | 0 refills | Status: DC | PRN
  Filled 2022-09-03: qty 120, 30d supply, fill #0

## 2022-09-17 ENCOUNTER — Other Ambulatory Visit (HOSPITAL_BASED_OUTPATIENT_CLINIC_OR_DEPARTMENT_OTHER): Payer: Self-pay

## 2022-09-17 ENCOUNTER — Other Ambulatory Visit: Payer: Self-pay

## 2022-10-03 ENCOUNTER — Other Ambulatory Visit: Payer: Self-pay

## 2022-10-03 ENCOUNTER — Other Ambulatory Visit (HOSPITAL_BASED_OUTPATIENT_CLINIC_OR_DEPARTMENT_OTHER): Payer: Self-pay

## 2022-10-03 MED ORDER — HYDROCODONE-ACETAMINOPHEN 10-325 MG PO TABS
1.0000 | ORAL_TABLET | Freq: Four times a day (QID) | ORAL | 0 refills | Status: DC | PRN
  Filled 2022-10-03: qty 120, 30d supply, fill #0

## 2022-10-09 ENCOUNTER — Other Ambulatory Visit (HOSPITAL_BASED_OUTPATIENT_CLINIC_OR_DEPARTMENT_OTHER): Payer: Self-pay

## 2022-10-10 ENCOUNTER — Other Ambulatory Visit (HOSPITAL_BASED_OUTPATIENT_CLINIC_OR_DEPARTMENT_OTHER): Payer: Self-pay

## 2022-10-10 MED ORDER — ICOSAPENT ETHYL 1 G PO CAPS
2.0000 g | ORAL_CAPSULE | Freq: Two times a day (BID) | ORAL | 12 refills | Status: DC
  Filled 2022-10-10: qty 120, 30d supply, fill #0
  Filled 2022-11-01 – 2022-11-04 (×2): qty 120, 30d supply, fill #1
  Filled 2022-12-27: qty 120, 30d supply, fill #2
  Filled 2023-03-25: qty 120, 30d supply, fill #3
  Filled 2023-05-28: qty 120, 30d supply, fill #4
  Filled 2023-06-23: qty 120, 30d supply, fill #5
  Filled 2023-10-09: qty 120, 30d supply, fill #6

## 2022-10-11 ENCOUNTER — Other Ambulatory Visit (HOSPITAL_COMMUNITY): Payer: Self-pay

## 2022-10-11 ENCOUNTER — Other Ambulatory Visit (HOSPITAL_BASED_OUTPATIENT_CLINIC_OR_DEPARTMENT_OTHER): Payer: Self-pay

## 2022-10-15 ENCOUNTER — Other Ambulatory Visit (HOSPITAL_BASED_OUTPATIENT_CLINIC_OR_DEPARTMENT_OTHER): Payer: Self-pay

## 2022-10-31 ENCOUNTER — Other Ambulatory Visit (HOSPITAL_BASED_OUTPATIENT_CLINIC_OR_DEPARTMENT_OTHER): Payer: Self-pay

## 2022-11-01 ENCOUNTER — Other Ambulatory Visit (HOSPITAL_BASED_OUTPATIENT_CLINIC_OR_DEPARTMENT_OTHER): Payer: Self-pay

## 2022-11-01 MED ORDER — HYDROCODONE-ACETAMINOPHEN 10-325 MG PO TABS
1.0000 | ORAL_TABLET | Freq: Four times a day (QID) | ORAL | 0 refills | Status: DC | PRN
  Filled 2022-11-01: qty 120, 30d supply, fill #0

## 2022-11-04 ENCOUNTER — Other Ambulatory Visit: Payer: Self-pay

## 2022-11-08 ENCOUNTER — Other Ambulatory Visit (HOSPITAL_BASED_OUTPATIENT_CLINIC_OR_DEPARTMENT_OTHER): Payer: Self-pay

## 2022-11-21 ENCOUNTER — Other Ambulatory Visit (HOSPITAL_COMMUNITY): Payer: Self-pay

## 2022-11-21 ENCOUNTER — Other Ambulatory Visit (HOSPITAL_BASED_OUTPATIENT_CLINIC_OR_DEPARTMENT_OTHER): Payer: Self-pay

## 2022-11-21 MED ORDER — RAMIPRIL 10 MG PO CAPS
10.0000 mg | ORAL_CAPSULE | Freq: Every day | ORAL | 4 refills | Status: DC
  Filled 2022-11-21 – 2022-11-26 (×4): qty 90, 90d supply, fill #0
  Filled 2023-03-25: qty 90, 90d supply, fill #1
  Filled 2023-06-23: qty 90, 90d supply, fill #2
  Filled 2023-10-09: qty 90, 90d supply, fill #3

## 2022-11-21 MED ORDER — MONTELUKAST SODIUM 10 MG PO TABS
10.0000 mg | ORAL_TABLET | Freq: Every day | ORAL | 3 refills | Status: DC
  Filled 2022-11-21: qty 90, 90d supply, fill #0

## 2022-11-21 MED ORDER — MOUNJARO 2.5 MG/0.5ML ~~LOC~~ SOAJ
2.5000 mg | SUBCUTANEOUS | 6 refills | Status: AC
  Filled 2022-11-21: qty 2, 28d supply, fill #0

## 2022-11-25 ENCOUNTER — Other Ambulatory Visit (HOSPITAL_BASED_OUTPATIENT_CLINIC_OR_DEPARTMENT_OTHER): Payer: Self-pay

## 2022-11-26 ENCOUNTER — Other Ambulatory Visit (HOSPITAL_COMMUNITY): Payer: Self-pay

## 2022-11-26 ENCOUNTER — Other Ambulatory Visit (HOSPITAL_BASED_OUTPATIENT_CLINIC_OR_DEPARTMENT_OTHER): Payer: Self-pay

## 2022-11-27 ENCOUNTER — Other Ambulatory Visit (HOSPITAL_BASED_OUTPATIENT_CLINIC_OR_DEPARTMENT_OTHER): Payer: Self-pay

## 2022-11-27 MED ORDER — FLUTICASONE PROPIONATE 50 MCG/ACT NA SUSP
1.0000 | Freq: Two times a day (BID) | NASAL | 0 refills | Status: AC | PRN
  Filled 2022-11-27: qty 16, 30d supply, fill #0

## 2022-11-28 ENCOUNTER — Other Ambulatory Visit (HOSPITAL_BASED_OUTPATIENT_CLINIC_OR_DEPARTMENT_OTHER): Payer: Self-pay

## 2022-11-28 MED ORDER — CEFDINIR 300 MG PO CAPS
300.0000 mg | ORAL_CAPSULE | Freq: Two times a day (BID) | ORAL | 0 refills | Status: AC
  Filled 2022-11-28: qty 14, 7d supply, fill #0

## 2022-12-04 ENCOUNTER — Other Ambulatory Visit (HOSPITAL_BASED_OUTPATIENT_CLINIC_OR_DEPARTMENT_OTHER): Payer: Self-pay

## 2022-12-04 MED ORDER — MOUNJARO 15 MG/0.5ML ~~LOC~~ SOAJ
15.0000 mg | SUBCUTANEOUS | 6 refills | Status: DC
  Filled 2022-12-04: qty 2, 28d supply, fill #0
  Filled 2023-01-15: qty 2, 28d supply, fill #1
  Filled 2023-04-30: qty 2, 28d supply, fill #2
  Filled 2023-05-29: qty 2, 28d supply, fill #3
  Filled 2023-06-23: qty 2, 28d supply, fill #4
  Filled 2023-07-23: qty 2, 28d supply, fill #5
  Filled 2023-08-22: qty 2, 28d supply, fill #6
  Filled 2023-09-12: qty 2, 28d supply, fill #7
  Filled 2023-10-13: qty 2, 28d supply, fill #8
  Filled 2023-11-24: qty 2, 28d supply, fill #9

## 2022-12-04 MED ORDER — HYDROCODONE-ACETAMINOPHEN 10-325 MG PO TABS
1.0000 | ORAL_TABLET | Freq: Four times a day (QID) | ORAL | 0 refills | Status: DC | PRN
  Filled 2022-12-04: qty 120, 30d supply, fill #0

## 2022-12-06 ENCOUNTER — Other Ambulatory Visit (HOSPITAL_COMMUNITY): Payer: Self-pay

## 2022-12-06 ENCOUNTER — Other Ambulatory Visit (HOSPITAL_BASED_OUTPATIENT_CLINIC_OR_DEPARTMENT_OTHER): Payer: Self-pay

## 2022-12-09 ENCOUNTER — Other Ambulatory Visit (HOSPITAL_BASED_OUTPATIENT_CLINIC_OR_DEPARTMENT_OTHER): Payer: Self-pay

## 2022-12-11 ENCOUNTER — Other Ambulatory Visit (HOSPITAL_BASED_OUTPATIENT_CLINIC_OR_DEPARTMENT_OTHER): Payer: Self-pay

## 2022-12-16 ENCOUNTER — Other Ambulatory Visit (HOSPITAL_BASED_OUTPATIENT_CLINIC_OR_DEPARTMENT_OTHER): Payer: Self-pay

## 2022-12-17 ENCOUNTER — Other Ambulatory Visit (HOSPITAL_BASED_OUTPATIENT_CLINIC_OR_DEPARTMENT_OTHER): Payer: Self-pay

## 2022-12-20 ENCOUNTER — Other Ambulatory Visit (HOSPITAL_BASED_OUTPATIENT_CLINIC_OR_DEPARTMENT_OTHER): Payer: Self-pay

## 2022-12-25 ENCOUNTER — Other Ambulatory Visit (HOSPITAL_BASED_OUTPATIENT_CLINIC_OR_DEPARTMENT_OTHER): Payer: Self-pay

## 2022-12-25 MED ORDER — MOUNJARO 5 MG/0.5ML ~~LOC~~ SOAJ
5.0000 mg | SUBCUTANEOUS | 6 refills | Status: AC
  Filled 2022-12-25: qty 2, 28d supply, fill #0
  Filled 2023-05-28: qty 2, 28d supply, fill #1

## 2022-12-27 ENCOUNTER — Other Ambulatory Visit (HOSPITAL_BASED_OUTPATIENT_CLINIC_OR_DEPARTMENT_OTHER): Payer: Self-pay

## 2022-12-27 MED ORDER — ALLOPURINOL 300 MG PO TABS
300.0000 mg | ORAL_TABLET | Freq: Every day | ORAL | 3 refills | Status: DC
  Filled 2022-12-27: qty 90, 90d supply, fill #0
  Filled 2023-03-25: qty 90, 90d supply, fill #1
  Filled 2023-06-23: qty 90, 90d supply, fill #2
  Filled 2023-10-09: qty 90, 90d supply, fill #3

## 2022-12-30 ENCOUNTER — Other Ambulatory Visit: Payer: Self-pay

## 2022-12-30 ENCOUNTER — Other Ambulatory Visit (HOSPITAL_BASED_OUTPATIENT_CLINIC_OR_DEPARTMENT_OTHER): Payer: Self-pay

## 2022-12-31 ENCOUNTER — Other Ambulatory Visit (HOSPITAL_BASED_OUTPATIENT_CLINIC_OR_DEPARTMENT_OTHER): Payer: Self-pay

## 2023-01-02 ENCOUNTER — Other Ambulatory Visit (HOSPITAL_BASED_OUTPATIENT_CLINIC_OR_DEPARTMENT_OTHER): Payer: Self-pay

## 2023-01-02 MED ORDER — HYDROCODONE-ACETAMINOPHEN 10-325 MG PO TABS
1.0000 | ORAL_TABLET | Freq: Four times a day (QID) | ORAL | 0 refills | Status: DC | PRN
  Filled 2023-01-02: qty 120, 30d supply, fill #0

## 2023-01-10 ENCOUNTER — Encounter (HOSPITAL_BASED_OUTPATIENT_CLINIC_OR_DEPARTMENT_OTHER): Payer: Self-pay | Admitting: Urology

## 2023-01-10 ENCOUNTER — Other Ambulatory Visit: Payer: Self-pay

## 2023-01-10 ENCOUNTER — Emergency Department (HOSPITAL_BASED_OUTPATIENT_CLINIC_OR_DEPARTMENT_OTHER)
Admission: EM | Admit: 2023-01-10 | Discharge: 2023-01-10 | Disposition: A | Discharge status: Home / Self Care | Payer: PRIVATE HEALTH INSURANCE | Attending: Emergency Medicine | Admitting: Emergency Medicine

## 2023-01-10 ENCOUNTER — Emergency Department (HOSPITAL_BASED_OUTPATIENT_CLINIC_OR_DEPARTMENT_OTHER): Payer: PRIVATE HEALTH INSURANCE

## 2023-01-10 DIAGNOSIS — R0781 Pleurodynia: Secondary | ICD-10-CM

## 2023-01-10 DIAGNOSIS — Z7982 Long term (current) use of aspirin: Secondary | ICD-10-CM | POA: Insufficient documentation

## 2023-01-10 MED ORDER — GABAPENTIN 300 MG PO CAPS: 300.0000 mg | ORAL_CAPSULE | Freq: Three times a day (TID) | ORAL | 0 refills | Status: AC | PRN

## 2023-01-10 MED ORDER — DOXYCYCLINE HYCLATE 100 MG PO CAPS: 100.0000 mg | ORAL_CAPSULE | Freq: Two times a day (BID) | ORAL | 0 refills | Status: AC

## 2023-01-10 NOTE — ED Triage Notes (Signed)
Pt states went to move washtub in yard , fell onto log after seeing copperhead and injured right side ribs

## 2023-01-10 NOTE — Discharge Instructions (Addendum)
Although your x-ray did not clearly show a rib fracture, it is still possible that you do have a broken rib after your fall.  These injuries typically heal on their own over 4 to 6 weeks.  I gave you a prescription for doxycycline which is an antibiotic, as a "watch and wait prescription.  If you do develop fevers, or start having worsening cough, shortness of breath, or dark sputum, you can start taking this antibiotic.  We gave you an incentive spirometer in the ER.  This is an exercise prevent you from helping pneumonia.  He will take 10 slow breaths at a time, 10 times a day, for the next 10 days.  I would recommend taking Aleve or ibuprofen as needed, up to 3 times a day with meals.  This can also help with pain.

## 2023-01-10 NOTE — ED Provider Notes (Signed)
[facility]PART[facility] AT [facility] Provider Note   CSN: [id number] Arrival date & time: 01/10/23  1853     History  Chief Complaint  Patient presents with   Rib Injury    Alec [name] is a 62 y.o. male presenting to the ED with pain in his right ribs after a fall.  The patient reports he was out in the yard doing yard work today and was surprised by copperhead, and reports that he jerked back away from the snake, and lost his balance and fell and struck his right side on a log.  The snake did not bite him.  He is having pleuritic pain and pain with movement on the right wall of his chest.  Patient takes Norco 10 mg chronically for knee pain, as well as Lyrica.  HPI     Home Medications Prior to Admission medications   Medication Sig Start Date End Date Taking? Authorizing Provider  doxycycline (VIBRAMYCIN) 100 MG capsule Take 1 capsule (100 mg total) by mouth 2 (two) times daily for 7 days. For developing symptoms of pneumonia 01/10/23 01/17/23 Yes Karey Suthers, Kermit Balo, MD  gabapentin (NEURONTIN) 300 MG capsule Take 1 capsule (300 mg total) by mouth 3 (three) times daily as needed for up to 21 days. 01/10/23 01/31/23 Yes Onofre Gains, Kermit Balo, MD  allopurinol (ZYLOPRIM) 300 MG tablet Take 1 tablet (300 mg total) by mouth daily. 12/27/22     aspirin EC 81 MG tablet Take 1 tablet (81 mg total) by mouth 2 (two) times daily after a meal. For 2 weeks then back to once a day for dvt prevention 05/28/22   Elodia Florence, PA-C  atorvastatin (LIPITOR) 80 MG tablet Take 1 tablet by mouth daily 03/04/22     cefdinir (OMNICEF) 300 MG capsule Take 1 capsule (300 mg total) by mouth 2 (two) times daily for 7 days. 11/28/22     cetirizine (ZYRTEC) 10 MG tablet Take 10 mg by mouth daily.    [provider]  escitalopram (LEXAPRO) 20 MG tablet TAKE 1 TABLET BY MOUTH DAILY 02/07/22     Evolocumab (REPATHA SURECLICK) 140 MG/ML SOAJ Inject 140 mg into the skin every 14 (fourteen) days.  09/26/20   [provider]  fenofibrate 160 MG tablet Take 1 tablet (160 mg total) by mouth daily. 06/04/22   Lyn Records, MD  fluticasone (FLONASE) 50 MCG/ACT nasal spray Place 1 spray into both nostrils 2 (two) times daily as needed. 11/27/22     glucose blood (ACCU-CHEK GUIDE) test strip Check blood sugars 10 times a day as directed 05/13/22     HYDROcodone-acetaminophen (NORCO) 10-325 MG tablet Take 1 tablet by mouth 4 (four) times daily as needed for pain. 01/02/23     icosapent Ethyl (VASCEPA) 1 g capsule Take 2 capsules (2 g total) by mouth 2 (two) times daily. 10/10/22     insulin regular human CONCENTRATED (HUMULIN R) 500 UNIT/ML injection USE IN INSULIN PUMP AS DIRECTED. USE 30 UNIT MARKINGS (150 UNITS) 3 TIMES A DAY. 04/02/22     Lysine 500 MG TABS Take 500 mg by mouth daily.    [provider]  metoprolol succinate (TOPROL-XL) 25 MG 24 hr tablet Take 1 tablet (25 mg total) by mouth daily. 08/26/22     montelukast (SINGULAIR) 10 MG tablet Take 1 tablet by mouth daily as needed 01/09/21     montelukast (SINGULAIR) 10 MG tablet Take 1 tablet (10 mg total) by mouth daily. 11/21/22  nitroGLYCERIN (NITROSTAT) 0.4 MG SL tablet PLACE 1 TABLET (0.4 MG TOTAL) UNDER THE TONGUE EVERY 5 (FIVE) MINUTES X 3 DOSES AS NEEDED FOR CHEST PAIN. DIAL 911 AFTER 3 DOSES 04/26/21   Lyn Records, MD  ONE TOUCH ULTRA TEST test strip 1 each by Other route as needed for other (blood sugar).  08/16/15   [provider]  pregabalin (LYRICA) 150 MG capsule Take 150 mg by mouth 2 (two) times daily.    [provider]  pregabalin (LYRICA) 50 MG capsule Take 1 capsule (50 mg total) by mouth 2 (two) times daily. 08/26/22     ramipril (ALTACE) 10 MG capsule Take 1 capsule (10 mg total) by mouth daily. 11/21/22     sildenafil (VIAGRA) 100 MG tablet Take 1 tablet (100 mg total) by mouth daily as needed. 08/26/22     tirzepatide (MOUNJARO) 15 MG/0.5ML Pen Inject 15 mg into the skin once a week.  12/04/22     tirzepatide (MOUNJARO) 2.5 MG/0.5ML Pen Inject 2.5 mg into the skin once a week. 11/21/22     tirzepatide (MOUNJARO) 5 MG/0.5ML Pen Inject 5 mg into the skin once a week. 12/25/22     valACYclovir (VALTREX) 1000 MG tablet TAKE 2 TABLETS BY MOUTH EVERY 12 HOURS FOR 24 HOURS AS NEEDED FOR OUTBREAK AS DIRECTED 04/23/22         Allergies    Percocet [oxycodone-acetaminophen]    Review of Systems   Review of Systems  Physical Exam Updated Vital Signs BP (!) 151/90 (BP Location: Left Arm)   Pulse 88   Temp 98 F (36.7 C) (Oral)   Resp (!) 24   Ht 5\' 11"  (1.803 m)   Wt 119.6 kg   SpO2 97%   BMI 36.77 kg/m  Physical Exam Constitutional:      General: He is not in acute distress. HENT:     Head: Normocephalic and atraumatic.  Eyes:     Conjunctiva/sclera: Conjunctivae normal.     Pupils: Pupils are equal, round, and reactive to light.  Cardiovascular:     Rate and Rhythm: Normal rate and regular rhythm.     Comments: Right lower chest wall tenderness Pulmonary:     Effort: Pulmonary effort is normal. No respiratory distress.  Abdominal:     General: There is no distension.     Tenderness: There is no abdominal tenderness.  Skin:    General: Skin is warm and dry.  Neurological:     General: No focal deficit present.     Mental Status: He is alert. Mental status is at baseline.  Psychiatric:        Mood and Affect: Mood normal.        Behavior: Behavior normal.     ED Results / Procedures / Treatments   Labs (all labs ordered are listed, but only abnormal results are displayed) Labs Reviewed - No data to display  EKG None  Radiology DG Ribs Unilateral W/Chest Right  Result Date: 01/10/2023 CLINICAL DATA:  Right rib pain after a fall. EXAM: RIGHT RIBS AND CHEST - 3+ VIEW COMPARISON:  08/11/2015 FINDINGS: Heart size and pulmonary vascularity are normal. Linear atelectasis or infiltration in the right lung base. Left lung is clear. No pleural effusions. No  pneumothorax. Mediastinal contours appear intact. Right ribs appear intact. No acute displaced fractures are identified. No focal bone lesion or bone destruction. Bone cortex appears intact. Soft tissues are unremarkable. IMPRESSION: 1. Linear atelectasis or infiltration in the right  lung base. 2. Negative right ribs. Electronically Signed   By: Burman Nieves M.D.   On: 01/10/2023 19:59    Procedures Procedures    Medications Ordered in ED Medications - No data to display  ED Course/ Medical Decision Making/ A&P                             Medical Decision Making Amount and/or Complexity of Data Reviewed Radiology: ordered.   Patient is presenting with complaint of right lower chest wall tenderness and pain with movement and inspiration after fall and injury.  Differential include rib fracture versus muscle contusion versus pulmonary contusion versus traumatic pneumothorax versus other.  I personally reviewed and interpreted patient's x-rays.  I do not see evidence of large displaced fracture or pneumothorax on his x-ray imaging.  There is question about atelectasis versus small lower lobe infiltrate but he does not have any signs or symptoms of pneumonia, and is not requiring antibiotics at this time.  We did discuss to watch and wait prescription at home.  I do still the high clinical concern for rib fracture, and so we discussed precautions including incentive spirometer use at home, follow-up with PCP.  We can try gabapentin as additional pain medication at home as a short course.  As well as NSAIDs.  We discussed nonbinding the chest.  We discussed a watch and wait prescription for doxycycline if he were develop fevers or productive cough.  He verbalized understanding.        Final Clinical Impression(s) / ED Diagnoses Final diagnoses:  Rib pain on right side    Rx / DC Orders ED Discharge Orders          Ordered    doxycycline (VIBRAMYCIN) 100 MG capsule  2 times daily         01/10/23 2207    gabapentin (NEURONTIN) 300 MG capsule  3 times daily PRN        01/10/23 2207              Terald Sleeper, MD 01/10/23 2208

## 2023-01-14 ENCOUNTER — Other Ambulatory Visit (HOSPITAL_BASED_OUTPATIENT_CLINIC_OR_DEPARTMENT_OTHER): Payer: Self-pay

## 2023-01-14 MED ORDER — NITROGLYCERIN 0.4 MG SL SUBL
SUBLINGUAL_TABLET | SUBLINGUAL | 3 refills | Status: DC
  Filled 2023-01-14: qty 25, 25d supply, fill #0

## 2023-01-15 ENCOUNTER — Other Ambulatory Visit (HOSPITAL_BASED_OUTPATIENT_CLINIC_OR_DEPARTMENT_OTHER): Payer: Self-pay

## 2023-01-17 ENCOUNTER — Other Ambulatory Visit (HOSPITAL_BASED_OUTPATIENT_CLINIC_OR_DEPARTMENT_OTHER): Payer: Self-pay

## 2023-01-31 ENCOUNTER — Other Ambulatory Visit (HOSPITAL_BASED_OUTPATIENT_CLINIC_OR_DEPARTMENT_OTHER): Payer: Self-pay

## 2023-01-31 MED ORDER — HYDROCODONE-ACETAMINOPHEN 10-325 MG PO TABS
1.0000 | ORAL_TABLET | Freq: Four times a day (QID) | ORAL | 0 refills | Status: DC | PRN
  Filled 2023-01-31: qty 120, 30d supply, fill #0

## 2023-03-05 ENCOUNTER — Other Ambulatory Visit (HOSPITAL_BASED_OUTPATIENT_CLINIC_OR_DEPARTMENT_OTHER): Payer: Self-pay

## 2023-03-05 MED ORDER — HYDROCODONE-ACETAMINOPHEN 10-325 MG PO TABS
1.0000 | ORAL_TABLET | Freq: Four times a day (QID) | ORAL | 0 refills | Status: AC | PRN
  Filled 2023-03-05: qty 120, 30d supply, fill #0

## 2023-03-25 ENCOUNTER — Other Ambulatory Visit: Payer: Self-pay

## 2023-03-25 ENCOUNTER — Other Ambulatory Visit (HOSPITAL_BASED_OUTPATIENT_CLINIC_OR_DEPARTMENT_OTHER): Payer: Self-pay

## 2023-03-25 MED ORDER — ATORVASTATIN CALCIUM 80 MG PO TABS
80.0000 mg | ORAL_TABLET | Freq: Every day | ORAL | 4 refills | Status: AC
  Filled 2023-03-25: qty 90, 90d supply, fill #0
  Filled 2023-06-23: qty 90, 90d supply, fill #1
  Filled 2023-10-09: qty 90, 90d supply, fill #2
  Filled 2024-01-05: qty 90, 90d supply, fill #3

## 2023-03-25 MED ORDER — ESCITALOPRAM OXALATE 20 MG PO TABS
20.0000 mg | ORAL_TABLET | Freq: Every day | ORAL | 12 refills | Status: DC
  Filled 2023-03-25: qty 90, 90d supply, fill #0
  Filled 2023-06-23: qty 90, 90d supply, fill #1
  Filled 2023-10-09: qty 90, 90d supply, fill #2
  Filled 2024-01-05: qty 90, 90d supply, fill #3

## 2023-03-25 MED ORDER — NITROGLYCERIN 0.4 MG SL SUBL
0.4000 mg | SUBLINGUAL_TABLET | SUBLINGUAL | 5 refills | Status: AC | PRN
  Filled 2023-03-25: qty 25, 10d supply, fill #0

## 2023-04-01 ENCOUNTER — Other Ambulatory Visit (HOSPITAL_BASED_OUTPATIENT_CLINIC_OR_DEPARTMENT_OTHER): Payer: Self-pay

## 2023-04-01 MED ORDER — HYDROCODONE-ACETAMINOPHEN 10-325 MG PO TABS
1.0000 | ORAL_TABLET | Freq: Four times a day (QID) | ORAL | 0 refills | Status: AC | PRN
  Filled 2023-04-02 – 2023-04-03 (×4): qty 120, 30d supply, fill #0

## 2023-04-02 ENCOUNTER — Other Ambulatory Visit (HOSPITAL_BASED_OUTPATIENT_CLINIC_OR_DEPARTMENT_OTHER): Payer: Self-pay

## 2023-04-03 ENCOUNTER — Other Ambulatory Visit (HOSPITAL_BASED_OUTPATIENT_CLINIC_OR_DEPARTMENT_OTHER): Payer: Self-pay

## 2023-04-30 ENCOUNTER — Other Ambulatory Visit (HOSPITAL_BASED_OUTPATIENT_CLINIC_OR_DEPARTMENT_OTHER): Payer: Self-pay

## 2023-05-05 ENCOUNTER — Other Ambulatory Visit (HOSPITAL_BASED_OUTPATIENT_CLINIC_OR_DEPARTMENT_OTHER): Payer: Self-pay

## 2023-05-05 MED ORDER — HYDROCODONE-ACETAMINOPHEN 10-325 MG PO TABS
1.0000 | ORAL_TABLET | Freq: Four times a day (QID) | ORAL | 0 refills | Status: AC | PRN
  Filled 2023-05-05: qty 120, 30d supply, fill #0

## 2023-05-28 ENCOUNTER — Other Ambulatory Visit: Payer: Self-pay

## 2023-05-28 ENCOUNTER — Other Ambulatory Visit (HOSPITAL_BASED_OUTPATIENT_CLINIC_OR_DEPARTMENT_OTHER): Payer: Self-pay

## 2023-05-28 MED ORDER — HUMULIN R U-500 (CONCENTRATED) 500 UNIT/ML ~~LOC~~ SOLN
SUBCUTANEOUS | 12 refills | Status: AC
  Filled 2023-05-28: qty 20, 23d supply, fill #0
  Filled 2023-06-23: qty 20, 23d supply, fill #1
  Filled 2023-07-23: qty 20, 23d supply, fill #2
  Filled 2023-10-09: qty 20, 23d supply, fill #3
  Filled 2023-11-24: qty 20, 23d supply, fill #4
  Filled 2024-01-05: qty 20, 23d supply, fill #5
  Filled 2024-01-28: qty 20, 23d supply, fill #6
  Filled 2024-02-02 – 2024-03-31 (×4): qty 20, 23d supply, fill #7
  Filled 2024-04-29: qty 20, 23d supply, fill #8

## 2023-05-29 ENCOUNTER — Other Ambulatory Visit (HOSPITAL_BASED_OUTPATIENT_CLINIC_OR_DEPARTMENT_OTHER): Payer: Self-pay

## 2023-06-01 ENCOUNTER — Other Ambulatory Visit (HOSPITAL_BASED_OUTPATIENT_CLINIC_OR_DEPARTMENT_OTHER): Payer: Self-pay

## 2023-06-02 ENCOUNTER — Other Ambulatory Visit (HOSPITAL_BASED_OUTPATIENT_CLINIC_OR_DEPARTMENT_OTHER): Payer: Self-pay

## 2023-06-02 MED ORDER — VALACYCLOVIR HCL 1 G PO TABS
2000.0000 mg | ORAL_TABLET | Freq: Two times a day (BID) | ORAL | 12 refills | Status: AC | PRN
  Filled 2023-06-02: qty 30, 15d supply, fill #0
  Filled 2023-08-25: qty 30, 15d supply, fill #1
  Filled 2024-02-24: qty 30, 15d supply, fill #2

## 2023-06-04 ENCOUNTER — Other Ambulatory Visit (HOSPITAL_BASED_OUTPATIENT_CLINIC_OR_DEPARTMENT_OTHER): Payer: Self-pay

## 2023-06-04 MED ORDER — FLULAVAL 0.5 ML IM SUSY
PREFILLED_SYRINGE | INTRAMUSCULAR | 0 refills | Status: AC
  Filled 2023-06-04: qty 0.5, 1d supply, fill #0

## 2023-06-04 MED ORDER — COMIRNATY 30 MCG/0.3ML IM SUSY
PREFILLED_SYRINGE | INTRAMUSCULAR | 0 refills | Status: AC
  Filled 2023-06-04: qty 0.3, 1d supply, fill #0

## 2023-06-04 MED ORDER — HYDROCODONE-ACETAMINOPHEN 10-325 MG PO TABS
1.0000 | ORAL_TABLET | Freq: Four times a day (QID) | ORAL | 0 refills | Status: AC | PRN
  Filled 2023-06-04: qty 120, 30d supply, fill #0

## 2023-06-23 ENCOUNTER — Other Ambulatory Visit: Payer: Self-pay | Admitting: Interventional Cardiology

## 2023-06-23 ENCOUNTER — Other Ambulatory Visit: Payer: Self-pay

## 2023-06-23 ENCOUNTER — Other Ambulatory Visit (HOSPITAL_BASED_OUTPATIENT_CLINIC_OR_DEPARTMENT_OTHER): Payer: Self-pay

## 2023-06-24 ENCOUNTER — Other Ambulatory Visit (HOSPITAL_BASED_OUTPATIENT_CLINIC_OR_DEPARTMENT_OTHER): Payer: Self-pay

## 2023-06-24 MED ORDER — PREGABALIN 50 MG PO CAPS
50.0000 mg | ORAL_CAPSULE | Freq: Two times a day (BID) | ORAL | 5 refills | Status: DC
  Filled 2023-06-24 – 2023-06-26 (×2): qty 60, 30d supply, fill #0
  Filled 2023-10-09: qty 60, 30d supply, fill #1
  Filled 2023-12-23: qty 60, 30d supply, fill #2
  Filled 2024-01-20: qty 60, 30d supply, fill #3
  Filled 2024-06-02: qty 60, 30d supply, fill #4

## 2023-06-25 ENCOUNTER — Other Ambulatory Visit (HOSPITAL_BASED_OUTPATIENT_CLINIC_OR_DEPARTMENT_OTHER): Payer: Self-pay

## 2023-06-25 MED ORDER — FENOFIBRATE 160 MG PO TABS
160.0000 mg | ORAL_TABLET | Freq: Every day | ORAL | 0 refills | Status: DC
  Filled 2023-06-25: qty 30, 30d supply, fill #0

## 2023-06-26 ENCOUNTER — Other Ambulatory Visit (HOSPITAL_BASED_OUTPATIENT_CLINIC_OR_DEPARTMENT_OTHER): Payer: Self-pay

## 2023-06-26 ENCOUNTER — Other Ambulatory Visit: Payer: Self-pay

## 2023-07-03 ENCOUNTER — Other Ambulatory Visit (HOSPITAL_BASED_OUTPATIENT_CLINIC_OR_DEPARTMENT_OTHER): Payer: Self-pay

## 2023-07-03 MED ORDER — HYDROCODONE-ACETAMINOPHEN 10-325 MG PO TABS
1.0000 | ORAL_TABLET | Freq: Four times a day (QID) | ORAL | 0 refills | Status: AC | PRN
  Filled 2023-07-03: qty 120, 30d supply, fill #0

## 2023-07-23 ENCOUNTER — Other Ambulatory Visit: Payer: Self-pay | Admitting: Nurse Practitioner

## 2023-07-23 ENCOUNTER — Other Ambulatory Visit (HOSPITAL_BASED_OUTPATIENT_CLINIC_OR_DEPARTMENT_OTHER): Payer: Self-pay

## 2023-07-23 MED ORDER — FENOFIBRATE 160 MG PO TABS
160.0000 mg | ORAL_TABLET | Freq: Every day | ORAL | 0 refills | Status: DC
  Filled 2023-07-23 – 2023-10-20 (×2): qty 15, 15d supply, fill #0

## 2023-07-24 ENCOUNTER — Other Ambulatory Visit (HOSPITAL_BASED_OUTPATIENT_CLINIC_OR_DEPARTMENT_OTHER): Payer: Self-pay

## 2023-07-24 ENCOUNTER — Other Ambulatory Visit: Payer: Self-pay

## 2023-07-30 ENCOUNTER — Other Ambulatory Visit (HOSPITAL_BASED_OUTPATIENT_CLINIC_OR_DEPARTMENT_OTHER): Payer: Self-pay

## 2023-07-30 MED ORDER — HYDROCODONE-ACETAMINOPHEN 10-325 MG PO TABS
1.0000 | ORAL_TABLET | Freq: Four times a day (QID) | ORAL | 0 refills | Status: DC | PRN
  Filled 2023-08-01: qty 120, 30d supply, fill #0

## 2023-07-30 MED ORDER — GEMTESA 75 MG PO TABS
75.0000 mg | ORAL_TABLET | Freq: Every day | ORAL | 4 refills | Status: DC
  Filled 2023-07-30: qty 30, 30d supply, fill #0
  Filled 2023-08-25: qty 30, 30d supply, fill #1
  Filled 2023-10-09: qty 30, 30d supply, fill #2
  Filled 2023-10-20 – 2023-11-24 (×2): qty 30, 30d supply, fill #3
  Filled 2024-01-05: qty 30, 30d supply, fill #4

## 2023-08-01 ENCOUNTER — Other Ambulatory Visit (HOSPITAL_BASED_OUTPATIENT_CLINIC_OR_DEPARTMENT_OTHER): Payer: Self-pay

## 2023-08-22 ENCOUNTER — Other Ambulatory Visit (HOSPITAL_BASED_OUTPATIENT_CLINIC_OR_DEPARTMENT_OTHER): Payer: Self-pay

## 2023-08-25 ENCOUNTER — Other Ambulatory Visit (HOSPITAL_BASED_OUTPATIENT_CLINIC_OR_DEPARTMENT_OTHER): Payer: Self-pay

## 2023-08-29 ENCOUNTER — Other Ambulatory Visit (HOSPITAL_BASED_OUTPATIENT_CLINIC_OR_DEPARTMENT_OTHER): Payer: Self-pay

## 2023-08-29 MED ORDER — HYDROCODONE-ACETAMINOPHEN 10-325 MG PO TABS
1.0000 | ORAL_TABLET | Freq: Four times a day (QID) | ORAL | 0 refills | Status: DC | PRN
  Filled 2023-08-29: qty 120, 30d supply, fill #0

## 2023-09-30 ENCOUNTER — Other Ambulatory Visit (HOSPITAL_BASED_OUTPATIENT_CLINIC_OR_DEPARTMENT_OTHER): Payer: Self-pay

## 2023-09-30 MED ORDER — HYDROCODONE-ACETAMINOPHEN 10-325 MG PO TABS
1.0000 | ORAL_TABLET | Freq: Four times a day (QID) | ORAL | 0 refills | Status: DC | PRN
  Filled 2023-09-30: qty 120, 30d supply, fill #0

## 2023-10-09 ENCOUNTER — Other Ambulatory Visit: Payer: Self-pay

## 2023-10-09 ENCOUNTER — Other Ambulatory Visit (HOSPITAL_BASED_OUTPATIENT_CLINIC_OR_DEPARTMENT_OTHER): Payer: Self-pay

## 2023-10-09 MED ORDER — METOPROLOL SUCCINATE ER 25 MG PO TB24
25.0000 mg | ORAL_TABLET | Freq: Every day | ORAL | 3 refills | Status: AC
  Filled 2023-10-09: qty 90, 90d supply, fill #0
  Filled 2024-01-20: qty 90, 90d supply, fill #1
  Filled 2024-04-29: qty 90, 90d supply, fill #2
  Filled 2024-09-30: qty 90, 90d supply, fill #3

## 2023-10-10 ENCOUNTER — Other Ambulatory Visit (HOSPITAL_BASED_OUTPATIENT_CLINIC_OR_DEPARTMENT_OTHER): Payer: Self-pay

## 2023-10-13 ENCOUNTER — Other Ambulatory Visit (HOSPITAL_BASED_OUTPATIENT_CLINIC_OR_DEPARTMENT_OTHER): Payer: Self-pay

## 2023-10-20 ENCOUNTER — Other Ambulatory Visit (HOSPITAL_BASED_OUTPATIENT_CLINIC_OR_DEPARTMENT_OTHER): Payer: Self-pay

## 2023-10-24 ENCOUNTER — Other Ambulatory Visit (HOSPITAL_BASED_OUTPATIENT_CLINIC_OR_DEPARTMENT_OTHER): Payer: Self-pay

## 2023-10-29 ENCOUNTER — Other Ambulatory Visit (HOSPITAL_BASED_OUTPATIENT_CLINIC_OR_DEPARTMENT_OTHER): Payer: Self-pay

## 2023-10-29 MED ORDER — FENOFIBRATE 145 MG PO TABS
145.0000 mg | ORAL_TABLET | Freq: Every day | ORAL | 3 refills | Status: AC
  Filled 2023-10-29: qty 30, 30d supply, fill #0
  Filled 2023-11-24: qty 90, 90d supply, fill #1
  Filled 2024-04-29: qty 90, 90d supply, fill #2
  Filled 2024-09-30: qty 90, 90d supply, fill #3

## 2023-10-29 MED ORDER — HYDROCODONE-ACETAMINOPHEN 10-325 MG PO TABS
1.0000 | ORAL_TABLET | Freq: Four times a day (QID) | ORAL | 0 refills | Status: DC | PRN
  Filled 2023-10-30: qty 120, 30d supply, fill #0

## 2023-10-30 ENCOUNTER — Other Ambulatory Visit (HOSPITAL_BASED_OUTPATIENT_CLINIC_OR_DEPARTMENT_OTHER): Payer: Self-pay

## 2023-11-24 ENCOUNTER — Other Ambulatory Visit: Payer: Self-pay

## 2023-11-24 ENCOUNTER — Other Ambulatory Visit (HOSPITAL_BASED_OUTPATIENT_CLINIC_OR_DEPARTMENT_OTHER): Payer: Self-pay

## 2023-11-24 MED ORDER — ICOSAPENT ETHYL 1 G PO CAPS
2.0000 g | ORAL_CAPSULE | Freq: Two times a day (BID) | ORAL | 12 refills | Status: AC
  Filled 2023-11-24: qty 120, 30d supply, fill #0
  Filled 2024-01-05: qty 120, 30d supply, fill #1
  Filled 2024-04-29 – 2024-09-30 (×2): qty 120, 30d supply, fill #2

## 2023-11-27 ENCOUNTER — Other Ambulatory Visit (HOSPITAL_BASED_OUTPATIENT_CLINIC_OR_DEPARTMENT_OTHER): Payer: Self-pay

## 2023-11-27 MED ORDER — GLUCAGON EMERGENCY 1 MG IJ KIT
PACK | INTRAMUSCULAR | 3 refills | Status: AC
  Filled 2023-11-27: qty 1, 30d supply, fill #0
  Filled 2024-02-02: qty 1, 30d supply, fill #1

## 2023-11-27 MED ORDER — HYDROCODONE-ACETAMINOPHEN 10-325 MG PO TABS
1.0000 | ORAL_TABLET | Freq: Four times a day (QID) | ORAL | 0 refills | Status: AC | PRN
  Filled 2023-11-27: qty 120, 30d supply, fill #0

## 2023-12-10 ENCOUNTER — Other Ambulatory Visit (HOSPITAL_BASED_OUTPATIENT_CLINIC_OR_DEPARTMENT_OTHER): Payer: Self-pay

## 2023-12-10 MED ORDER — MONTELUKAST SODIUM 10 MG PO TABS
10.0000 mg | ORAL_TABLET | Freq: Every day | ORAL | 3 refills | Status: AC
  Filled 2023-12-10: qty 30, 30d supply, fill #0

## 2023-12-23 ENCOUNTER — Other Ambulatory Visit (HOSPITAL_BASED_OUTPATIENT_CLINIC_OR_DEPARTMENT_OTHER): Payer: Self-pay

## 2023-12-23 MED ORDER — MOUNJARO 15 MG/0.5ML ~~LOC~~ SOAJ
15.0000 mg | SUBCUTANEOUS | 6 refills | Status: AC
  Filled 2023-12-23: qty 2, 28d supply, fill #0
  Filled 2024-01-23: qty 2, 28d supply, fill #1
  Filled 2024-02-24 – 2024-03-31 (×2): qty 2, 28d supply, fill #2
  Filled 2024-04-29: qty 2, 28d supply, fill #3
  Filled 2024-06-02: qty 2, 28d supply, fill #4
  Filled 2024-07-05: qty 2, 28d supply, fill #5
  Filled 2024-08-04: qty 2, 28d supply, fill #6
  Filled 2024-08-30: qty 2, 28d supply, fill #7
  Filled 2024-09-30: qty 2, 28d supply, fill #8

## 2023-12-24 ENCOUNTER — Other Ambulatory Visit (HOSPITAL_BASED_OUTPATIENT_CLINIC_OR_DEPARTMENT_OTHER): Payer: Self-pay

## 2023-12-25 ENCOUNTER — Other Ambulatory Visit (HOSPITAL_BASED_OUTPATIENT_CLINIC_OR_DEPARTMENT_OTHER): Payer: Self-pay

## 2023-12-26 ENCOUNTER — Other Ambulatory Visit (HOSPITAL_BASED_OUTPATIENT_CLINIC_OR_DEPARTMENT_OTHER): Payer: Self-pay

## 2023-12-29 ENCOUNTER — Other Ambulatory Visit (HOSPITAL_BASED_OUTPATIENT_CLINIC_OR_DEPARTMENT_OTHER): Payer: Self-pay

## 2023-12-30 ENCOUNTER — Other Ambulatory Visit (HOSPITAL_BASED_OUTPATIENT_CLINIC_OR_DEPARTMENT_OTHER): Payer: Self-pay

## 2023-12-30 MED ORDER — HYDROCODONE-ACETAMINOPHEN 10-325 MG PO TABS
ORAL_TABLET | ORAL | 0 refills | Status: AC
  Filled 2023-12-30: qty 120, 30d supply, fill #0

## 2023-12-31 ENCOUNTER — Other Ambulatory Visit (HOSPITAL_BASED_OUTPATIENT_CLINIC_OR_DEPARTMENT_OTHER): Payer: Self-pay

## 2024-01-01 ENCOUNTER — Other Ambulatory Visit (HOSPITAL_BASED_OUTPATIENT_CLINIC_OR_DEPARTMENT_OTHER): Payer: Self-pay

## 2024-01-02 ENCOUNTER — Other Ambulatory Visit (HOSPITAL_BASED_OUTPATIENT_CLINIC_OR_DEPARTMENT_OTHER): Payer: Self-pay

## 2024-01-05 ENCOUNTER — Other Ambulatory Visit (HOSPITAL_BASED_OUTPATIENT_CLINIC_OR_DEPARTMENT_OTHER): Payer: Self-pay

## 2024-01-05 ENCOUNTER — Other Ambulatory Visit: Payer: Self-pay | Admitting: Nurse Practitioner

## 2024-01-05 ENCOUNTER — Other Ambulatory Visit: Payer: Self-pay

## 2024-01-05 MED ORDER — RAMIPRIL 10 MG PO CAPS
10.0000 mg | ORAL_CAPSULE | Freq: Every day | ORAL | 4 refills | Status: AC
  Filled 2024-01-05: qty 90, 90d supply, fill #0
  Filled 2024-04-29: qty 90, 90d supply, fill #1

## 2024-01-05 MED ORDER — ALLOPURINOL 300 MG PO TABS
300.0000 mg | ORAL_TABLET | Freq: Every day | ORAL | 3 refills | Status: AC
  Filled 2024-01-05: qty 90, 90d supply, fill #0

## 2024-01-08 ENCOUNTER — Other Ambulatory Visit (HOSPITAL_BASED_OUTPATIENT_CLINIC_OR_DEPARTMENT_OTHER): Payer: Self-pay

## 2024-01-08 MED ORDER — FENOFIBRATE 160 MG PO TABS
160.0000 mg | ORAL_TABLET | Freq: Every day | ORAL | 0 refills | Status: AC
  Filled 2024-01-08: qty 30, 30d supply, fill #0

## 2024-01-20 ENCOUNTER — Other Ambulatory Visit (HOSPITAL_BASED_OUTPATIENT_CLINIC_OR_DEPARTMENT_OTHER): Payer: Self-pay

## 2024-01-21 ENCOUNTER — Ambulatory Visit: Payer: PRIVATE HEALTH INSURANCE | Attending: Cardiology | Admitting: Cardiology

## 2024-01-21 ENCOUNTER — Encounter: Payer: Self-pay | Admitting: Cardiology

## 2024-01-21 ENCOUNTER — Other Ambulatory Visit (HOSPITAL_BASED_OUTPATIENT_CLINIC_OR_DEPARTMENT_OTHER): Payer: Self-pay

## 2024-01-21 VITALS — BP 140/96 | HR 64 | Ht 71.0 in | Wt 267.2 lb

## 2024-01-21 DIAGNOSIS — I2510 Atherosclerotic heart disease of native coronary artery without angina pectoris: Secondary | ICD-10-CM

## 2024-01-21 DIAGNOSIS — E7800 Pure hypercholesterolemia, unspecified: Secondary | ICD-10-CM | POA: Diagnosis not present

## 2024-01-21 DIAGNOSIS — Z794 Long term (current) use of insulin: Secondary | ICD-10-CM

## 2024-01-21 DIAGNOSIS — I10 Essential (primary) hypertension: Secondary | ICD-10-CM

## 2024-01-21 DIAGNOSIS — E1159 Type 2 diabetes mellitus with other circulatory complications: Secondary | ICD-10-CM

## 2024-01-21 NOTE — Patient Instructions (Signed)
 Medication Instructions:  The current medical regimen is effective;  continue present plan and medications.  *If you need a refill on your cardiac medications before your next appointment, please call your pharmacy*  Follow-Up: At [facility], you and your health needs are our priority.  As part of our continuing mission to provide you with exceptional heart care, our providers are all part of one team.  This team includes your primary Cardiologist (physician) and Advanced Practice Providers or APPs (Physician Assistants and Nurse Practitioners) who all work together to provide you with the care you need, when you need it.  Your next appointment:   1 year(s)  Provider:   One of our Advanced Practice Providers (APPs): Melita Springer, PA-C  Friddie Jetty, NP Evaline Hill, NP  Theotis Flake, PA-C Lawana Pray, NP  Willis Harter, PA-C Lovette Rud, PA-C  [place], PA-C Ernest Dick, NP  Marlana Silvan, NP Marcie Sever, PA-C  Laquita Plant, PA-C    Dayna Dunn, PA-C  Scott Weaver, PA-C Palmer Bobo, NP Katlyn West, NP Callie Goodrich, PA-C  Evan Williams, PA-C Sheng Haley, PA-C  Xika Zhao, NP Kathleen Johnson, PA-C   Then, Dr Renna Cary will plan to see you again in 2 year(s).    We recommend signing up for the patient portal called "MyChart".  Sign up information is provided on this After Visit Summary.  MyChart is used to connect with patients for Virtual Visits (Telemedicine).  Patients are able to view lab/test results, encounter notes, upcoming appointments, etc.  Non-urgent messages can be sent to your provider as well.   To learn more about what you can do with MyChart, go to [email or web address].

## 2024-01-22 NOTE — Progress Notes (Signed)
 Cardiology Office Note:  .   Date:  01/22/2024  ID:  Alec [name], DOB 1960-11-12, MRN [id number] PCP: Rosslyn Coons, MD  [facility] Providers Cardiologist:  Mickiel Albany, MD (Inactive) Cardiology APP:  Tania Familia, NP    History of Present Illness: Alec [name]   Alec [name] is a 63 y.o. male Discussed the use of AI scribe software for clinical note transcription with the patient, who gave verbal consent to proceed.  History of Present Illness [name] is a 63 year old male with coronary artery disease who presents for follow-up.  He has a history of coronary artery disease with a right coronary artery stent placed in 2015. No new symptoms such as chest pain or shortness of breath. He is currently on atorvastatin  80 mg daily, Repatha, Vascepa , aspirin  81 mg daily, and Toprol  XL 25 mg daily. His LDL was reported to be 8 in March, and he continues to manage his condition with these medications.  He has hyperlipidemia and is on Repatha and Vascepa  to manage his cholesterol levels. His LDL was 30 at the last check, and he continues to take atorvastatin  80 mg daily.  He has type 2 diabetes and has been on Mounjaro , which has helped him lose weight. His A1c was around 6, and he reports a weight loss from nearly 300 pounds to 267 pounds, although he notes his weight at home is around 253-254 pounds.  He has obstructive sleep apnea and morbid obesity, which are being managed alongside his other conditions. He has lost weight recently, which may positively impact these conditions.  His blood pressure was 140/96 during the visit, which is higher than usual. He attributes this to stress from work-related audits. He is currently taking Toprol  XL 25 mg daily and ramipril  10 mg daily for blood pressure management.  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      Studies Reviewed: Alec [name]    EKG Interpretation Date/Time:  Wednesday Jan 21 2024 09:27:23 EDT Ventricular Rate:  64 PR Interval:  184 QRS Duration:  86 QT Interval:  388 QTC Calculation: 400 R Axis:   25  Text Interpretation: Normal sinus rhythm Normal ECG When compared with ECG of 21-May-2022 09:03, No significant change was found Confirmed by Alec [name] ([id number]) on 01/21/2024 9:32:49 AM    Results LABS A1c: 6 A1c: 7 LDL: 30 LDL: 8 (11/2023)  DIAGNOSTIC EKG: Normal  Risk Assessment/Calculations:           Physical Exam:   VS:  BP (!) 140/96 (BP Location: Left Arm, Patient Position: Sitting)   Pulse 64   Ht 5\' 11"  (1.803 m)   Wt 267 lb 3.2 oz (121.2 kg)   SpO2 94%   BMI 37.27 kg/m    Wt Readings from Last 3 Encounters:  01/21/24 267 lb 3.2 oz (121.2 kg)  01/10/23 263 lb 10.7 oz (119.6 kg)  08/06/22 263 lb 9.6 oz (119.6 kg)    GEN: Well nourished, well developed in no acute distress NECK: No JVD; No carotid bruits CARDIAC: RRR, no murmurs, no rubs, no gallops RESPIRATORY:  Clear to auscultation without rales, wheezing or rhonchi  ABDOMEN: Soft, non-tender, non-distended EXTREMITIES:  No edema; No deformity   ASSESSMENT AND PLAN: .    Assessment and Plan Assessment & Plan Coronary artery disease with history of RCA  stent Coronary artery disease with right coronary artery stent placement in 2015. No current symptoms of angina or dyspnea. LDL levels are exceptionally low at 8, providing significant protection against myocardial infarction and cerebrovascular accidents. Current medications include atorvastatin , Repatha, Vascepa , and aspirin , effectively managing cholesterol levels. - Continue atorvastatin  80 mg daily - Continue Repatha - Continue Vascepa  - Continue aspirin  81 mg daily  Hyperlipidemia Hyperlipidemia well-controlled with current regimen. LDL levels are exceptionally low at 8, highly protective against cardiovascular events. Current  medications include atorvastatin , Repatha, and Vascepa . - Continue atorvastatin  80 mg daily - Continue Repatha - Continue Vascepa   Hypertension Hypertension with recent office reading of 140/96, likely elevated due to stress from work-related audits. Home blood pressure readings are typically within normal range. Current medications include Toprol  XL and ramipril . - Continue Toprol  XL 25 mg daily - Continue ramipril  10 mg daily - Monitor blood pressure at home regularly - Consider medication adjustment if home readings consistently elevated  Type 2 diabetes mellitus Type 2 diabetes mellitus with an A1c of 7. Previously on Mounjaro  with significant weight loss from approximately 300 pounds to 253-267 pounds. Current management appears effective. - Continue Mounjaro  - Monitor A1c levels regularly  Morbid obesity Morbid obesity with significant weight loss achieved through Mounjaro . Current weight is 253-267 pounds, down from approximately 300 pounds. - Continue Mounjaro  - Encourage continued weight management efforts  Obstructive sleep apnea Obstructive sleep apnea, no specific discussion or changes in management during this visit.  Follow-up Follow-up plan discussed to ensure continuity of care and familiarity with the healthcare team. Coordination of medication refills with primary care physician to streamline process and avoid delays. - Schedule follow-up with a PA in one year - Schedule follow-up with the doctor in two years - Coordinate medication refills with primary care physician, Dr. Mee Spillers           Signed, Alec Gathers, MD

## 2024-01-28 ENCOUNTER — Other Ambulatory Visit (HOSPITAL_BASED_OUTPATIENT_CLINIC_OR_DEPARTMENT_OTHER): Payer: Self-pay

## 2024-01-28 MED ORDER — HYDROCODONE-ACETAMINOPHEN 10-325 MG PO TABS
1.0000 | ORAL_TABLET | Freq: Four times a day (QID) | ORAL | 0 refills | Status: DC | PRN
  Filled 2024-01-28: qty 120, 30d supply, fill #0

## 2024-02-02 ENCOUNTER — Other Ambulatory Visit (HOSPITAL_BASED_OUTPATIENT_CLINIC_OR_DEPARTMENT_OTHER): Payer: Self-pay

## 2024-02-02 ENCOUNTER — Other Ambulatory Visit: Payer: Self-pay

## 2024-02-03 ENCOUNTER — Other Ambulatory Visit (HOSPITAL_BASED_OUTPATIENT_CLINIC_OR_DEPARTMENT_OTHER): Payer: Self-pay

## 2024-02-03 MED ORDER — ACCU-CHEK GUIDE TEST VI STRP
ORAL_STRIP | 3 refills | Status: AC
  Filled 2024-02-03: qty 300, 30d supply, fill #0

## 2024-02-04 ENCOUNTER — Other Ambulatory Visit (HOSPITAL_BASED_OUTPATIENT_CLINIC_OR_DEPARTMENT_OTHER): Payer: Self-pay

## 2024-02-25 ENCOUNTER — Other Ambulatory Visit (HOSPITAL_BASED_OUTPATIENT_CLINIC_OR_DEPARTMENT_OTHER): Payer: Self-pay

## 2024-02-25 ENCOUNTER — Encounter (HOSPITAL_BASED_OUTPATIENT_CLINIC_OR_DEPARTMENT_OTHER): Payer: Self-pay

## 2024-03-01 ENCOUNTER — Other Ambulatory Visit (HOSPITAL_BASED_OUTPATIENT_CLINIC_OR_DEPARTMENT_OTHER): Payer: Self-pay

## 2024-03-01 MED ORDER — VALACYCLOVIR HCL 1 G PO TABS
2000.0000 mg | ORAL_TABLET | Freq: Two times a day (BID) | ORAL | 12 refills | Status: AC | PRN
  Filled 2024-03-01: qty 30, 8d supply, fill #0
  Filled 2024-07-06: qty 30, 8d supply, fill #1

## 2024-03-01 MED ORDER — HYDROCODONE-ACETAMINOPHEN 10-325 MG PO TABS
1.0000 | ORAL_TABLET | Freq: Four times a day (QID) | ORAL | 0 refills | Status: DC | PRN
  Filled 2024-03-01: qty 28, 7d supply, fill #0
  Filled 2024-03-01: qty 120, 30d supply, fill #0
  Filled 2024-03-03: qty 28, 7d supply, fill #1
  Filled 2024-03-08: qty 92, 23d supply, fill #1

## 2024-03-03 ENCOUNTER — Other Ambulatory Visit (HOSPITAL_BASED_OUTPATIENT_CLINIC_OR_DEPARTMENT_OTHER): Payer: Self-pay

## 2024-03-08 ENCOUNTER — Other Ambulatory Visit (HOSPITAL_BASED_OUTPATIENT_CLINIC_OR_DEPARTMENT_OTHER): Payer: Self-pay

## 2024-03-31 ENCOUNTER — Other Ambulatory Visit (HOSPITAL_BASED_OUTPATIENT_CLINIC_OR_DEPARTMENT_OTHER): Payer: Self-pay

## 2024-04-07 ENCOUNTER — Other Ambulatory Visit (HOSPITAL_BASED_OUTPATIENT_CLINIC_OR_DEPARTMENT_OTHER): Payer: Self-pay

## 2024-04-07 MED ORDER — HYDROCODONE-ACETAMINOPHEN 10-325 MG PO TABS
1.0000 | ORAL_TABLET | Freq: Four times a day (QID) | ORAL | 0 refills | Status: DC | PRN
  Filled 2024-04-07: qty 120, 30d supply, fill #0

## 2024-04-09 ENCOUNTER — Encounter: Payer: Self-pay | Admitting: Orthopedic Surgery

## 2024-04-12 ENCOUNTER — Other Ambulatory Visit: Payer: Self-pay | Admitting: Orthopedic Surgery

## 2024-04-12 ENCOUNTER — Encounter: Payer: Self-pay | Admitting: Orthopedic Surgery

## 2024-04-12 DIAGNOSIS — M533 Sacrococcygeal disorders, not elsewhere classified: Secondary | ICD-10-CM

## 2024-04-12 DIAGNOSIS — M545 Low back pain, unspecified: Secondary | ICD-10-CM

## 2024-04-15 ENCOUNTER — Encounter: Payer: Self-pay | Admitting: Orthopedic Surgery

## 2024-04-22 ENCOUNTER — Ambulatory Visit
Admission: RE | Admit: 2024-04-22 | Discharge: 2024-04-22 | Disposition: A | Discharge status: Home / Self Care | Payer: PRIVATE HEALTH INSURANCE | Source: Ambulatory Visit | Attending: Orthopedic Surgery | Admitting: Orthopedic Surgery

## 2024-04-22 DIAGNOSIS — M545 Low back pain, unspecified: Secondary | ICD-10-CM

## 2024-04-29 ENCOUNTER — Other Ambulatory Visit (HOSPITAL_BASED_OUTPATIENT_CLINIC_OR_DEPARTMENT_OTHER): Payer: Self-pay

## 2024-04-29 MED ORDER — GEMTESA 75 MG PO TABS
1.0000 | ORAL_TABLET | Freq: Every day | ORAL | 12 refills | Status: AC
  Filled 2024-04-29: qty 30, 30d supply, fill #0

## 2024-04-29 MED ORDER — ESCITALOPRAM OXALATE 20 MG PO TABS
20.0000 mg | ORAL_TABLET | Freq: Every day | ORAL | 12 refills | Status: AC
  Filled 2024-04-29: qty 90, 90d supply, fill #0

## 2024-04-30 ENCOUNTER — Other Ambulatory Visit (HOSPITAL_BASED_OUTPATIENT_CLINIC_OR_DEPARTMENT_OTHER): Payer: Self-pay

## 2024-05-03 ENCOUNTER — Other Ambulatory Visit (HOSPITAL_BASED_OUTPATIENT_CLINIC_OR_DEPARTMENT_OTHER): Payer: Self-pay

## 2024-05-06 ENCOUNTER — Other Ambulatory Visit (HOSPITAL_BASED_OUTPATIENT_CLINIC_OR_DEPARTMENT_OTHER): Payer: Self-pay

## 2024-05-06 MED ORDER — HYDROCODONE-ACETAMINOPHEN 10-325 MG PO TABS
1.0000 | ORAL_TABLET | Freq: Four times a day (QID) | ORAL | 0 refills | Status: DC | PRN
  Filled 2024-05-06: qty 120, 30d supply, fill #0

## 2024-05-07 ENCOUNTER — Other Ambulatory Visit (HOSPITAL_BASED_OUTPATIENT_CLINIC_OR_DEPARTMENT_OTHER): Payer: Self-pay

## 2024-05-10 ENCOUNTER — Ambulatory Visit
Admission: RE | Admit: 2024-05-10 | Discharge: 2024-05-10 | Disposition: A | Discharge status: Home / Self Care | Source: Ambulatory Visit | Attending: Orthopedic Surgery | Admitting: Orthopedic Surgery

## 2024-05-10 DIAGNOSIS — M533 Sacrococcygeal disorders, not elsewhere classified: Secondary | ICD-10-CM

## 2024-06-02 ENCOUNTER — Other Ambulatory Visit (HOSPITAL_BASED_OUTPATIENT_CLINIC_OR_DEPARTMENT_OTHER): Payer: Self-pay

## 2024-06-07 ENCOUNTER — Other Ambulatory Visit (HOSPITAL_BASED_OUTPATIENT_CLINIC_OR_DEPARTMENT_OTHER): Payer: Self-pay

## 2024-06-07 MED ORDER — HYDROCODONE-ACETAMINOPHEN 10-325 MG PO TABS
1.0000 | ORAL_TABLET | Freq: Four times a day (QID) | ORAL | 0 refills | Status: AC | PRN
  Filled 2024-06-07: qty 120, 30d supply, fill #0

## 2024-06-15 ENCOUNTER — Other Ambulatory Visit (HOSPITAL_BASED_OUTPATIENT_CLINIC_OR_DEPARTMENT_OTHER): Payer: Self-pay

## 2024-07-05 ENCOUNTER — Other Ambulatory Visit (HOSPITAL_BASED_OUTPATIENT_CLINIC_OR_DEPARTMENT_OTHER): Payer: Self-pay

## 2024-07-05 ENCOUNTER — Other Ambulatory Visit (HOSPITAL_COMMUNITY): Payer: Self-pay

## 2024-07-05 MED ORDER — HYDROCODONE-ACETAMINOPHEN 10-325 MG PO TABS
1.0000 | ORAL_TABLET | Freq: Four times a day (QID) | ORAL | 0 refills | Status: AC
  Filled 2024-07-05 (×2): qty 120, 30d supply, fill #0

## 2024-07-14 ENCOUNTER — Other Ambulatory Visit: Payer: Self-pay

## 2024-07-14 ENCOUNTER — Other Ambulatory Visit (HOSPITAL_BASED_OUTPATIENT_CLINIC_OR_DEPARTMENT_OTHER): Payer: Self-pay

## 2024-07-14 MED ORDER — INSULIN REGULAR HUMAN (CONC) 500 UNIT/ML ~~LOC~~ SOLN
150.0000 [IU] | Freq: Three times a day (TID) | SUBCUTANEOUS | 12 refills | Status: AC
  Filled 2024-07-14 – 2024-07-16 (×2): qty 60, 67d supply, fill #0
  Filled 2024-09-30: qty 60, 67d supply, fill #1

## 2024-07-15 ENCOUNTER — Other Ambulatory Visit (HOSPITAL_BASED_OUTPATIENT_CLINIC_OR_DEPARTMENT_OTHER): Payer: Self-pay

## 2024-07-16 ENCOUNTER — Other Ambulatory Visit (HOSPITAL_BASED_OUTPATIENT_CLINIC_OR_DEPARTMENT_OTHER): Payer: Self-pay

## 2024-07-19 ENCOUNTER — Other Ambulatory Visit (HOSPITAL_BASED_OUTPATIENT_CLINIC_OR_DEPARTMENT_OTHER): Payer: Self-pay

## 2024-07-21 ENCOUNTER — Other Ambulatory Visit (HOSPITAL_COMMUNITY): Payer: Self-pay

## 2024-08-04 ENCOUNTER — Other Ambulatory Visit (HOSPITAL_BASED_OUTPATIENT_CLINIC_OR_DEPARTMENT_OTHER): Payer: Self-pay

## 2024-08-04 MED ORDER — HYDROCODONE-ACETAMINOPHEN 10-325 MG PO TABS
1.0000 | ORAL_TABLET | Freq: Four times a day (QID) | ORAL | 0 refills | Status: DC | PRN
  Filled 2024-08-04: qty 120, 30d supply, fill #0

## 2024-08-30 ENCOUNTER — Other Ambulatory Visit (HOSPITAL_BASED_OUTPATIENT_CLINIC_OR_DEPARTMENT_OTHER): Payer: Self-pay

## 2024-08-30 ENCOUNTER — Other Ambulatory Visit: Payer: Self-pay

## 2024-08-30 MED ORDER — AMOXICILLIN-POT CLAVULANATE 875-125 MG PO TABS
1.0000 | ORAL_TABLET | Freq: Two times a day (BID) | ORAL | 0 refills | Status: AC
  Filled 2024-08-30: qty 14, 7d supply, fill #0

## 2024-08-30 MED ORDER — HYDROCODONE-ACETAMINOPHEN 10-325 MG PO TABS
1.0000 | ORAL_TABLET | Freq: Four times a day (QID) | ORAL | 0 refills | Status: DC | PRN
  Filled 2024-08-30 – 2024-09-02 (×2): qty 120, 30d supply, fill #0

## 2024-08-30 MED ORDER — PREGABALIN 50 MG PO CAPS
50.0000 mg | ORAL_CAPSULE | Freq: Two times a day (BID) | ORAL | 5 refills | Status: AC
  Filled 2024-08-30: qty 60, 30d supply, fill #0
  Filled 2024-09-30: qty 60, 30d supply, fill #1

## 2024-09-02 ENCOUNTER — Other Ambulatory Visit (HOSPITAL_COMMUNITY): Payer: Self-pay

## 2024-09-02 ENCOUNTER — Other Ambulatory Visit (HOSPITAL_BASED_OUTPATIENT_CLINIC_OR_DEPARTMENT_OTHER): Payer: Self-pay

## 2024-09-30 ENCOUNTER — Other Ambulatory Visit (HOSPITAL_BASED_OUTPATIENT_CLINIC_OR_DEPARTMENT_OTHER): Payer: Self-pay

## 2024-09-30 ENCOUNTER — Other Ambulatory Visit: Payer: Self-pay

## 2024-10-01 ENCOUNTER — Other Ambulatory Visit (HOSPITAL_COMMUNITY): Payer: Self-pay

## 2024-10-01 ENCOUNTER — Other Ambulatory Visit: Payer: Self-pay

## 2024-10-01 ENCOUNTER — Other Ambulatory Visit (HOSPITAL_BASED_OUTPATIENT_CLINIC_OR_DEPARTMENT_OTHER): Payer: Self-pay

## 2024-10-01 MED ORDER — HYDROCODONE-ACETAMINOPHEN 10-325 MG PO TABS
1.0000 | ORAL_TABLET | Freq: Four times a day (QID) | ORAL | 0 refills | Status: AC | PRN
  Filled 2024-10-01: qty 120, 30d supply, fill #0

## 2024-10-04 ENCOUNTER — Other Ambulatory Visit (HOSPITAL_BASED_OUTPATIENT_CLINIC_OR_DEPARTMENT_OTHER): Payer: Self-pay

## 2024-10-11 ENCOUNTER — Other Ambulatory Visit (HOSPITAL_BASED_OUTPATIENT_CLINIC_OR_DEPARTMENT_OTHER): Payer: Self-pay

## 2024-10-12 ENCOUNTER — Other Ambulatory Visit (HOSPITAL_BASED_OUTPATIENT_CLINIC_OR_DEPARTMENT_OTHER): Payer: Self-pay

## 2024-10-12 MED ORDER — REPATHA SURECLICK 140 MG/ML ~~LOC~~ SOAJ
SUBCUTANEOUS | 4 refills | Status: AC
  Filled 2024-10-12: qty 6, 84d supply, fill #0

## 2024-10-13 ENCOUNTER — Other Ambulatory Visit (HOSPITAL_BASED_OUTPATIENT_CLINIC_OR_DEPARTMENT_OTHER): Payer: Self-pay

## 2024-10-14 ENCOUNTER — Other Ambulatory Visit (HOSPITAL_BASED_OUTPATIENT_CLINIC_OR_DEPARTMENT_OTHER): Payer: Self-pay

## 2024-10-18 ENCOUNTER — Emergency Department (HOSPITAL_BASED_OUTPATIENT_CLINIC_OR_DEPARTMENT_OTHER): Admission: EM | Admit: 2024-10-18 | Discharge: 2024-10-19 | Disposition: A

## 2024-10-18 ENCOUNTER — Other Ambulatory Visit (HOSPITAL_BASED_OUTPATIENT_CLINIC_OR_DEPARTMENT_OTHER): Payer: Self-pay

## 2024-10-18 ENCOUNTER — Other Ambulatory Visit: Payer: Self-pay

## 2024-10-18 ENCOUNTER — Encounter (HOSPITAL_BASED_OUTPATIENT_CLINIC_OR_DEPARTMENT_OTHER): Payer: Self-pay

## 2024-10-18 DIAGNOSIS — M5442 Lumbago with sciatica, left side: Secondary | ICD-10-CM | POA: Insufficient documentation

## 2024-10-18 DIAGNOSIS — M5441 Lumbago with sciatica, right side: Secondary | ICD-10-CM | POA: Insufficient documentation

## 2024-10-18 DIAGNOSIS — R531 Weakness: Secondary | ICD-10-CM | POA: Insufficient documentation

## 2024-10-18 DIAGNOSIS — Z794 Long term (current) use of insulin: Secondary | ICD-10-CM | POA: Insufficient documentation

## 2024-10-18 DIAGNOSIS — R32 Unspecified urinary incontinence: Secondary | ICD-10-CM | POA: Insufficient documentation

## 2024-10-18 DIAGNOSIS — Z7982 Long term (current) use of aspirin: Secondary | ICD-10-CM | POA: Insufficient documentation

## 2024-10-18 LAB — CBC WITH DIFFERENTIAL/PLATELET
Abs Immature Granulocytes: 0.11 10*3/uL — ABNORMAL HIGH (ref 0.00–0.07)
Basophils Absolute: 0.1 10*3/uL (ref 0.0–0.1)
Basophils Relative: 1 %
Eosinophils Absolute: 0.1 10*3/uL (ref 0.0–0.5)
Eosinophils Relative: 1 %
HCT: 45.3 % (ref 39.0–52.0)
Hemoglobin: 15.7 g/dL (ref 13.0–17.0)
Immature Granulocytes: 1 %
Lymphocytes Relative: 37 %
Lymphs Abs: 4.4 10*3/uL — ABNORMAL HIGH (ref 0.7–4.0)
MCH: 31 pg (ref 26.0–34.0)
MCHC: 34.7 g/dL (ref 30.0–36.0)
MCV: 89.5 fL (ref 80.0–100.0)
Monocytes Absolute: 0.8 10*3/uL (ref 0.1–1.0)
Monocytes Relative: 7 %
Neutro Abs: 6.4 10*3/uL (ref 1.7–7.7)
Neutrophils Relative %: 53 %
Platelets: 272 10*3/uL (ref 150–400)
RBC: 5.06 MIL/uL (ref 4.22–5.81)
RDW: 12.4 % (ref 11.5–15.5)
WBC: 12 10*3/uL — ABNORMAL HIGH (ref 4.0–10.5)
nRBC: 0 % (ref 0.0–0.2)

## 2024-10-18 LAB — COMPREHENSIVE METABOLIC PANEL WITH GFR
ALT: 28 U/L (ref 0–44)
AST: 28 U/L (ref 15–41)
Albumin: 3.7 g/dL (ref 3.5–5.0)
Alkaline Phosphatase: 64 U/L (ref 38–126)
Anion gap: 12 (ref 5–15)
BUN: 19 mg/dL (ref 8–23)
CO2: 24 mmol/L (ref 22–32)
Calcium: 9.5 mg/dL (ref 8.9–10.3)
Chloride: 100 mmol/L (ref 98–111)
Creatinine, Ser: 0.82 mg/dL (ref 0.61–1.24)
GFR, Estimated: >60 mL/min
Glucose, Bld: 105 mg/dL — ABNORMAL HIGH (ref 70–99)
Potassium: 3.9 mmol/L (ref 3.5–5.1)
Sodium: 137 mmol/L (ref 135–145)
Total Bilirubin: 0.4 mg/dL (ref 0.0–1.2)
Total Protein: 7.4 g/dL (ref 6.5–8.1)

## 2024-10-18 MED ORDER — HUMULIN R U-500 KWIKPEN 500 UNIT/ML ~~LOC~~ SOPN
200.0000 [IU] | PEN_INJECTOR | Freq: Every day | SUBCUTANEOUS | 11 refills | Status: AC
  Filled 2024-10-18: qty 18, 45d supply, fill #0

## 2024-10-19 ENCOUNTER — Encounter (HOSPITAL_COMMUNITY): Payer: Self-pay

## 2024-10-19 ENCOUNTER — Emergency Department (HOSPITAL_COMMUNITY)

## 2024-10-19 MED ORDER — LORAZEPAM 2 MG/ML IJ SOLN
1.0000 mg | Freq: Once | INTRAMUSCULAR | Status: AC
  Administered 2024-10-19: 1 mg via INTRAVENOUS
  Filled 2024-10-19: qty 1

## 2024-10-19 MED ORDER — HYDROCODONE-ACETAMINOPHEN 10-325 MG PO TABS
1.0000 | ORAL_TABLET | Freq: Once | ORAL | Status: AC
  Administered 2024-10-19: 1 via ORAL
  Filled 2024-10-19: qty 1

## 2024-10-19 NOTE — Discharge Instructions (Signed)
 Follow-up with Dr. Dumonski as soon as possible.  Return to the emergency department for any new or worsening symptoms of concern.

## 2024-10-19 NOTE — ED Triage Notes (Signed)
 BIB [facility] from [facility] for MRI. Pt c/o incontinence of bowel and bladder for approx. 1 month that has been getting worse over the past 3 days. 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

## 2024-10-19 NOTE — ED Notes (Signed)
 Patient transported to MRI at this time.

## 2024-10-19 NOTE — ED Notes (Signed)
"  Patient back in ED  "
